# Patient Record
Sex: Male | Born: 1960
Health system: Southern US, Community
[De-identification: ages and names within clinical notes are randomized; demographics above are authoritative.]

## PROBLEM LIST (undated history)

## (undated) DIAGNOSIS — F99 Mental disorder, not otherwise specified: Secondary | ICD-10-CM

## (undated) DIAGNOSIS — I350 Nonrheumatic aortic (valve) stenosis: Secondary | ICD-10-CM

## (undated) DIAGNOSIS — I82409 Acute embolism and thrombosis of unspecified deep veins of unspecified lower extremity: Secondary | ICD-10-CM

## (undated) DIAGNOSIS — I259 Chronic ischemic heart disease, unspecified: Secondary | ICD-10-CM

## (undated) DIAGNOSIS — I2510 Atherosclerotic heart disease of native coronary artery without angina pectoris: Secondary | ICD-10-CM

## (undated) DIAGNOSIS — D6851 Activated protein C resistance: Secondary | ICD-10-CM

## (undated) DIAGNOSIS — J45909 Unspecified asthma, uncomplicated: Secondary | ICD-10-CM

## (undated) DIAGNOSIS — G9050 Complex regional pain syndrome I, unspecified: Secondary | ICD-10-CM

## (undated) DIAGNOSIS — K279 Peptic ulcer, site unspecified, unspecified as acute or chronic, without hemorrhage or perforation: Secondary | ICD-10-CM

## (undated) DIAGNOSIS — I10 Essential (primary) hypertension: Secondary | ICD-10-CM

## (undated) DIAGNOSIS — R945 Abnormal results of liver function studies: Secondary | ICD-10-CM

## (undated) DIAGNOSIS — R7989 Other specified abnormal findings of blood chemistry: Secondary | ICD-10-CM

## (undated) DIAGNOSIS — D649 Anemia, unspecified: Secondary | ICD-10-CM

## (undated) HISTORY — DX: Activated protein C resistance: D68.51

## (undated) HISTORY — DX: Peptic ulcer, site unspecified, unspecified as acute or chronic, without hemorrhage or perforation: K27.9

## (undated) HISTORY — DX: Essential (primary) hypertension: I10

## (undated) HISTORY — DX: Acute embolism and thrombosis of unspecified deep veins of unspecified lower extremity: I82.409

## (undated) HISTORY — PX: CORONARY ARTERY BYPASS GRAFT: SHX141

## (undated) HISTORY — DX: Complex regional pain syndrome I, unspecified: G90.50

## (undated) HISTORY — DX: Atherosclerotic heart disease of native coronary artery without angina pectoris: I25.10

## (undated) HISTORY — DX: Mental disorder, not otherwise specified: F99

## (undated) HISTORY — DX: Chronic ischemic heart disease, unspecified: I25.9

## (undated) HISTORY — DX: Abnormal results of liver function studies: R94.5

## (undated) HISTORY — DX: Unspecified asthma, uncomplicated: J45.909

## (undated) HISTORY — DX: Other specified abnormal findings of blood chemistry: R79.89

## (undated) HISTORY — PX: OTHER SURGICAL HISTORY: SHX169

## (undated) HISTORY — DX: Nonrheumatic aortic (valve) stenosis: I35.0

## (undated) HISTORY — DX: Anemia, unspecified: D64.9

---

## 2004-10-28 DIAGNOSIS — I82409 Acute embolism and thrombosis of unspecified deep veins of unspecified lower extremity: Secondary | ICD-10-CM

## 2004-10-28 HISTORY — DX: Acute embolism and thrombosis of unspecified deep veins of unspecified lower extremity: I82.409

## 2006-10-28 HISTORY — PX: GALLBLADDER SURGERY: SHX652

## 2011-07-22 DIAGNOSIS — Z86711 Personal history of pulmonary embolism: Secondary | ICD-10-CM | POA: Insufficient documentation

## 2011-10-17 DIAGNOSIS — D61818 Other pancytopenia: Secondary | ICD-10-CM | POA: Insufficient documentation

## 2011-10-25 DIAGNOSIS — D500 Iron deficiency anemia secondary to blood loss (chronic): Secondary | ICD-10-CM | POA: Insufficient documentation

## 2011-12-26 DIAGNOSIS — E610 Copper deficiency: Secondary | ICD-10-CM

## 2012-05-06 DIAGNOSIS — Z9889 Other specified postprocedural states: Secondary | ICD-10-CM | POA: Insufficient documentation

## 2012-07-15 HISTORY — PX: AORTIC VALVE REPLACEMENT: SHX41

## 2012-07-21 DIAGNOSIS — Z7901 Long term (current) use of anticoagulants: Secondary | ICD-10-CM | POA: Insufficient documentation

## 2012-07-23 DIAGNOSIS — Z951 Presence of aortocoronary bypass graft: Secondary | ICD-10-CM | POA: Insufficient documentation

## 2012-08-17 ENCOUNTER — Encounter: Payer: Self-pay | Admitting: Cardiology

## 2012-08-17 ENCOUNTER — Ambulatory Visit (INDEPENDENT_AMBULATORY_CARE_PROVIDER_SITE_OTHER): Payer: Self-pay | Admitting: Cardiology

## 2012-08-17 VITALS — BP 110/62 | HR 64 | Ht 68.5 in | Wt 167.0 lb

## 2012-08-17 DIAGNOSIS — I4891 Unspecified atrial fibrillation: Secondary | ICD-10-CM

## 2012-08-17 DIAGNOSIS — Z951 Presence of aortocoronary bypass graft: Secondary | ICD-10-CM

## 2012-08-17 DIAGNOSIS — Z954 Presence of other heart-valve replacement: Secondary | ICD-10-CM

## 2012-08-17 DIAGNOSIS — I483 Typical atrial flutter: Secondary | ICD-10-CM | POA: Insufficient documentation

## 2012-08-17 DIAGNOSIS — I25810 Atherosclerosis of coronary artery bypass graft(s) without angina pectoris: Secondary | ICD-10-CM

## 2012-08-17 DIAGNOSIS — D682 Hereditary deficiency of other clotting factors: Secondary | ICD-10-CM | POA: Insufficient documentation

## 2012-08-17 DIAGNOSIS — Z952 Presence of prosthetic heart valve: Secondary | ICD-10-CM | POA: Insufficient documentation

## 2012-08-17 DIAGNOSIS — D6859 Other primary thrombophilia: Secondary | ICD-10-CM | POA: Insufficient documentation

## 2012-08-17 DIAGNOSIS — I359 Nonrheumatic aortic valve disorder, unspecified: Secondary | ICD-10-CM

## 2012-08-17 NOTE — Patient Instructions (Addendum)
Reduce ASA to 81 mg daily  Stop amiodarone.  We will schedule you for an echocardiogram  You need routine SBE prophylaxis/ antibiotics for dental work  We will refer to Cardiac Rehab.

## 2012-08-17 NOTE — Progress Notes (Signed)
Frederick Payne Date of Birth: December 23, 1960 Medical Record #[id number]  History of Present Illness:  Frederick Payne is seen at the request of Dr. Gracy Bruins in [place] for evaluation of aortic valve disease. Frederick Payne is a pleasant 51 year old white male who has a complex medical history. He has a known history of aortic valve disease dating back at least 15 years. This past year he began experiencing symptoms of increasing dyspnea, fatigue, and then developed syncope. He was found to have severe aortic stenosis and aortic insufficiency. He was ultimately evaluated at [facility] in [facility] and underwent aortic valve replacement with a #27 mm St. Jude valve and single coronary bypass surgery. He also had some debridement of his mitral annulus which was calcified. Following surgery he had some postoperative atrial fibrillation. This resolved with amiodarone therapy. He had a single LIMA graft to the LAD for a 70% stenosis. He was started on Coumadin. He is still being bridged with Lovenox. His Coumadin is being followed by [facility]. Patient reports he is making a good recovery. He still has not regained his physical energy. He denies any significant chest pain, shortness of breath, or palpitations. He has had no recurrent syncopal episodes.  Current Outpatient Prescriptions on File Prior to Visit  Medication Sig Dispense Refill  . amiodarone (PACERONE) 200 MG tablet Take 200 mg by mouth daily.      [name] aspirin 325 MG tablet Take 325 mg by mouth daily.      . Copper Gluconate (COPPER CAPS) 2 MG CAPS Take by mouth every evening.      . docusate sodium (COLACE) 100 MG capsule Take 100 mg by mouth 2 (two) times daily.      [name] enoxaparin (LOVENOX) 80 MG/0.8ML injection Inject into the skin.      . ferrous sulfate 325 (65 FE) MG tablet Take 325 mg by mouth daily.      [name] gabapentin (NEURONTIN) 600 MG tablet Take 600 mg by mouth 4 (four) times daily.      [name]  L-Methylfolate-Algae (DEPLIN 15 PO) Take by mouth 2 (two) times daily.      [name] levalbuterol (XOPENEX HFA) 45 MCG/ACT inhaler Inhale 1 puff into the lungs every 4 (four) hours as needed.      . metoprolol succinate (TOPROL-XL) 50 MG 24 hr tablet Take 50 mg by mouth daily. 1.5 tablet once a day in the evening      . morphine (KADIAN) 60 MG 24 hr capsule Take 60 mg by mouth as directed. 1 capsule by mouth three times a day      . Multiple Vitamins-Minerals (ONE-A-DAY MENS 50+ ADVANTAGE PO) Take by mouth daily.      . ondansetron (ZOFRAN) 4 MG tablet Take 4 mg by mouth daily.      [name] oxyCODONE (ROXICODONE) 15 MG immediate release tablet Take 15 mg by mouth as directed.      . Polyethylene Glycol 3350 POWD by Does not apply route daily.      . S-Adenosylmethionine (SAM-E) 400 MG TABS Take by mouth every morning.      . Vilazodone HCl (VIIBRYD) 40 MG TABS Take by mouth daily.      [name] warfarin (COUMADIN) 5 MG tablet Take 5 mg by mouth daily.      . LamoTRIgine 50 MG TB24 Take 50 mg by mouth every evening.        Allergies  Allergen Reactions  . Contrast Media (Iodinated Diagnostic Agents)   .  Pectin   . Shellfish Allergy   . Sulfa Antibiotics   . Tylenol (Acetaminophen)     Past Medical History  Diagnosis Date  . Hypertension   . Psychiatric disorder     Underlying  . Deep vein thrombosis 2006    After a broken ankle--recently diagnosed with reflex sympathetic dystrophy of the left lower extremity  . Aortic stenosis     Had surgery performed  . Ischemic heart disease   . CAD (coronary artery disease)   . Factor V Leiden   . Chronic anemia   . Reflex sympathetic dystrophy   . PUD (peptic ulcer disease)     with bleeding    Past Surgical History  Procedure Date  . Gallbladder surgery 2008  . Aortic valve replacement 07/15/12    27 mm St Jude  . Coronary artery bypass graft     x1  . Gastrectomy and vagotomy   . Greenfield filter     History  Smoking status  . Former Smoker    Smokeless tobacco  . Not on file  Comment: quit 15 + yrs ago    History  Alcohol Use No    Family History  Problem Relation Age of Onset  . Stroke    . Heart attack    . Hypertension    . Factor V Leiden deficiency      family history     Review of Systems: The review of systems is positive for history of extensive left lower extremity DVT in the past. He reports a factor V Leiden deficiency. He had refused Coumadin therapy in the past. He has a Greenfield filter in place. With his DVT he developed a reflex sympathetic dystrophy and has had significant pain in his left lower extremity. He is followed in a pain clinic. He also has a history of significant peptic ulcer disease with prior gastrointestinal bleeding. He's had previous gastrectomy. He has chronic anemia that has required transfusion and iron repletion in the past. He is followed by hematologist. He has been diagnosed with both a Copper and vitamin B deficiency. He apparently has some history of psychiatric illness. He is disabled from his chronic pain. All other systems were reviewed and are negative.  Physical Exam: BP 110/62  Pulse 64  Ht 5' 8.5" (1.74 m)  Wt 75.751 kg (167 lb)  BMI 25.02 kg/m2 He is a pleasant, thin white male in no acute distress. Skin is warm and dry. HEENT exam is unremarkable. Pupils are equal round and reactive to light accommodation. Extraocular movements are full. Sclera are clear. Oropharynx is clear. Dentition is in good repair. Neck is supple without JVD, adenopathy, thyromegaly, or bruits. Carotid upstrokes are good. Lungs are clear. Cardiac exam reveals a regular rate and rhythm without gallop or murmur. He has a good mechanical aortic valve click. Sternal incision is healing well and sternum is stable. Abdomen is soft and nontender with old gastrectomy scar. There are no masses or bruits. Femoral and pedal pulses are 2+ and symmetric. He has no significant edema. He does have significant  discomfort to palpation of his left lower extremity. He is alert and oriented x3. Cranial nerves II through XII are intact. Mood is appropriate. LABORATORY DATA: ECG today demonstrates sinus bradycardia with rate 56 beats per minute. He has a first degree AV block. There are T wave changes consistent with inferior and anterior lateral ischemia.  Assessment / Plan: 1. Aortic valve disease with severe aortic stenosis and insufficiency.  Now status post aortic valve replacement with #27 mm St. Jude mechanical valve. Postoperative recovery is good. He will need to stay on lifelong Coumadin. He will need routine SBE  prophylaxis. We will obtain an echocardiogram as a new baseline. We will reduce his aspirin to 81 mg daily.  2. Coronary disease status post single vessel LIMA graft to the LAD. I recommended formal cardiac rehabilitation.  3. History of DVT with factor V Leiden deficiency.  4. Reflex sympathetic dystrophy left lower extremity.  5. Chronic anemia.  6. History of peptic ulcer disease status post gastrectomy.  7. Postoperative atrial fibrillation-resolved. We will stop amiodarone at this point and observe.  8. History of syncope. I suspect this was related to his significant aortic valve disease. I recommended that he has no further syncope he may resume driving in 2 months.

## 2012-08-25 ENCOUNTER — Ambulatory Visit (HOSPITAL_COMMUNITY): Payer: Self-pay | Attending: Cardiology | Admitting: Radiology

## 2012-08-25 DIAGNOSIS — I379 Nonrheumatic pulmonary valve disorder, unspecified: Secondary | ICD-10-CM | POA: Insufficient documentation

## 2012-08-25 DIAGNOSIS — I4891 Unspecified atrial fibrillation: Secondary | ICD-10-CM | POA: Insufficient documentation

## 2012-08-25 DIAGNOSIS — I369 Nonrheumatic tricuspid valve disorder, unspecified: Secondary | ICD-10-CM | POA: Insufficient documentation

## 2012-08-25 DIAGNOSIS — I2510 Atherosclerotic heart disease of native coronary artery without angina pectoris: Secondary | ICD-10-CM | POA: Insufficient documentation

## 2012-08-25 DIAGNOSIS — I10 Essential (primary) hypertension: Secondary | ICD-10-CM | POA: Insufficient documentation

## 2012-08-25 DIAGNOSIS — I359 Nonrheumatic aortic valve disorder, unspecified: Secondary | ICD-10-CM

## 2012-08-25 DIAGNOSIS — I25810 Atherosclerosis of coronary artery bypass graft(s) without angina pectoris: Secondary | ICD-10-CM

## 2012-08-25 NOTE — Progress Notes (Signed)
Echocardiogram performed.  

## 2012-08-26 NOTE — Addendum Note (Signed)
Addended by: Reine Just on: 08/26/2012 04:29 PM   Modules accepted: Orders

## 2012-08-27 ENCOUNTER — Encounter: Payer: Self-pay | Admitting: Cardiology

## 2012-08-31 ENCOUNTER — Telehealth: Payer: Self-pay | Admitting: Cardiology

## 2012-08-31 NOTE — Telephone Encounter (Signed)
Patient would like to know when he can travel out of the country. Pt had open heart surgery on september 17 th 2013. Pt is planning to travel on January 2014.

## 2012-08-31 NOTE — Telephone Encounter (Signed)
New problem:  When can he travel out of country?

## 2012-08-31 NOTE — Telephone Encounter (Signed)
He should be fine to travel out of the country in Jan.  Peter [place] MD, [facility]

## 2012-09-01 NOTE — Telephone Encounter (Signed)
Patient called was told okay with Dr.Jordan to travel out of country in 1/14.

## 2012-09-09 DIAGNOSIS — Z7901 Long term (current) use of anticoagulants: Secondary | ICD-10-CM

## 2012-09-09 DIAGNOSIS — Z5181 Encounter for therapeutic drug level monitoring: Secondary | ICD-10-CM | POA: Insufficient documentation

## 2012-09-15 ENCOUNTER — Telehealth: Payer: Self-pay | Admitting: Cardiology

## 2012-09-15 NOTE — Telephone Encounter (Signed)
Pt had valve replaced and has some questions

## 2012-09-15 NOTE — Telephone Encounter (Signed)
Spoke to patient he stated he is having trouble sleeping at night.States he is not use to his new valve, makes too much noise.Advised to call PCP for a sleeping medication.

## 2012-09-16 ENCOUNTER — Telehealth: Payer: Self-pay | Admitting: Cardiology

## 2012-09-16 ENCOUNTER — Ambulatory Visit: Payer: Self-pay | Admitting: Cardiology

## 2012-09-16 NOTE — Telephone Encounter (Signed)
Pt having a dull pain in his chest since yesterday and he is not sure if it is from his cough or what

## 2012-09-16 NOTE — Telephone Encounter (Signed)
See other phone note from Deliah Goody RN

## 2012-09-16 NOTE — Telephone Encounter (Signed)
Spoke with pt, he has been congested for several weeks. He has had a cough for 3-4 weeks, productive of milk colored to green- brown sputum. He has a constant dull pain in his chest since yesterday that is severe when he coughs. He is coughing over the phone and is wheezing. Pt instructed to call PCP to be seen for antibiotics. Pt agreed with this plan.

## 2012-10-06 ENCOUNTER — Other Ambulatory Visit: Payer: Self-pay | Admitting: *Deleted

## 2012-10-06 MED ORDER — METOPROLOL SUCCINATE ER 50 MG PO TB24: 50.0000 mg | ORAL_TABLET | Freq: Every day | ORAL | Status: DC

## 2012-11-18 ENCOUNTER — Ambulatory Visit (INDEPENDENT_AMBULATORY_CARE_PROVIDER_SITE_OTHER): Payer: Self-pay | Admitting: Cardiology

## 2012-11-18 ENCOUNTER — Encounter: Payer: Self-pay | Admitting: Cardiology

## 2012-11-18 VITALS — BP 122/78 | HR 74 | Ht 68.5 in | Wt 156.0 lb

## 2012-11-18 DIAGNOSIS — Z951 Presence of aortocoronary bypass graft: Secondary | ICD-10-CM

## 2012-11-18 DIAGNOSIS — Z952 Presence of prosthetic heart valve: Secondary | ICD-10-CM

## 2012-11-18 DIAGNOSIS — I25810 Atherosclerosis of coronary artery bypass graft(s) without angina pectoris: Secondary | ICD-10-CM

## 2012-11-18 DIAGNOSIS — Z954 Presence of other heart-valve replacement: Secondary | ICD-10-CM

## 2012-11-18 DIAGNOSIS — I4891 Unspecified atrial fibrillation: Secondary | ICD-10-CM

## 2012-11-18 NOTE — Progress Notes (Signed)
Micheal Cifelli Date of Birth: [birth year] Medical Record #[id number]  History of Present Illness:  Mr. Rubiano is seen for follow up of AV disease and CAD. He is s/p aortic valve replacement with a #27 mm St. Jude valve and single coronary bypass surgery. He also had some debridement of his mitral annulus which was calcified. Following surgery he had some postoperative atrial fibrillation. This resolved with amiodarone therapy. He had a single LIMA graft to the LAD for a 70% stenosis. He has made an excellent recovery. Amiodarone was stopped on his last visit. He reports his coumadin has been therapeutic. Feels HR is elevated in am. He does have chronic PVCs and is on metoprolol.  Current Outpatient Prescriptions on File Prior to Visit  Medication Sig Dispense Refill  . Copper Gluconate (COPPER CAPS) 2 MG CAPS Take by mouth every evening.      . ferrous sulfate 325 (65 FE) MG tablet Take 325 mg by mouth daily.      . fish oil-omega-3 fatty acids 1000 MG capsule Take 1 g by mouth daily.      [name] gabapentin (NEURONTIN) 600 MG tablet Take 600 mg by mouth 4 (four) times daily.      [name] L-Methylfolate-Algae (DEPLIN 15 PO) Take by mouth 2 (two) times daily.      [name] levalbuterol (XOPENEX HFA) 45 MCG/ACT inhaler Inhale 1 puff into the lungs every 4 (four) hours as needed.      . metoprolol succinate (TOPROL-XL) 50 MG 24 hr tablet Take 1 tablet (50 mg total) by mouth daily. 1.5 tablet once a day in the evening  45 tablet  6  . morphine (KADIAN) 60 MG 24 hr capsule Take 60 mg by mouth as directed. 1 capsule by mouth three times a day      . Multiple Vitamins-Minerals (ONE-A-DAY MENS 50+ ADVANTAGE PO) Take by mouth daily.      . ondansetron (ZOFRAN) 4 MG tablet Take 4 mg by mouth daily.      [name] oxyCODONE (ROXICODONE) 15 MG immediate release tablet Take 15 mg by mouth as directed.      [name] POLICOSANOL PO Take 20 mg by mouth daily.      . Polyethylene Glycol 3350 POWD by Does not apply route daily.      .  S-Adenosylmethionine (SAM-E) 400 MG TABS Take by mouth every morning.      . Vilazodone HCl (VIIBRYD) 40 MG TABS Take by mouth daily.      [name] warfarin (COUMADIN) 5 MG tablet Take 5 mg by mouth daily.        Allergies  Allergen Reactions  . Contrast Media (Iodinated Diagnostic Agents)   . Pectin   . Shellfish Allergy   . Sulfa Antibiotics   . Tylenol (Acetaminophen)     Past Medical History  Diagnosis Date  . Hypertension   . Psychiatric disorder     Underlying  . Deep vein thrombosis 2006    After a broken ankle--recently diagnosed with reflex sympathetic dystrophy of the left lower extremity  . Aortic stenosis     Had surgery performed  . Ischemic heart disease   . CAD (coronary artery disease)   . Factor V Leiden   . Chronic anemia   . Reflex sympathetic dystrophy   . PUD (peptic ulcer disease)     with bleeding    Past Surgical History  Procedure Date  . Gallbladder surgery 2008  . Aortic valve replacement 07/15/12    27  mm St Jude  . Coronary artery bypass graft     x1  . Gastrectomy and vagotomy   . Greenfield filter     History  Smoking status  . Former Smoker  Smokeless tobacco  . Not on file    Comment: quit 15 + yrs ago    History  Alcohol Use No    Family History  Problem Relation Age of Onset  . Stroke    . Heart attack    . Hypertension    . Factor V Leiden deficiency      family history     Review of Systems: The review of systems is positive for history of extensive left lower extremity DVT in the past. He reports a factor V Leiden deficiency. He complains of insomnia due to night terrors. 2 months ago he stopped his morphine which he had taken for RSD. He still takes oxycontin twice a day. All other systems were reviewed and are negative.  Physical Exam: BP 122/78  Pulse 74  Ht 5' 8.5" (1.74 m)  Wt 156 lb (70.761 kg)  BMI 23.37 kg/m2 He is a pleasant, thin white male in no acute distress. Skin is warm and dry. HEENT exam is  unremarkable. Pupils are equal round and reactive to light accommodation. Extraocular movements are full. Sclera are clear. Oropharynx is clear. Dentition is in good repair. Neck is supple without JVD, adenopathy, thyromegaly, or bruits. Carotid upstrokes are good. Lungs are clear. Cardiac exam reveals a regular rate and rhythm without gallop or murmur. He has a good mechanical aortic valve click. Sternal incision is healing well and sternum is stable. Abdomen is soft and nontender with old gastrectomy scar. There are no masses or bruits. Femoral and pedal pulses are 2+ and symmetric. He has no significant edema. He does have significant discomfort to palpation of his left lower extremity. He is alert and oriented x3. Cranial nerves II through XII are intact. Mood is appropriate. LABORATORY DATA: Transthoracic Echocardiography  Patient: Equan, Eisenzimmer MR #: [id number] Study Date: 08/25/2012 Gender: M Age: 52 Height: 172.7cm Weight: 75.8kg BSA: 1.10m^2 Pt. Status: Room:  ATTENDING [place], Mark Hassey ORDERING [place], Yer Olivencia REFERRING [place], Roquel Burgin SONOGRAPHER Luvenia Redden, RDCS PERFORMING [facility], [facility] cc: DR. Gracy Bruins in [place]  ------------------------------------------------------------ LV EF: 55% - 60%  ------------------------------------------------------------ Indications: 424.1 Aortic valve disorders.  ------------------------------------------------------------ History: PMH: Acquired from the patient and from the patient's chart. Atrial fibrillation. Coronary artery disease. Risk factors: Hypertension.  ------------------------------------------------------------ Study Conclusions  - Left ventricle: The cavity size was normal. Wall thickness was normal. Systolic function was normal. The estimated ejection fraction was in the range of 55% to 60%. Wall motion was normal; there were no regional wall motion abnormalities. Features are consistent with a  pseudonormal left ventricular filling pattern, with concomitant abnormal relaxation and increased filling pressure (grade 2 diastolic dysfunction). - Aortic valve: A mechanical prosthesis was present. - Left atrium: The atrium was mildly dilated.  ------------------------------------------------------------ Labs, prior tests, procedures, and surgery: Coronary artery bypass grafting. Aortic valve replacement with a . Jude Medical mechanical valve.  Transthoracic echocardiography. M-mode, complete 2D, spectral Doppler, and color Doppler. Height: Height: 172.7cm. Height: 68in. Weight: Weight: 75.8kg. Weight: 166.7lb. Body mass index: BMI: 25.4kg/m^2. Body surface area: BSA: 1.38m^2. Blood pressure: 110/62. Patient status: Outpatient. Location: [facility]  ------------------------------------------------------------  ------------------------------------------------------------ Left ventricle: The cavity size was normal. Wall thickness was normal. Systolic function was normal. The estimated ejection fraction was in the range of 55%  to 60%. Wall motion was normal; there were no regional wall motion abnormalities. Features are consistent with a pseudonormal left ventricular filling pattern, with concomitant abnormal relaxation and increased filling pressure (grade 2 diastolic dysfunction).  ------------------------------------------------------------ Aortic valve: A mechanical prosthesis was present. Doppler: Transvalvular velocity was within the normal range. There was no stenosis. No regurgitation. VTI ratio of LVOT to aortic valve: 0.63. Peak velocity ratio of LVOT to aortic valve: 0.54. Mean gradient: 5mm Hg (S). Peak gradient: 11mm Hg (S).  ------------------------------------------------------------ Aorta: Aortic root: The aortic root was mildly dilated.  ------------------------------------------------------------ Mitral valve: Mildly thickened leaflets .  Mobility was not restricted. Doppler: Transvalvular velocity was within the normal range. There was no evidence for stenosis. No regurgitation. Peak gradient: 2mm Hg (D).  ------------------------------------------------------------ Left atrium: The atrium was mildly dilated.  ------------------------------------------------------------ Right ventricle: The cavity size was normal. Systolic function was normal.  ------------------------------------------------------------ Pulmonic valve: Doppler: Transvalvular velocity was within the normal range. There was no evidence for stenosis. Trivial regurgitation.  ------------------------------------------------------------ Tricuspid valve: Structurally normal valve. Doppler: Transvalvular velocity was within the normal range. Mild regurgitation.  ------------------------------------------------------------ Pulmonary artery: Systolic pressure was within the normal range.  ------------------------------------------------------------ Right atrium: The atrium was normal in size.  ------------------------------------------------------------ Pericardium: There was no pericardial effusion.  ------------------------------------------------------------ Systemic veins: Inferior vena cava: The vessel was normal in size.  ------------------------------------------------------------  2D measurements Normal Doppler Normal Left ventricle measurements LVID ED, 53.3 mm 43-52 Main pulmonary chord, artery PLAX Pressure, S 12 mm =30 LVID ES, 31.8 mm 23-38 Hg chord, Left ventricle PLAX Ea, lat 11.4 cm/ ------- FS, 40 % >29 ann, tiss s chord, DP PLAX E/Ea, lat 6.71 ------- LVPW, ED 10.6 mm ------ ann, tiss IVS/LVPW 0.68 <1.3 DP ratio, ED Ea, med 5.37 cm/ ------- Ventricular septum ann, tiss s IVS, ED 7.25 mm ------ DP Aorta E/Ea, med 14.25 ------- Root 40 mm ------ ann, tiss diam, ED DP Left atrium LVOT AP dim 42.54 mm ------ Peak vel, S  89.5 cm/ ------- AP dim 2.25 cm/m^2 <2.2 s index VTI, S 17.3 cm ------- Aortic valve Peak vel, S 165 cm/ ------- s Mean vel, S 92.4 cm/ ------- s VTI, S 27.4 cm ------- Mean 5 mm ------- gradient, S Hg Peak 11 mm ------- gradient, S Hg VTI ratio 0.63 ------- LVOT/AV Peak vel 0.54 ------- ratio, LVOT/AV Mitral valve Peak E vel 76.5 cm/ ------- s Peak A vel 67.1 cm/ ------- s Deceleratio 243 ms 150-230 n time Peak 2 mm ------- gradient, D Hg Peak E/A 1.1 ------- ratio Tricuspid valve Regurg peak 132 cm/ ------- vel s Peak RV-RA 7 mm ------- gradient, S Hg Systemic veins Estimated 5 mm ------- CVP Hg Right ventricle Pressure, S 12 mm <30 Hg Sa vel, lat 10.7 cm/ ------- ann, tiss s DP  ------------------------------------------------------------ Prepared and Electronically Authenticated by  Olga Millers 2013-10-29T14:[id number]   Assessment / Plan: 1. Aortic valve disease with severe aortic stenosis and insufficiency. Now status post aortic valve replacement with #27 mm St. Jude mechanical valve. Patient has recovered completely.He will need to stay on lifelong Coumadin. He will need routine SBE  prophylaxis. I will follow up in 6 months.  2. Coronary disease status post single vessel LIMA graft to the LAD.   3. History of DVT with factor V Leiden deficiency.  4. Reflex sympathetic dystrophy left lower extremity.  5. Chronic anemia.  6. History of peptic ulcer disease status post gastrectomy.  7. Postoperative atrial fibrillation-resolved. No recurrence

## 2012-11-18 NOTE — Patient Instructions (Signed)
Continue your current therapy  I will see you again in 6 months.   

## 2012-11-19 ENCOUNTER — Ambulatory Visit: Payer: Self-pay | Admitting: Cardiology

## 2013-01-20 DIAGNOSIS — Z86718 Personal history of other venous thrombosis and embolism: Secondary | ICD-10-CM | POA: Insufficient documentation

## 2013-02-25 DIAGNOSIS — I87009 Postthrombotic syndrome without complications of unspecified extremity: Secondary | ICD-10-CM | POA: Insufficient documentation

## 2013-03-03 DIAGNOSIS — F329 Major depressive disorder, single episode, unspecified: Secondary | ICD-10-CM

## 2013-03-03 DIAGNOSIS — F325 Major depressive disorder, single episode, in full remission: Secondary | ICD-10-CM | POA: Insufficient documentation

## 2013-03-03 DIAGNOSIS — F4310 Post-traumatic stress disorder, unspecified: Secondary | ICD-10-CM | POA: Insufficient documentation

## 2013-04-06 ENCOUNTER — Telehealth: Payer: Self-pay | Admitting: Cardiology

## 2013-04-06 MED ORDER — AMOXICILLIN 500 MG PO CAPS: ORAL_CAPSULE | ORAL | Status: DC

## 2013-04-06 NOTE — Telephone Encounter (Signed)
Returned call to patient he has a tooth ache and may be having a root canal this afternoon.Amoxicillin 2 grams take 1 hour before dental work sent to pharmacy.

## 2013-04-06 NOTE — Telephone Encounter (Signed)
New Problem:    Patient called in because he had open heart surgery last year, and wanted to know if there was anything he needed to do prior to his emergency dental appointment.  Please call back.

## 2013-04-07 ENCOUNTER — Telehealth: Payer: Self-pay | Admitting: Cardiology

## 2013-04-07 NOTE — Telephone Encounter (Signed)
New problem     Pt has infection in jaw bone and wants to know what dr [place] wants to do

## 2013-04-07 NOTE — Telephone Encounter (Signed)
Returned call to patient he stated he has appointment with a oral surgeon 04/13/13.States dentist started him on PCN VK 500 mg every 6 hours for 12 days.Stated he has noticed his heart pounds at times since he has this infection.Advised to  keep appointment with Dr.Jordan 06/17/13 and call sooner if needed.

## 2013-05-04 DIAGNOSIS — M7711 Lateral epicondylitis, right elbow: Secondary | ICD-10-CM | POA: Insufficient documentation

## 2013-05-04 DIAGNOSIS — M25529 Pain in unspecified elbow: Secondary | ICD-10-CM | POA: Insufficient documentation

## 2013-06-10 ENCOUNTER — Telehealth: Payer: Self-pay | Admitting: Cardiology

## 2013-06-10 NOTE — Telephone Encounter (Signed)
New Problem    Pt states he has a BP of 162/110, pt wonders if this is a problem and if so what should he do.

## 2013-06-10 NOTE — Telephone Encounter (Signed)
Returned call to patient he stated his B/P has been elevated ranging 189/123 to 165/109 pulse 95.Stated he forgot to take metoprolol for 2 days.Stated he was recently in [facility] with a GI bleed, elevated INRs.Spoke to Dr.Jordan he advised to take metoprolol regularly and continue to monitor B/P and call back if continues to be elevated.  Spoke to patient he stated he checked B/P with a different B/P monitor and B/P 150/80.Stated his B/P monitor working incorrectly.Advised to monitor B/P and bring B/P diary to appointment with Dr.Jordan 06/17/13.

## 2013-06-11 ENCOUNTER — Telehealth: Payer: Self-pay | Admitting: Cardiology

## 2013-06-11 NOTE — Telephone Encounter (Signed)
Pt. Was called, left a message to call back.

## 2013-06-11 NOTE — Telephone Encounter (Signed)
New prob  Pt states he is currently at his doctors office and his INR is 7.5 and he is not feeling right. He said he is a little worried and would like to speak with a nurse.

## 2013-06-11 NOTE — Telephone Encounter (Signed)
Spoke with he states his BP has been running high ( no BP reading was given) and his heart is skipping. Pt states has not taking his Metoprolol,  because he is  Nauseas and is not able to hold the medication,and he vomits it. Pt states was in his PCP because he needs a prescription for phenergan, because he was send home from the hospital without this medication. Pt states  "feels like no one care because no one has prescribed Phenergan for my nauseas". Pt did see the PCP a few minutes ago and He prescribed phenergan which helps with the nauseas. Pt was made aware that if he can take the Metoprolol, it will help the BP and the heart rhythm. Pt was advice to take the phenergan now and take the Metoprolol as soon as he can. This medication will help his with BP and his heart rhythm. Pt also states he had his INR check in [facility] practice and the result is 7.5. The office drown blood work to send to [facility] to verify the INR results. Pt appears very nervous because he said he can get blood clots. Pt was assured that he can't get blood clots, because with the INR of 7.5 his blood is thin and the danger is bleeding. Pt was made aware that the [facility] practice should call him regarding the INR results and treat the results accordingly. Pt verbalized understanding.  Pt called back 30 minutes later and said that his BP was 131/84 now.

## 2013-06-15 NOTE — Telephone Encounter (Signed)
Patient called advised to keep appointment with Dr.Jordan 06/17/13.Stated he will keep appointment with Dr.Jordan and he is having a repeat INR done tomorrow 06/16/13 at PCP.

## 2013-06-17 ENCOUNTER — Encounter: Payer: Self-pay | Admitting: Cardiology

## 2013-06-17 ENCOUNTER — Ambulatory Visit (INDEPENDENT_AMBULATORY_CARE_PROVIDER_SITE_OTHER): Payer: Self-pay | Admitting: Cardiology

## 2013-06-17 VITALS — BP 110/58 | HR 67 | Ht 68.5 in | Wt 177.0 lb

## 2013-06-17 DIAGNOSIS — I25810 Atherosclerosis of coronary artery bypass graft(s) without angina pectoris: Secondary | ICD-10-CM

## 2013-06-17 DIAGNOSIS — I4891 Unspecified atrial fibrillation: Secondary | ICD-10-CM

## 2013-06-17 DIAGNOSIS — Z954 Presence of other heart-valve replacement: Secondary | ICD-10-CM

## 2013-06-17 DIAGNOSIS — Z952 Presence of prosthetic heart valve: Secondary | ICD-10-CM

## 2013-06-17 DIAGNOSIS — Z951 Presence of aortocoronary bypass graft: Secondary | ICD-10-CM

## 2013-06-17 MED ORDER — AMOXICILLIN 500 MG PO TABS: ORAL_TABLET | ORAL | Status: DC

## 2013-06-17 NOTE — Patient Instructions (Signed)
Continue your current therapy  I will see you in 6 months.   

## 2013-06-17 NOTE — Progress Notes (Signed)
Frederick Payne Date of Birth: Apr 01, 1961 Medical Record #[id number]  History of Present Illness:  [name] is seen for follow up of AV disease and CAD. He is s/p aortic valve replacement with a #27 mm St. Jude valve and single coronary bypass surgery. He also had some debridement of his mitral annulus which was calcified.  He had a single LIMA graft to the LAD for a 70% stenosis. He is on chronic Coumadin therapy. In July he was admitted to [facility] with an upper GI bleed. INR was greater than 20. He required blood and plasma transfusions. Endoscopy demonstrated a lesion at the anastomosis site of his Billroth II. This was cauterized. He has had no recurrent bleeding since then. He is back on Coumadin. He reports that it has been difficult regulating his INR. This is followed by his primary care. He also sees hematology and does require periodic transfusions and iron infusions for chronic anemia. He has a history of factor V Leiden deficiency.  Current Outpatient Prescriptions on File Prior to Visit  Medication Sig Dispense Refill  . Copper Gluconate (COPPER CAPS) 2 MG CAPS Take by mouth every evening.      . ferrous sulfate 325 (65 FE) MG tablet Take 325 mg by mouth daily.      . fish oil-omega-3 fatty acids 1000 MG capsule Take 1 g by mouth daily.      [name] L-Methylfolate-Algae (DEPLIN 15 PO) Take by mouth 2 (two) times daily.      [name] levalbuterol (XOPENEX HFA) 45 MCG/ACT inhaler Inhale 1 puff into the lungs every 4 (four) hours as needed.      . Multiple Vitamins-Minerals (ONE-A-DAY MENS 50+ ADVANTAGE PO) Take by mouth daily.      [name] oxyCODONE (ROXICODONE) 15 MG immediate release tablet Take 15 mg by mouth as directed.      [name] POLICOSANOL PO Take 20 mg by mouth daily.      . S-Adenosylmethionine (SAM-E) 400 MG TABS Take by mouth every morning.      . Vilazodone HCl (VIIBRYD) 40 MG TABS Take by mouth daily.      [name] warfarin (COUMADIN) 5 MG tablet Take 5 mg by mouth daily.       No current  facility-administered medications on file prior to visit.    Allergies  Allergen Reactions  . Contrast Media [Iodinated Diagnostic Agents]   . Pectin   . Shellfish Allergy   . Sulfa Antibiotics   . Tylenol [Acetaminophen]     Past Medical History  Diagnosis Date  . Hypertension   . Psychiatric disorder     Underlying  . Deep vein thrombosis 2006    After a broken ankle--recently diagnosed with reflex sympathetic dystrophy of the left lower extremity  . Aortic stenosis     Had surgery performed  . Ischemic heart disease   . CAD (coronary artery disease)   . Factor V Leiden   . Chronic anemia   . Reflex sympathetic dystrophy   . PUD (peptic ulcer disease)     with bleeding    Past Surgical History  Procedure Laterality Date  . Gallbladder surgery  2008  . Aortic valve replacement  07/15/12    27 mm St Jude  . Coronary artery bypass graft      x1  . Gastrectomy and vagotomy    . [place] filter      History  Smoking status  . Former Smoker  Smokeless tobacco  . Not on file  Comment: quit 15 + yrs ago    History  Alcohol Use No    Family History  Problem Relation Age of Onset  . Stroke    . Heart attack    . Hypertension    . Factor V Leiden deficiency      family history     Review of Systems: As noted in history of present illness. All other systems were reviewed and are negative.  Physical Exam: BP 110/58  Pulse 67  Ht 5' 8.5" (1.74 m)  Wt 177 lb (80.287 kg)  BMI 26.52 kg/m2  SpO2 97% He is a pleasant, thin white male in no acute distress. Skin is warm and dry. HEENT exam is unremarkable. Pupils are equal round and reactive to light accommodation. Extraocular movements are full. Sclera are clear. Oropharynx is clear. Dentition is in good repair. Neck is supple without JVD, adenopathy, thyromegaly, or bruits. Carotid upstrokes are good. Lungs are clear. Cardiac exam reveals a regular rate and rhythm without gallop or murmur. He has a good  mechanical aortic valve click.  Abdomen is soft and nontender with old gastrectomy scar. There are no masses or bruits. Femoral and pedal pulses are 2+ and symmetric. He has no significant edema. He does have significant discomfort to palpation of his left lower extremity. He is alert and oriented x3. Cranial nerves II through XII are intact. Mood is appropriate. LABORATORY DATA:  Assessment / Plan: 1. Aortic valve disease with severe aortic stenosis and insufficiency. Now status post aortic valve replacement with #27 mm St. Jude mechanical valve. Valve is functioning well. He will need to stay on lifelong Coumadin. He will need routine SBE  prophylaxis. I will follow up in 6 months.  2. Coronary disease status post single vessel LIMA graft to the LAD.   3. History of DVT with factor V Leiden deficiency.  4. Reflex sympathetic dystrophy left lower extremity.  5. Chronic anemia.  6. History of peptic ulcer disease status post gastrectomy. Recent GI bleed related to markedly elevated INR.  7. Postoperative atrial fibrillation-resolved. No recurrence

## 2013-08-18 ENCOUNTER — Telehealth: Payer: Self-pay | Admitting: Internal Medicine

## 2013-08-18 DIAGNOSIS — F1121 Opioid dependence, in remission: Secondary | ICD-10-CM | POA: Insufficient documentation

## 2013-08-18 DIAGNOSIS — T45511A Poisoning by anticoagulants, accidental (unintentional), initial encounter: Secondary | ICD-10-CM | POA: Insufficient documentation

## 2013-08-18 MED ORDER — AMOXICILLIN 500 MG PO TABS: ORAL_TABLET | ORAL | Status: DC

## 2013-08-18 NOTE — Telephone Encounter (Signed)
Open in error

## 2013-08-18 NOTE — Telephone Encounter (Signed)
Returned call to patient,amoxicillin 2 GM  One hour before dental procedure,rx faxed to pharmacy Patient advised to call PCP regarding shingles vaccine.

## 2013-08-18 NOTE — Telephone Encounter (Signed)
New Patient   Patient needs antibiotic for a upcoming dental appointment. He also has questions about shingles, his dentist has had singles and he's been exposed. Please call patient

## 2013-09-03 DIAGNOSIS — I82409 Acute embolism and thrombosis of unspecified deep veins of unspecified lower extremity: Secondary | ICD-10-CM | POA: Insufficient documentation

## 2013-09-10 ENCOUNTER — Ambulatory Visit: Payer: Self-pay | Admitting: Cardiology

## 2013-09-10 ENCOUNTER — Telehealth: Payer: Self-pay | Admitting: Cardiology

## 2013-09-10 MED ORDER — METOPROLOL SUCCINATE ER 100 MG PO TB24: 100.0000 mg | ORAL_TABLET | Freq: Every day | ORAL | Status: DC

## 2013-09-10 NOTE — Addendum Note (Signed)
Addended by: Meda Klinefelter D on: 09/10/2013 06:27 PM   Modules accepted: Orders, Medications

## 2013-09-10 NOTE — Telephone Encounter (Signed)
Returned call to patient he stated he missed his appointment this afternoon because he got stuck in traffic.Stated he was told next available appointment in January and would like to be seen sooner.Stated he is having frequent palpitations and wanted to know if he can increase Toprol.Stated he is currently taking Toprol XL 75 mg daily.Message sent to Dr.Jordan for advice.

## 2013-09-10 NOTE — Telephone Encounter (Signed)
New Problem:  Pt states he would like to be worked in before Dr. Elvis Coil next appt in Jan. Pt states he has been having multiple episodes of PVC's and he has been taking toprolol 75 mg.. Please advise pt

## 2013-09-10 NOTE — Telephone Encounter (Signed)
Spoke to patient Dr.Jordan advised increase metoprolol to 100 mg daily.

## 2013-09-10 NOTE — Telephone Encounter (Signed)
He can increase metoprolol to 100 mg daily.  Ransom Nickson [place] MD, [facility]

## 2013-09-14 NOTE — Telephone Encounter (Signed)
Patient called no answer.Left message on personal voice mail call me back to schedule appointment with Dr.Jordan.

## 2013-09-20 NOTE — Telephone Encounter (Signed)
Patient called appointment scheduled with Dr.Jordan 11/24/13 at 3:00 pm.Advised to call sooner if needed.

## 2013-10-20 DIAGNOSIS — F5104 Psychophysiologic insomnia: Secondary | ICD-10-CM | POA: Insufficient documentation

## 2013-11-19 ENCOUNTER — Telehealth: Payer: Self-pay

## 2013-11-19 MED ORDER — METOPROLOL SUCCINATE ER 100 MG PO TB24: ORAL_TABLET | ORAL | Status: DC

## 2013-11-19 NOTE — Telephone Encounter (Signed)
Received Prior Authorization Form from [facility] for metoprolol 100 mg.[employer] called at # [phone], no PA needed advised to use brand name Toprol XL 100 mg.Prescription sent to pharmacy.

## 2013-11-24 ENCOUNTER — Ambulatory Visit: Payer: Self-pay | Admitting: Cardiology

## 2013-11-25 ENCOUNTER — Encounter: Payer: Self-pay | Admitting: Cardiology

## 2014-01-28 DIAGNOSIS — G8929 Other chronic pain: Secondary | ICD-10-CM

## 2014-02-08 ENCOUNTER — Ambulatory Visit (INDEPENDENT_AMBULATORY_CARE_PROVIDER_SITE_OTHER): Payer: Medicaid Other | Admitting: Family Medicine

## 2014-02-08 ENCOUNTER — Encounter: Payer: Self-pay | Admitting: Family Medicine

## 2014-02-08 ENCOUNTER — Telehealth: Payer: Self-pay | Admitting: Family Medicine

## 2014-02-08 ENCOUNTER — Other Ambulatory Visit: Payer: Self-pay

## 2014-02-08 VITALS — BP 120/90 | HR 66 | Ht 68.0 in | Wt 181.0 lb

## 2014-02-08 DIAGNOSIS — Z952 Presence of prosthetic heart valve: Secondary | ICD-10-CM

## 2014-02-08 DIAGNOSIS — J45909 Unspecified asthma, uncomplicated: Secondary | ICD-10-CM

## 2014-02-08 DIAGNOSIS — Z86711 Personal history of pulmonary embolism: Secondary | ICD-10-CM

## 2014-02-08 DIAGNOSIS — D649 Anemia, unspecified: Secondary | ICD-10-CM

## 2014-02-08 DIAGNOSIS — J309 Allergic rhinitis, unspecified: Secondary | ICD-10-CM

## 2014-02-08 DIAGNOSIS — G90522 Complex regional pain syndrome I of left lower limb: Secondary | ICD-10-CM | POA: Insufficient documentation

## 2014-02-08 DIAGNOSIS — Z951 Presence of aortocoronary bypass graft: Secondary | ICD-10-CM

## 2014-02-08 DIAGNOSIS — G90529 Complex regional pain syndrome I of unspecified lower limb: Secondary | ICD-10-CM

## 2014-02-08 DIAGNOSIS — I25810 Atherosclerosis of coronary artery bypass graft(s) without angina pectoris: Secondary | ICD-10-CM

## 2014-02-08 DIAGNOSIS — Z954 Presence of other heart-valve replacement: Secondary | ICD-10-CM

## 2014-02-08 DIAGNOSIS — Z7901 Long term (current) use of anticoagulants: Secondary | ICD-10-CM

## 2014-02-08 DIAGNOSIS — D6851 Activated protein C resistance: Secondary | ICD-10-CM

## 2014-02-08 DIAGNOSIS — D6859 Other primary thrombophilia: Secondary | ICD-10-CM

## 2014-02-08 LAB — PROTIME-INR
INR: 1.75 — ABNORMAL HIGH (ref <1.50)
Prothrombin Time: 19.8 seconds — ABNORMAL HIGH (ref 11.6–15.2)

## 2014-02-08 NOTE — Progress Notes (Signed)
Subjective:    Patient ID: Frederick Payne, male    DOB: 07-Jan-1961, 53 y.o.   MRN: [id number]  HPI He is here for a get acquainted visit. He has a very long and complicated history. He does have underlying coronary artery disease with previous surgery. He has had aortic valve replacement and apparently mitral valve repair. Set bypass surgery. He has a history of DVT/PE and apparently has a filter in his IVC. He has a history of factor V Leiden. He presently is on Coumadin and states that this has been very difficult to control. He has an underlying history of allergic rhinitis and is using Allegra. He occasionally has asthma and uses Xopenex. He is on multiple medications listed in the chart. He last saw his cardiologist in August. He is followed for anemia and occasionally gets IV iron. This is to a hematologist in [place]. His react surgery was performed near [place]. Presently his Coumadin dosing is 10 mg 5 times a week and 12 mg. He usually takes 12 mg on Thursday and Saturday. He would like to switch to my care and also get hematology work done here. He presently is taking Neurontin 3 pills of unknown strength at night as well as tramadol for his RSD pain which is on the left. He would like to switch to a pain clinic here and work on more definitive care for his RSD pain rather than strictly pain medications which she apparently has weaned himself off of.. He has not had his PT/INR checked in several months. The machine was ordered for him by his hematologist to check his PT/INR more regularly due to his being very labile.   Review of Systems     Objective:   Physical Exam Alert and in no distress. Cardiac exam does show a loud S2 but regular rhythm. Lungs are clear to auscultation.       Assessment & Plan:  CAD (coronary artery disease) of artery bypass graft  Long term (current) use of anticoagulants - Plan: Protime-INR  S/P AVR  S/P CABG (coronary artery bypass  graft)  Allergic rhinitis  Asthma with allergic rhinitis  Anemia  Factor V Leiden  RSD lower limb  History of pulmonary embolus (PE)  PT/INR is 1.75. I will  the 12 mg dosing on Wednesday and Saturday and recheck his PT/INR on Monday. he will get his medical records from the hematologist and is from the pain clinic. I will get him in with hematology and attempt to get him in at a pain clinic with the idea of working more on his RSD.

## 2014-02-08 NOTE — Patient Instructions (Signed)
As for your medical records from the pain clinic and hematology didn't give me a call and I will set up appointments.

## 2014-02-08 NOTE — Telephone Encounter (Signed)
lm

## 2014-02-14 ENCOUNTER — Other Ambulatory Visit: Payer: Medicaid Other

## 2014-03-31 ENCOUNTER — Ambulatory Visit: Payer: Self-pay | Admitting: Cardiology

## 2014-04-05 ENCOUNTER — Encounter: Payer: Self-pay | Admitting: Cardiology

## 2014-04-21 ENCOUNTER — Telehealth: Payer: Self-pay | Admitting: Family Medicine

## 2014-04-21 NOTE — Telephone Encounter (Signed)
Set this up as a stat with Denice Paradise

## 2014-04-21 NOTE — Telephone Encounter (Signed)
Make sure he is taking up to 8 per day not the recommended 4. Let me know if there are other questions

## 2014-04-21 NOTE — Telephone Encounter (Signed)
Pt states he wants to have INR drawn, he had one drawn on 02/08/14 is it ok for him to have another drawn?

## 2014-04-22 ENCOUNTER — Other Ambulatory Visit: Payer: Self-pay

## 2014-04-22 DIAGNOSIS — Z7901 Long term (current) use of anticoagulants: Secondary | ICD-10-CM

## 2014-04-22 NOTE — Telephone Encounter (Signed)
Lab order put in, left message on pt VM notifiying him

## 2014-06-07 ENCOUNTER — Encounter: Payer: Self-pay | Admitting: Family Medicine

## 2014-06-07 ENCOUNTER — Ambulatory Visit (INDEPENDENT_AMBULATORY_CARE_PROVIDER_SITE_OTHER): Payer: Medicaid Other | Admitting: Family Medicine

## 2014-06-07 VITALS — BP 100/60 | HR 62 | Wt 195.0 lb

## 2014-06-07 DIAGNOSIS — D6851 Activated protein C resistance: Secondary | ICD-10-CM

## 2014-06-07 DIAGNOSIS — Z954 Presence of other heart-valve replacement: Secondary | ICD-10-CM

## 2014-06-07 DIAGNOSIS — S0230XA Fracture of orbital floor, unspecified side, initial encounter for closed fracture: Secondary | ICD-10-CM

## 2014-06-07 DIAGNOSIS — D6859 Other primary thrombophilia: Secondary | ICD-10-CM

## 2014-06-07 DIAGNOSIS — Z952 Presence of prosthetic heart valve: Secondary | ICD-10-CM

## 2014-06-07 MED ORDER — HYDROCODONE-ACETAMINOPHEN 5-325 MG PO TABS: 1.0000 | ORAL_TABLET | Freq: Four times a day (QID) | ORAL | Status: DC | PRN
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
## 2014-06-07 NOTE — Patient Instructions (Signed)
Dr. Clent Jacks. [address]

## 2014-06-14 ENCOUNTER — Ambulatory Visit (INDEPENDENT_AMBULATORY_CARE_PROVIDER_SITE_OTHER): Payer: Medicaid Other | Admitting: Family Medicine

## 2014-06-14 ENCOUNTER — Encounter: Payer: Self-pay | Admitting: Family Medicine

## 2014-06-14 VITALS — BP 114/70

## 2014-06-14 DIAGNOSIS — Z4802 Encounter for removal of sutures: Secondary | ICD-10-CM

## 2014-06-14 NOTE — Patient Instructions (Signed)
Moist heat 20 minutes 3 times per day

## 2014-06-14 NOTE — Progress Notes (Signed)
Subjective:    Patient ID: Frederick Payne, male    DOB: 1961-01-20, 53 y.o.   MRN: [id number]  HPI He is here for followup visit. He was seen today in [place] by ophthalmology. He is to get the CT scan and have it reevaluated for possible surgical repair. He is having much less difficulty with the swelling but has noted ecchymosis down to his anterior chest and on his shoulders especially at left a.c. joint area. He still has sutures placed. He also complains of continued slight bloody drainage in the posterior pharyngeal area.   Review of Systems     Objective:   Physical Exam Exam of the face shows much less swollen but still unable to open his eye. Swelling with ecchymosis is noted in the entire area that goes to his anterior chest and on his left shoulder with some tenderness to palpation over the a.c. joint.       Assessment & Plan:  Visit for suture removal  4 sutures are removed 3 were left in place since they were embedded in a scab. He is to use warm soaks to this area as well as any ecchymotic area 20 minutes 3 times per day to help with the healing process. Return here Thursday for removal of the other sutures.

## 2014-06-17 ENCOUNTER — Ambulatory Visit (INDEPENDENT_AMBULATORY_CARE_PROVIDER_SITE_OTHER): Payer: Medicaid Other | Admitting: Family Medicine

## 2014-06-17 ENCOUNTER — Encounter: Payer: Self-pay | Admitting: Family Medicine

## 2014-06-17 VITALS — BP 100/60 | HR 70

## 2014-06-17 DIAGNOSIS — Z4802 Encounter for removal of sutures: Secondary | ICD-10-CM

## 2014-06-18 NOTE — Progress Notes (Signed)
Subjective:    Patient ID: Frederick Payne, male    DOB: April 03, 1961, 53 y.o.   MRN: [id number]  HPI He is here for suture removal. He still has 3 sutures in the left eyebrow area. On his last visit last him to soak the area to get rid of some dried blood.   Review of Systems     Objective:   Physical Exam Alert and in no distress. The ecchymosis around the eyes and upper chest is slowly diminishing. 3 sutures were removed with no difficulty. He did complain of slight discomfort upon removal. Exam of the showed good it motion with some subconjunctival hemorrhaging noted.       Assessment & Plan:  Visit for suture removal  encouraged him to followup with ophthalmology and work on all his eye as much as he can

## 2014-07-06 ENCOUNTER — Telehealth: Payer: Self-pay | Admitting: Internal Medicine

## 2014-07-06 MED ORDER — TRAMADOL HCL 50 MG PO TABS: 100.0000 mg | ORAL_TABLET | Freq: Three times a day (TID) | ORAL | Status: DC

## 2014-07-06 MED ORDER — GABAPENTIN (ONCE-DAILY) 600 MG PO TABS: 3.0000 | ORAL_TABLET | Freq: Every day | ORAL | Status: DC

## 2014-07-06 NOTE — Telephone Encounter (Signed)
Pt needs a refill on tramadol 50mg - take 2 tablets three times daily, and Gralise ER 600mg - take 3 tablets at bedtime. Send to [facility] in [place]

## 2014-07-06 NOTE — Telephone Encounter (Signed)
Had to leave a message on the answer machine with info for tramadol

## 2014-07-06 NOTE — Telephone Encounter (Signed)
Call in the tramadol

## 2014-07-07 ENCOUNTER — Telehealth: Payer: Self-pay | Admitting: Family Medicine

## 2014-07-07 NOTE — Telephone Encounter (Signed)
lm

## 2014-07-22 ENCOUNTER — Other Ambulatory Visit: Payer: Self-pay | Admitting: Cardiology

## 2014-07-25 DIAGNOSIS — E876 Hypokalemia: Secondary | ICD-10-CM | POA: Insufficient documentation

## 2014-07-25 DIAGNOSIS — R112 Nausea with vomiting, unspecified: Secondary | ICD-10-CM | POA: Insufficient documentation

## 2014-07-25 DIAGNOSIS — R1031 Right lower quadrant pain: Secondary | ICD-10-CM | POA: Insufficient documentation

## 2014-07-25 DIAGNOSIS — E872 Acidosis, unspecified: Secondary | ICD-10-CM | POA: Insufficient documentation

## 2014-07-25 DIAGNOSIS — K852 Alcohol induced acute pancreatitis without necrosis or infection: Secondary | ICD-10-CM | POA: Insufficient documentation

## 2014-07-25 DIAGNOSIS — E871 Hypo-osmolality and hyponatremia: Secondary | ICD-10-CM | POA: Insufficient documentation

## 2014-07-25 DIAGNOSIS — E860 Dehydration: Secondary | ICD-10-CM | POA: Insufficient documentation

## 2014-07-25 DIAGNOSIS — D696 Thrombocytopenia, unspecified: Secondary | ICD-10-CM | POA: Insufficient documentation

## 2014-07-25 DIAGNOSIS — R197 Diarrhea, unspecified: Secondary | ICD-10-CM | POA: Insufficient documentation

## 2014-08-02 ENCOUNTER — Encounter: Payer: Self-pay | Admitting: Family Medicine

## 2014-08-02 ENCOUNTER — Ambulatory Visit (INDEPENDENT_AMBULATORY_CARE_PROVIDER_SITE_OTHER): Payer: Medicaid Other | Admitting: Family Medicine

## 2014-08-02 VITALS — BP 126/70 | HR 78 | Wt 202.0 lb

## 2014-08-02 DIAGNOSIS — K852 Alcohol induced acute pancreatitis without necrosis or infection: Secondary | ICD-10-CM

## 2014-08-02 DIAGNOSIS — R109 Unspecified abdominal pain: Secondary | ICD-10-CM

## 2014-08-02 DIAGNOSIS — F32A Depression, unspecified: Secondary | ICD-10-CM

## 2014-08-02 DIAGNOSIS — F329 Major depressive disorder, single episode, unspecified: Secondary | ICD-10-CM

## 2014-08-02 LAB — COMPREHENSIVE METABOLIC PANEL
ALT: 97 U/L — ABNORMAL HIGH (ref 0–53)
AST: 113 U/L — ABNORMAL HIGH (ref 0–37)
Albumin: 3.4 g/dL — ABNORMAL LOW (ref 3.5–5.2)
Alkaline Phosphatase: 201 U/L — ABNORMAL HIGH (ref 39–117)
BUN: 6 mg/dL (ref 6–23)
CO2: 25 mEq/L (ref 19–32)
Calcium: 8.7 mg/dL (ref 8.4–10.5)
Chloride: 102 mEq/L (ref 96–112)
Creat: 0.76 mg/dL (ref 0.50–1.35)
Glucose, Bld: 108 mg/dL — ABNORMAL HIGH (ref 70–99)
Potassium: 3.6 mEq/L (ref 3.5–5.3)
Sodium: 137 mEq/L (ref 135–145)
Total Bilirubin: 1.4 mg/dL — ABNORMAL HIGH (ref 0.2–1.2)
Total Protein: 7.4 g/dL (ref 6.0–8.3)

## 2014-08-02 LAB — CBC WITH DIFFERENTIAL/PLATELET
Basophils Absolute: 0 10*3/uL (ref 0.0–0.1)
Basophils Relative: 1 % (ref 0–1)
Eosinophils Absolute: 0 10*3/uL (ref 0.0–0.7)
Eosinophils Relative: 1 % (ref 0–5)
HCT: 37.4 % — ABNORMAL LOW (ref 39.0–52.0)
Hemoglobin: 12.6 g/dL — ABNORMAL LOW (ref 13.0–17.0)
Lymphocytes Relative: 18 % (ref 12–46)
Lymphs Abs: 0.7 10*3/uL (ref 0.7–4.0)
MCH: 32.1 pg (ref 26.0–34.0)
MCHC: 33.7 g/dL (ref 30.0–36.0)
MCV: 95.2 fL (ref 78.0–100.0)
Monocytes Absolute: 0.5 10*3/uL (ref 0.1–1.0)
Monocytes Relative: 12 % (ref 3–12)
Neutro Abs: 2.7 10*3/uL (ref 1.7–7.7)
Neutrophils Relative %: 68 % (ref 43–77)
Platelets: 285 10*3/uL (ref 150–400)
RBC: 3.93 MIL/uL — ABNORMAL LOW (ref 4.22–5.81)
RDW: 20 % — ABNORMAL HIGH (ref 11.5–15.5)
WBC: 4 10*3/uL (ref 4.0–10.5)

## 2014-08-02 LAB — AMYLASE: Amylase: 15 U/L (ref 0–105)

## 2014-08-02 LAB — LIPASE: Lipase: 40 U/L (ref 0–75)
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
## 2014-08-03 ENCOUNTER — Other Ambulatory Visit: Payer: Self-pay

## 2014-08-03 MED ORDER — EMETROL 1.87-1.87-21.5 PO SOLN: 10.0000 mL | ORAL | Status: DC | PRN

## 2014-08-05 ENCOUNTER — Other Ambulatory Visit: Payer: Self-pay | Admitting: Family Medicine

## 2014-08-15 ENCOUNTER — Other Ambulatory Visit: Payer: Self-pay

## 2014-08-15 ENCOUNTER — Ambulatory Visit (INDEPENDENT_AMBULATORY_CARE_PROVIDER_SITE_OTHER): Payer: Medicaid Other | Admitting: Family Medicine

## 2014-08-15 DIAGNOSIS — G90522 Complex regional pain syndrome I of left lower limb: Secondary | ICD-10-CM

## 2014-08-15 DIAGNOSIS — J069 Acute upper respiratory infection, unspecified: Secondary | ICD-10-CM

## 2014-08-15 DIAGNOSIS — D6851 Activated protein C resistance: Secondary | ICD-10-CM

## 2014-08-15 DIAGNOSIS — Z86711 Personal history of pulmonary embolism: Secondary | ICD-10-CM

## 2014-08-15 DIAGNOSIS — F1021 Alcohol dependence, in remission: Secondary | ICD-10-CM

## 2014-08-15 DIAGNOSIS — R748 Abnormal levels of other serum enzymes: Secondary | ICD-10-CM

## 2014-08-15 LAB — COMPREHENSIVE METABOLIC PANEL
ALT: 106 U/L — ABNORMAL HIGH (ref 0–53)
AST: 116 U/L — ABNORMAL HIGH (ref 0–37)
Albumin: 4.5 g/dL (ref 3.5–5.2)
Alkaline Phosphatase: 105 U/L (ref 39–117)
BUN: 8 mg/dL (ref 6–23)
CO2: 26 mEq/L (ref 19–32)
Calcium: 10 mg/dL (ref 8.4–10.5)
Chloride: 102 mEq/L (ref 96–112)
Creat: 0.84 mg/dL (ref 0.50–1.35)
Glucose, Bld: 125 mg/dL — ABNORMAL HIGH (ref 70–99)
Potassium: 4.2 mEq/L (ref 3.5–5.3)
Sodium: 138 mEq/L (ref 135–145)
Total Bilirubin: 0.7 mg/dL (ref 0.2–1.2)
Total Protein: 7.7 g/dL (ref 6.0–8.3)

## 2014-08-15 LAB — CBC WITH DIFFERENTIAL/PLATELET
Basophils Absolute: 0 10*3/uL (ref 0.0–0.1)
Basophils Relative: 1 % (ref 0–1)
Eosinophils Absolute: 0.3 10*3/uL (ref 0.0–0.7)
Eosinophils Relative: 6 % — ABNORMAL HIGH (ref 0–5)
HCT: 45 % (ref 39.0–52.0)
Hemoglobin: 15 g/dL (ref 13.0–17.0)
Lymphocytes Relative: 21 % (ref 12–46)
Lymphs Abs: 0.9 10*3/uL (ref 0.7–4.0)
MCH: 31 pg (ref 26.0–34.0)
MCHC: 33.3 g/dL (ref 30.0–36.0)
MCV: 93 fL (ref 78.0–100.0)
Monocytes Absolute: 0.6 10*3/uL (ref 0.1–1.0)
Monocytes Relative: 15 % — ABNORMAL HIGH (ref 3–12)
Neutro Abs: 2.5 10*3/uL (ref 1.7–7.7)
Neutrophils Relative %: 57 % (ref 43–77)
Platelets: 258 10*3/uL (ref 150–400)
RBC: 4.84 MIL/uL (ref 4.22–5.81)
RDW: 17.9 % — ABNORMAL HIGH (ref 11.5–15.5)
WBC: 4.3 10*3/uL (ref 4.0–10.5)

## 2014-08-15 MED ORDER — BENZONATATE 100 MG PO CAPS: 100.0000 mg | ORAL_CAPSULE | Freq: Two times a day (BID) | ORAL | Status: DC | PRN

## 2014-08-15 MED ORDER — POLYSACCHARIDE IRON COMPLEX 150 MG PO CAPS: 150.0000 mg | ORAL_CAPSULE | Freq: Every day | ORAL | Status: DC

## 2014-08-15 MED ORDER — GABAPENTIN 400 MG PO CAPS: 400.0000 mg | ORAL_CAPSULE | Freq: Three times a day (TID) | ORAL | Status: DC

## 2014-08-15 MED ORDER — TRAMADOL HCL 50 MG PO TABS: 100.0000 mg | ORAL_TABLET | Freq: Four times a day (QID) | ORAL | Status: DC | PRN

## 2014-08-15 MED ORDER — FERROUS SULFATE 325 (65 FE) MG PO TABS: 325.0000 mg | ORAL_TABLET | Freq: Every day | ORAL | Status: DC

## 2014-08-15 NOTE — Progress Notes (Signed)
Subjective:    Patient ID: Frederick Payne, male    DOB: 11/10/1960, 53 y.o.   MRN: [id number]  HPI He is here for a recheck. He C&C doing well. He has not had any more alcohol consumption. He has been trying to lose weight to get back to his pre-sickness weight level. He plans to start yoga classes. He still does have difficulty with RSD. He uses gabapentin and tramadol. The gabapentin regimen should be 400 mg 3 times a day however he was not getting that dosing regimen. Also has factor V Leyden and has had difficulty maintaining his hemoglobin level. He was placed on iron supplementation as well as copper supplementation by his hematologist in [place]. Presently he does have URI symptoms with nasal congestion, runny nose, malaise and coughing.   Review of Systems     Objective:   Physical Exam  Alert and in no distress with evidence of nasal congestion      Assessment & Plan:  URI, acute - Plan: benzonatate (TESSALON) 100 MG capsule  RSD lower limb, left - Plan: gabapentin (NEURONTIN) 400 MG capsule, traMADol (ULTRAM) 50 MG tablet  History of pulmonary embolus (PE)  Factor V Leiden - Plan: ferrous sulfate 325 (65 FE) MG tablet, iron polysaccharides (POLY-IRON 150) 150 MG capsule  Alcohol dependence in remission  Abnormal liver enzymes - Plan: CBC with Differential, Comprehensive metabolic panel  I will give him Tessalon Perles and avoid NyQuil since it does have alcohol. We adjust his gabapentin to 400 3 times a day which apparently does control his RSD pain. He will also be given tramadol. He is to return later for a flu shot.

## 2014-08-15 NOTE — Telephone Encounter (Signed)
CALLED IN TRAMADOL

## 2014-08-16 NOTE — Addendum Note (Signed)
Addended by: Denita Lung on: 08/16/2014 10:20 AM   Modules accepted: Orders

## 2014-08-23 ENCOUNTER — Other Ambulatory Visit: Payer: Self-pay | Admitting: Family Medicine

## 2014-08-23 NOTE — Telephone Encounter (Signed)
Is this okay?

## 2014-08-23 NOTE — Telephone Encounter (Signed)
Okay to refill? 

## 2014-08-29 ENCOUNTER — Ambulatory Visit (INDEPENDENT_AMBULATORY_CARE_PROVIDER_SITE_OTHER): Payer: Medicaid Other | Admitting: Family Medicine

## 2014-08-29 ENCOUNTER — Encounter: Payer: Self-pay | Admitting: Family Medicine

## 2014-08-29 VITALS — BP 100/70 | HR 57 | Wt 196.0 lb

## 2014-08-29 DIAGNOSIS — Z23 Encounter for immunization: Secondary | ICD-10-CM

## 2014-08-29 DIAGNOSIS — R195 Other fecal abnormalities: Secondary | ICD-10-CM

## 2014-08-29 DIAGNOSIS — Z7901 Long term (current) use of anticoagulants: Secondary | ICD-10-CM

## 2014-08-29 DIAGNOSIS — Z86711 Personal history of pulmonary embolism: Secondary | ICD-10-CM

## 2014-08-29 DIAGNOSIS — Z952 Presence of prosthetic heart valve: Secondary | ICD-10-CM

## 2014-08-29 DIAGNOSIS — Z954 Presence of other heart-valve replacement: Secondary | ICD-10-CM

## 2014-08-29 DIAGNOSIS — D6851 Activated protein C resistance: Secondary | ICD-10-CM

## 2014-08-29 LAB — CBC WITH DIFFERENTIAL/PLATELET
Basophils Absolute: 0 10*3/uL (ref 0.0–0.1)
Eosinophils Absolute: 0 10*3/uL (ref 0.0–0.7)
HCT: 42.1 % (ref 39.0–52.0)
Hemoglobin: 13.5 g/dL (ref 13.0–17.0)
Lymphocytes Relative: 20 % (ref 12–46)
Lymphs Abs: 1.1 10*3/uL (ref 0.7–4.0)
MCH: 30.5 pg (ref 26.0–34.0)
MCHC: 32.1 g/dL (ref 30.0–36.0)
MCV: 95.2 fL (ref 78.0–100.0)
Monocytes Absolute: 0.4 10*3/uL (ref 0.1–1.0)
Monocytes Relative: 7 % (ref 3–12)
Neutro Abs: 4.1 10*3/uL (ref 1.7–7.7)
Neutrophils Relative %: 73 % (ref 43–77)
Platelets: 181 10*3/uL (ref 150–400)
RBC: 4.42 MIL/uL (ref 4.22–5.81)
RDW: 16.6 % — ABNORMAL HIGH (ref 11.5–15.5)
WBC: 5.6 10*3/uL (ref 4.0–10.5)

## 2014-08-29 LAB — PROTIME-INR
INR: 4.77 — ABNORMAL HIGH (ref <1.50)
Prothrombin Time: 45.1 seconds — ABNORMAL HIGH (ref 11.6–15.2)

## 2014-08-29 NOTE — Progress Notes (Signed)
Subjective:    Patient ID: Frederick Payne, male    DOB: 1961/03/12, 53 y.o.   MRN: [id number]  HPI He is here for a recheck. He has remained alcohol free. He was seen by gastroenterology on October 30 for follow-up and blood work. The blood work was reviewed and did show an improvement in his liver enzymes. PT/INR was also done which was elevated at 5.9. He actually states his eating habits are better now that he has stopped drinking and he is taking a multivitamin with vitamin K every other day. He has noted some black stool but no bleeding from the gums, nose, hematuria or easy bruising. He does have a history of factor V Leiden as well as PE and AVR.he recently did have a flu shot.   Review of Systems     Objective:   Physical Exam Alert and in no distress. Skin shows no ecchymosis or petechiae. Throat is clear. Neck is supple without adenopathy. Rectal exam is guaiac positive. Postural signs negative. PT/INRis 4.7       Assessment & Plan:  Guaiac positive stools - Plan: Protime-INR, CBC with Differential, CANCELED: CBC with Differential, CANCELED: Protime-INR  Need for prophylactic vaccination against Streptococcus pneumoniae (pneumococcus) - Plan: Pneumococcal conjugate vaccine 13-valent  Factor V Leiden - Plan: Protime-INR, CBC with Differential  History of pulmonary embolus (PE)  S/P AVR - Plan: Protime-INR, CBC with Differential, CANCELED: CBC with Differential, CANCELED: Protime-INR  Long-term (current) use of anticoagulants - Plan: Protime-INR, CBC with Differential, CANCELED: CBC with Differential, CANCELED: Protime-INR he is to hold his Coumadin today and restart it the next day. Take a multivitamin every day and continue his present eating regimen. Cautioned him to call me if he has any episode of bleeding . Recheck here in 3 days.

## 2014-09-05 ENCOUNTER — Encounter: Payer: Self-pay | Admitting: Family Medicine

## 2014-09-07 ENCOUNTER — Encounter: Payer: Self-pay | Admitting: *Deleted

## 2014-09-21 NOTE — Telephone Encounter (Signed)
This encounter was created in error - please disregard.

## 2014-11-17 ENCOUNTER — Ambulatory Visit (INDEPENDENT_AMBULATORY_CARE_PROVIDER_SITE_OTHER): Payer: Medicaid Other | Admitting: Family Medicine

## 2014-11-17 ENCOUNTER — Encounter: Payer: Self-pay | Admitting: Family Medicine

## 2014-11-17 VITALS — BP 112/74 | HR 72 | Temp 98.0°F | Ht 68.0 in | Wt 206.0 lb

## 2014-11-17 DIAGNOSIS — R7989 Other specified abnormal findings of blood chemistry: Secondary | ICD-10-CM

## 2014-11-17 DIAGNOSIS — Z7901 Long term (current) use of anticoagulants: Secondary | ICD-10-CM

## 2014-11-17 DIAGNOSIS — D6851 Activated protein C resistance: Secondary | ICD-10-CM

## 2014-11-17 DIAGNOSIS — M7989 Other specified soft tissue disorders: Secondary | ICD-10-CM

## 2014-11-17 DIAGNOSIS — M79605 Pain in left leg: Secondary | ICD-10-CM

## 2014-11-17 DIAGNOSIS — M79602 Pain in left arm: Secondary | ICD-10-CM

## 2014-11-17 DIAGNOSIS — R945 Abnormal results of liver function studies: Secondary | ICD-10-CM

## 2014-11-17 LAB — COMPREHENSIVE METABOLIC PANEL
ALT: 59 U/L — ABNORMAL HIGH (ref 0–53)
AST: 58 U/L — ABNORMAL HIGH (ref 0–37)
Albumin: 4 g/dL (ref 3.5–5.2)
Alkaline Phosphatase: 67 U/L (ref 39–117)
BUN: 7 mg/dL (ref 6–23)
CO2: 21 mEq/L (ref 19–32)
Calcium: 8.8 mg/dL (ref 8.4–10.5)
Chloride: 106 mEq/L (ref 96–112)
Creat: 0.94 mg/dL (ref 0.50–1.35)
Glucose, Bld: 98 mg/dL (ref 70–99)
Potassium: 4 mEq/L (ref 3.5–5.3)
Sodium: 140 mEq/L (ref 135–145)
Total Bilirubin: 0.4 mg/dL (ref 0.2–1.2)
Total Protein: 7.2 g/dL (ref 6.0–8.3)

## 2014-11-17 LAB — D-DIMER, QUANTITATIVE: D-Dimer, Quant: 0.43 ug/mL-FEU (ref 0.00–0.48)

## 2014-11-17 LAB — PROTIME-INR
INR: 1.4 (ref <1.50)
Prothrombin Time: 17.2 seconds — ABNORMAL HIGH (ref 11.6–15.2)

## 2014-11-17 NOTE — Progress Notes (Signed)
Chief Complaint  Patient presents with  . Leg Pain    had blood clot in left leg. Has been without his cane for 6 months now but has had to use for the last two days. Extremely painful from knee down to foot, also slight discoloration. Feel like "it is throbbing and vibrating."    He presents with 2 days of pain in the foot with walking.  Yesterday afternoon he noticed that pain was from the knee down, no longer just in the foot, described as a throbbing pain.  The foot feels like it is buzzing, like being electrocuted, or like something is stabbing through the foot.  Denies injury, trauma, change in activity, change in shoes.  He has some swelling--started in the foot, and spreading into the calf. He has been trying to keep the foot elevated.  He has h/o DVT, Factor V Leiden and RSD of the foot.   RSD pain has been flaring some.  His partner started him on some sort of "[place]" [place]--supposedly containing mainly B vitamins.  Since using this medication (over 6 months), RSD pain got significantly better--got off narcotics, pain meds, and didn't need to use the cane.  Pain was tolerable. Last INR was 4.77 in early November.  He periodically has more swelling and more pain in the right foot/calf.  Often swells and has medial swelling at the ankle when he is on his feet for a couple of hours.  Hematologist at [facility], every 3 months, gets iron transfusion (and blood). Admits to being late in getting PT/INR done--he apparently gets done here, not through hematologist.  Had 1 drink today to deal with the pain.  Otherwise hasn't had a drink in a few months.  PMH, PSH, SH reviewed.  Outpatient Encounter Prescriptions as of 11/17/2014  Medication Sig Note  . ascorbic acid (VITAMIN C) 500 MG tablet Take 500 mg by mouth daily.   [name] aspirin 81 MG tablet Take 81 mg by mouth daily.   [name] CALCIUM PO Take by mouth.   . Copper Gluconate (COPPER CAPS) 2 MG CAPS Take by mouth every evening.   .  ferrous sulfate 325 (65 FE) MG tablet Take 1 tablet (325 mg total) by mouth daily.   . fish oil-omega-3 fatty acids 1000 MG capsule Take 1 g by mouth daily.   . iron polysaccharides (POLY-IRON 150) 150 MG capsule Take 1 capsule (150 mg total) by mouth daily.   . Multiple Vitamins-Minerals (ONE-A-DAY MENS 50+ ADVANTAGE PO) Take by mouth daily.   [name] POLICOSANOL PO Take 20 mg by mouth daily.   . TOPROL XL 100 MG 24 hr tablet TAKE AS DIRECTED   . warfarin (COUMADIN) 5 MG tablet Take 5 mg by mouth daily. Pt takes 10mg  mon,wen,fri,sat,sun he takes 12mg  tues,thurs   . XOPENEX HFA 45 MCG/ACT inhaler INHALE 1-2 PUFFS EVERY FOUR TO SIX HOURS FOR FOR WHEEZING OR SHORTNESS OF BREATH 11/17/2014: Uses prn--needed a dose this morning  . [DISCONTINUED] gabapentin (NEURONTIN) 400 MG capsule Take 1 capsule (400 mg total) by mouth 3 (three) times daily. (Patient not taking: Reported on 11/17/2014)    Allergies  Allergen Reactions  . Benzodiazepines Other (See Comments)    Causes pt to get violent   . Contrast Media [Iodinated Diagnostic Agents]   . Pectin   . Shellfish Allergy   . Sulfa Antibiotics   . Tylenol [Acetaminophen]    ROS:  No fevers, chills, URI symptoms, chest pain, shortness of breath, nausea,  vomiting ,diarrhea.  No bleeding, bruising, rash. No joint pains (just foot as per HPI), urinary symptoms or other concerns.  PHYSICAL EXAM: BP 112/74 mmHg  Pulse 72  Temp(Src) 98 F (36.7 C) (Tympanic)  Ht 5\' 8"  (1.727 m)  Wt 206 lb (93.441 kg)  BMI 31.33 kg/m2 Pleasant, cooperative male in no distress Neck: no lymphadenopathy, thyromegaly or mass Audible mechanical click of heart valve, regular rhythm noted.  Calf circumference on left is 17 inches, 15-3/4 inches on the right.  There is no warmth, erythema.  No pitting edema. No palpable cords. Skin is very dry.  He is exquisitely sensitive to the touch over the top of his left foot and at his ankle.   Pain with Homan is anterior at the ankle,  no pain posteriorly.  ASSESSMENT/PLAN:  Acute leg pain, left - in pt with factor V Leiden and h/o DVT.  - Plan: Protime-INR, D-dimer, quantitative  Leg swelling  Factor V Leiden  Anticoagulation monitoring, INR range 2.5-3.5 - Plan: Protime-INR  Elevated LFTs - Plan: Comprehensive metabolic panel   Leg swelling/asymmetry with acute worsening of left foot pain and swelling.  He is past due in getting INR's checked--takes coumadin for valve replacement, as well as for factor V Leiden, h/o DVT. PT/INR stat D-dimer  He has a [place] Filter, which should help protect against PE if he has a DVT. He declined going to ER (recommended by his hematologist today). May need to go if having increasing pain, pleuritic chest pain, SOB. Keep left leg elevated.

## 2014-11-17 NOTE — Patient Instructions (Signed)
We are ordering stat labs. We will get these this evening and be in touch with what the next step is. Keep the leg elevated

## 2014-11-23 DIAGNOSIS — I82512 Chronic embolism and thrombosis of left femoral vein: Secondary | ICD-10-CM | POA: Diagnosis not present

## 2014-11-23 DIAGNOSIS — I82532 Chronic embolism and thrombosis of left popliteal vein: Secondary | ICD-10-CM | POA: Diagnosis not present

## 2014-11-24 ENCOUNTER — Other Ambulatory Visit: Payer: Self-pay | Admitting: Cardiology

## 2014-11-24 NOTE — Telephone Encounter (Signed)
Rx has been sent to the pharmacy electronically. ° °

## 2014-12-08 ENCOUNTER — Telehealth: Payer: Self-pay | Admitting: Family Medicine

## 2014-12-08 NOTE — Telephone Encounter (Signed)
Ok

## 2014-12-08 NOTE — Telephone Encounter (Signed)
Pt states he is to come in for a PT INR lab only appt. Would like to come in for this on 12/12/14. Is this ok?

## 2014-12-12 ENCOUNTER — Other Ambulatory Visit: Payer: Medicaid Other

## 2014-12-12 DIAGNOSIS — D649 Anemia, unspecified: Secondary | ICD-10-CM

## 2014-12-12 DIAGNOSIS — Z7901 Long term (current) use of anticoagulants: Secondary | ICD-10-CM

## 2014-12-12 LAB — PROTIME-INR
INR: 1.56 — ABNORMAL HIGH (ref <1.50)
Prothrombin Time: 18.8 seconds — ABNORMAL HIGH (ref 11.6–15.2)

## 2014-12-12 NOTE — Telephone Encounter (Signed)
Pt informed

## 2014-12-13 ENCOUNTER — Encounter: Payer: Self-pay | Admitting: Family Medicine

## 2014-12-13 ENCOUNTER — Ambulatory Visit (INDEPENDENT_AMBULATORY_CARE_PROVIDER_SITE_OTHER): Payer: Medicaid Other | Admitting: Family Medicine

## 2014-12-13 VITALS — BP 116/78 | HR 50 | Wt 207.0 lb

## 2014-12-13 DIAGNOSIS — J4530 Mild persistent asthma, uncomplicated: Secondary | ICD-10-CM

## 2014-12-13 DIAGNOSIS — Z952 Presence of prosthetic heart valve: Secondary | ICD-10-CM

## 2014-12-13 DIAGNOSIS — Z86711 Personal history of pulmonary embolism: Secondary | ICD-10-CM

## 2014-12-13 DIAGNOSIS — L989 Disorder of the skin and subcutaneous tissue, unspecified: Secondary | ICD-10-CM

## 2014-12-13 DIAGNOSIS — D6851 Activated protein C resistance: Secondary | ICD-10-CM

## 2014-12-13 DIAGNOSIS — Z954 Presence of other heart-valve replacement: Secondary | ICD-10-CM

## 2014-12-13 LAB — CBC WITH DIFFERENTIAL/PLATELET
Basophils Absolute: 0 10*3/uL (ref 0.0–0.1)
Basophils Relative: 1 % (ref 0–1)
Eosinophils Absolute: 0.1 10*3/uL (ref 0.0–0.7)
Eosinophils Relative: 2 % (ref 0–5)
HCT: 45.9 % (ref 39.0–52.0)
Hemoglobin: 15.4 g/dL (ref 13.0–17.0)
Lymphocytes Relative: 34 % (ref 12–46)
Lymphs Abs: 1.1 10*3/uL (ref 0.7–4.0)
MCH: 30.7 pg (ref 26.0–34.0)
MCHC: 33.6 g/dL (ref 30.0–36.0)
MCV: 91.6 fL (ref 78.0–100.0)
MPV: 10.6 fL (ref 8.6–12.4)
Monocytes Absolute: 0.3 10*3/uL (ref 0.1–1.0)
Monocytes Relative: 9 % (ref 3–12)
Neutro Abs: 1.7 10*3/uL (ref 1.7–7.7)
Neutrophils Relative %: 54 % (ref 43–77)
Platelets: 187 10*3/uL (ref 150–400)
RBC: 5.01 MIL/uL (ref 4.22–5.81)
RDW: 16.8 % — ABNORMAL HIGH (ref 11.5–15.5)
WBC: 3.2 10*3/uL — ABNORMAL LOW (ref 4.0–10.5)

## 2014-12-13 MED ORDER — WARFARIN SODIUM 10 MG PO TABS: 10.0000 mg | ORAL_TABLET | Freq: Every day | ORAL | Status: DC

## 2014-12-13 MED ORDER — MOMETASONE FUROATE 220 MCG/INH IN AEPB: 2.0000 | INHALATION_SPRAY | Freq: Every day | RESPIRATORY_TRACT | Status: DC

## 2014-12-13 MED ORDER — LEVALBUTEROL TARTRATE 45 MCG/ACT IN AERO: INHALATION_SPRAY | RESPIRATORY_TRACT | Status: DC

## 2014-12-13 NOTE — Progress Notes (Signed)
Subjective:    Patient ID: Frederick Payne, male    DOB: 1961-06-12, 54 y.o.   MRN: [id number]  HPI He is here for consult concerning his low PT/INR. Presently he is taking 10 over daily and supplementing this with 2 mg on Tuesdays and Thursdays. Over the last several months he has stopped several of his medications. He also has refrained from all alcohol and is doing quite well with this. He is taking a multivitamin does have vitamin K in it. He is on 2 iron supplements. He also has 2 lesions that he would like me to evaluate. One apparently did have a biopsy done in the past however he is noting some symptoms surrounding this. He was told that it was a benign lesion. He would like a refill on his Xopenex. He states that he is using this regularly on a daily basis. He does have an underlying history of allergies and asthma. Review his record indicates he had atrial fibrillation. He states that he had this transiently in the hospital but no problems since then.  Review of Systems     Objective:   Physical Exam Alert and in no distress. Right forearm does show a slightly raised brownish purple lesion of approximately 1 x 2 cm in size. The left flank does have an oval-shaped dry and slightly reddish appearing lesion. Artifact exam shows a regular rhythm.      Assessment & Plan:  Asthma with allergic rhinitis, mild persistent, uncomplicated - Plan: mometasone (ASMANEX 60 METERED DOSES) 220 MCG/INH inhaler, levalbuterol (XOPENEX HFA) 45 MCG/ACT inhaler  Factor V Leiden - Plan: warfarin (COUMADIN) 10 MG tablet, Protime-INR  History of pulmonary embolus (PE) - Plan: warfarin (COUMADIN) 10 MG tablet, Protime-INR  S/P AVR - Plan: warfarin (COUMADIN) 10 MG tablet, Protime-INR  Skin lesions  I will have him increase his Coumadin to 12 mg daily and recheck this in one week. He is to get the pathology report from the lesion on his right forearm and send me a copy. Recommend returning here for about  see of the left flank lesion although this does appear to possibly be psoriatic in nature. I stressed the need for him to maintain a normal amount of medication, multivitamin, greens in diet. Also encouraged him to continue to remain alcohol free.

## 2014-12-16 ENCOUNTER — Encounter: Payer: Self-pay | Admitting: Family Medicine

## 2014-12-16 ENCOUNTER — Ambulatory Visit (INDEPENDENT_AMBULATORY_CARE_PROVIDER_SITE_OTHER): Payer: Medicare Other | Admitting: Family Medicine

## 2014-12-16 VITALS — BP 122/78 | HR 48 | Wt 201.0 lb

## 2014-12-16 DIAGNOSIS — L989 Disorder of the skin and subcutaneous tissue, unspecified: Secondary | ICD-10-CM

## 2014-12-16 DIAGNOSIS — Z7901 Long term (current) use of anticoagulants: Secondary | ICD-10-CM

## 2014-12-16 MED ORDER — WARFARIN SODIUM 2 MG PO TABS: 2.0000 mg | ORAL_TABLET | Freq: Every day | ORAL | Status: DC

## 2014-12-16 NOTE — Progress Notes (Signed)
Subjective:    Patient ID: Frederick Payne, male    DOB: 05/19/1961, 54 y.o.   MRN: [id number]  HPI He is here for a punch biopsy of the lesion on the left flank. He also needs a refill on his Coumadin 2 mg.   Review of Systems     Objective:   Physical Exam The lesion on the left flank was injected with Xylocaine and epinephrine. A 2 mm punch biopsy was taken. Adequate hemostasis was obtained. Also recent Path report from a previous lesion shows a dermatofibroma.       Assessment & Plan:  Long-term (current) use of anticoagulants  Skin lesion  Coumadin 2 mg was called in.

## 2014-12-19 ENCOUNTER — Other Ambulatory Visit: Payer: Self-pay | Admitting: Family Medicine

## 2014-12-19 ENCOUNTER — Other Ambulatory Visit: Payer: Self-pay

## 2014-12-19 ENCOUNTER — Other Ambulatory Visit: Payer: Medicaid Other

## 2014-12-19 DIAGNOSIS — Z952 Presence of prosthetic heart valve: Secondary | ICD-10-CM

## 2014-12-19 DIAGNOSIS — Z7901 Long term (current) use of anticoagulants: Secondary | ICD-10-CM

## 2014-12-19 DIAGNOSIS — Z86711 Personal history of pulmonary embolism: Secondary | ICD-10-CM

## 2014-12-19 DIAGNOSIS — D6851 Activated protein C resistance: Secondary | ICD-10-CM

## 2014-12-19 LAB — PROTIME-INR
INR: 1.77 — ABNORMAL HIGH (ref <1.50)
Prothrombin Time: 20.8 seconds — ABNORMAL HIGH (ref 11.6–15.2)

## 2014-12-22 ENCOUNTER — Encounter: Payer: Self-pay | Admitting: Family Medicine

## 2014-12-23 ENCOUNTER — Ambulatory Visit: Payer: Medicaid Other

## 2014-12-23 ENCOUNTER — Other Ambulatory Visit: Payer: Self-pay | Admitting: Cardiology

## 2014-12-23 ENCOUNTER — Telehealth: Payer: Self-pay | Admitting: Internal Medicine

## 2014-12-23 ENCOUNTER — Other Ambulatory Visit: Payer: Self-pay

## 2014-12-23 DIAGNOSIS — D6859 Other primary thrombophilia: Secondary | ICD-10-CM | POA: Diagnosis not present

## 2014-12-23 DIAGNOSIS — Z952 Presence of prosthetic heart valve: Secondary | ICD-10-CM

## 2014-12-23 DIAGNOSIS — Z86711 Personal history of pulmonary embolism: Secondary | ICD-10-CM

## 2014-12-23 DIAGNOSIS — D6851 Activated protein C resistance: Secondary | ICD-10-CM | POA: Diagnosis not present

## 2014-12-23 DIAGNOSIS — Z954 Presence of other heart-valve replacement: Secondary | ICD-10-CM | POA: Diagnosis not present

## 2014-12-23 LAB — PROTIME-INR
INR: 4.02 — ABNORMAL HIGH (ref <1.50)
Prothrombin Time: 39.4 seconds — ABNORMAL HIGH (ref 11.6–15.2)

## 2014-12-23 NOTE — Telephone Encounter (Signed)
Stat report on pt with PT- 39.4 and INR-4.02.

## 2014-12-23 NOTE — Telephone Encounter (Signed)
Patient is aware of PT/INR results and Dorothea Ogle PA recommendations on the change of his dose

## 2014-12-23 NOTE — Telephone Encounter (Signed)
He just had an INR done last week and it was under 2.  If I am reading through the notes correctly he was on Coumadin 12 mg daily last week and increased to 14 mg daily.  Is this correct?   If so lets go to 13 mg daily.  This he have the proper tablets to make 13 mg daily such as 10 mg tablets and 3 mg tablets or 1 mg tablets?

## 2014-12-26 ENCOUNTER — Encounter: Payer: Self-pay | Admitting: Cardiology

## 2014-12-26 ENCOUNTER — Other Ambulatory Visit: Payer: Self-pay

## 2014-12-26 ENCOUNTER — Ambulatory Visit (INDEPENDENT_AMBULATORY_CARE_PROVIDER_SITE_OTHER): Payer: Medicare Other | Admitting: Cardiology

## 2014-12-26 VITALS — BP 120/82 | HR 84 | Ht 68.0 in | Wt 206.8 lb

## 2014-12-26 DIAGNOSIS — I259 Chronic ischemic heart disease, unspecified: Secondary | ICD-10-CM | POA: Diagnosis not present

## 2014-12-26 DIAGNOSIS — Z952 Presence of prosthetic heart valve: Secondary | ICD-10-CM

## 2014-12-26 DIAGNOSIS — Z954 Presence of other heart-valve replacement: Secondary | ICD-10-CM

## 2014-12-26 DIAGNOSIS — D509 Iron deficiency anemia, unspecified: Secondary | ICD-10-CM

## 2014-12-26 LAB — BASIC METABOLIC PANEL
BUN: 13 mg/dL (ref 6–23)
CO2: 24 mEq/L (ref 19–32)
Calcium: 10.2 mg/dL (ref 8.4–10.5)
Chloride: 104 mEq/L (ref 96–112)
Creatinine, Ser: 1 mg/dL (ref 0.40–1.50)
GFR: 82.91 mL/min (ref >60.00)
Glucose, Bld: 97 mg/dL (ref 70–99)
Potassium: 4.1 mEq/L (ref 3.5–5.1)
Sodium: 138 mEq/L (ref 135–145)

## 2014-12-26 LAB — CBC WITH DIFFERENTIAL/PLATELET
Basophils Absolute: 0 10*3/uL (ref 0.0–0.1)
Basophils Relative: 0.4 % (ref 0.0–3.0)
Eosinophils Absolute: 0 10*3/uL (ref 0.0–0.7)
Eosinophils Relative: 0.7 % (ref 0.0–5.0)
HCT: 48.8 % (ref 39.0–52.0)
Hemoglobin: 16.4 g/dL (ref 13.0–17.0)
Lymphocytes Relative: 21.6 % (ref 12.0–46.0)
Lymphs Abs: 1.2 10*3/uL (ref 0.7–4.0)
MCHC: 33.7 g/dL (ref 30.0–36.0)
MCV: 88.6 fl (ref 78.0–100.0)
Monocytes Absolute: 0.4 10*3/uL (ref 0.1–1.0)
Monocytes Relative: 6.8 % (ref 3.0–12.0)
Neutro Abs: 4 10*3/uL (ref 1.4–7.7)
Neutrophils Relative %: 70.5 % (ref 43.0–77.0)
Platelets: 193 10*3/uL (ref 150.0–400.0)
RBC: 5.51 Mil/uL (ref 4.22–5.81)
RDW: 16.2 % — ABNORMAL HIGH (ref 11.5–15.5)
WBC: 5.7 10*3/uL (ref 4.0–10.5)

## 2014-12-26 MED ORDER — METOPROLOL SUCCINATE ER 100 MG PO TB24: 100.0000 mg | ORAL_TABLET | Freq: Every day | ORAL | Status: DC

## 2014-12-26 NOTE — Patient Instructions (Signed)
Will obtain labs today and call you with the results (bmet/cbc)  Your physician recommends that you continue on your current medications as directed. Please refer to the Current Medication list given to you today.  Your physician has requested that you have an echocardiogram. Echocardiography is a painless test that uses sound waves to create images of your heart. It provides your doctor with information about the size and shape of your heart and how well your heart's chambers and valves are working. This procedure takes approximately one hour. There are no restrictions for this procedure.  Your physician wants you to follow-up in: 6 month ov  You will receive a reminder letter in the mail two months in advance. If you don't receive a letter, please call our office to schedule the follow-up appointment.

## 2014-12-26 NOTE — Progress Notes (Signed)
Frederick Payne Date of Birth:  05-30-1961 [facility] [address] [place], [place]  [address] [phone]        Fax   [phone]   History of Present Illness: This pleasant 55 year old gentleman is seen by me for the first time today.  He had seen Dr. [place] in 2014.  He is a medical patient of Dr. Susann Givens.  The patient has an interesting cardiac history.  He has a history of Leiden 5 deficiency and has had problems with hypercoagulability in the past.  He is on long-term Coumadin managed by Dr. Susann Givens.  He has had a previous blood clot to his left leg which caused him to have an ischemic neuropathy and he has leg weakness and walks with a limp.  He states that he has had 3 separate episodes of pulmonary emboli.  He has a inferior vena cava filter in place now.  Asian has a history of ischemic heart disease and valvular heart disease.  In July 2014 he underwent aortic valve replacement with a St. Jude mechanical valve.  He also had single-vessel coronary artery bypass graft surgery. Patient has been experiencing some shortness of breath with activity.  He ran out of his Toprol over the weekend and his pulse is rapid today and he also feels nervous and weak. His family history is not known to the patient.  He thinks that his parents are still alive but has not spoken to them in many decades.  Current Outpatient Prescriptions  Medication Sig Dispense Refill  . ascorbic acid (VITAMIN C) 500 MG tablet Take 500 mg by mouth daily.    [name] aspirin 81 MG tablet Take 81 mg by mouth daily.    [name] CALCIUM PO Take 1 tablet by mouth daily.     . Copper Gluconate (COPPER CAPS) 2 MG CAPS Take by mouth every evening.    . ferrous sulfate 325 (65 FE) MG tablet Take 1 tablet (325 mg total) by mouth daily. 90 tablet 3  . fish oil-omega-3 fatty acids 1000 MG capsule Take 1 g by mouth daily.    . iron polysaccharides (POLY-IRON 150) 150 MG capsule Take 1 capsule (150 mg total) by mouth  daily. 90 capsule 3  . levalbuterol (XOPENEX HFA) 45 MCG/ACT inhaler INHALE 1-2 PUFFS EVERY FOUR TO SIX HOURS FOR FOR WHEEZING OR SHORTNESS OF BREATH 15 g 1  . metoprolol succinate (TOPROL-XL) 100 MG 24 hr tablet Take 1 tablet (100 mg total) by mouth at bedtime. Take with or immediately following a meal. 30 tablet 11  . mometasone (ASMANEX 60 METERED DOSES) 220 MCG/INH inhaler Inhale 2 puffs into the lungs daily. 1 Inhaler 12  . Multiple Vitamins-Minerals (ONE-A-DAY MENS 50+ ADVANTAGE PO) Take by mouth daily.    [name] POLICOSANOL PO Take 20 mg by mouth daily.    [name] warfarin (COUMADIN) 10 MG tablet Take 1 tablet (10 mg total) by mouth daily. 30 tablet 5  . warfarin (COUMADIN) 2 MG tablet Take 1 tablet (2 mg total) by mouth daily. 30 tablet 5   No current facility-administered medications for this visit.    Allergies  Allergen Reactions  . Benzodiazepines Other (See Comments)    Causes pt to get violent   . Contrast Media [Iodinated Diagnostic Agents] Hives  . Pectin Anaphylaxis  . Shellfish Allergy Anaphylaxis  . Sulfa Antibiotics Anaphylaxis  . Tylenol [Acetaminophen] Nausea And Vomiting    headache    Patient Active Problem List  Diagnosis Date Noted  . Asthma with allergic rhinitis 02/08/2014  . Allergic rhinitis 02/08/2014  . Anemia 02/08/2014  . Factor V Leiden 02/08/2014  . RSD lower limb 02/08/2014  . History of pulmonary embolus (PE) 02/08/2014  . S/P AVR 08/17/2012  . CAD (coronary artery disease) of artery bypass graft 08/17/2012  . S/P CABG (coronary artery bypass graft) 08/17/2012    History  Smoking status  . Former Smoker  Smokeless tobacco  . Not on file    Comment: quit 15 + yrs ago    History  Alcohol Use No    Family History  Problem Relation Age of Onset  . Stroke Paternal Grandmother     Review of Systems: Constitutional: no fever chills diaphoresis or fatigue or change in weight.  Head and neck: no hearing loss, no epistaxis, no photophobia or  visual disturbance. Respiratory: No cough, shortness of breath or wheezing. Cardiovascular: No chest pain peripheral edema, palpitations. Gastrointestinal: No abdominal distention, no abdominal pain, no change in bowel habits hematochezia or melena. Genitourinary: No dysuria, no frequency, no urgency, no nocturia. Musculoskeletal:No arthralgias, no back pain, no gait disturbance or myalgias. Neurological: No dizziness, no headaches, no numbness, no seizures, no syncope, no weakness, no tremors. Hematologic: No lymphadenopathy, no easy bruising. Psychiatric: No confusion, no hallucinations, no sleep disturbance.   Wt Readings from Last 3 Encounters:  12/26/14 206 lb 12.8 oz (93.804 kg)  12/16/14 201 lb (91.173 kg)  12/13/14 207 lb (93.895 kg)    Physical Exam: Filed Vitals:   12/26/14 1146  BP: 120/82  Pulse: 84  The patient appears to be in no distress.  Head and neck exam reveals that the pupils are equal and reactive.  The extraocular movements are full.  There is no scleral icterus.  Mouth and pharynx are benign.  No lymphadenopathy.  No carotid bruits.  The jugular venous pressure is normal.  Thyroid is not enlarged or tender.  Chest is clear to percussion and auscultation.  No rales or rhonchi.  Expansion of the chest is symmetrical.  Heart reveals a sharp aortic closure sound.  There is no aortic insufficiency.  There is no gallop rub or click.  The abdomen is soft and nontender.  Bowel sounds are normoactive.  There is no hepatosplenomegaly or mass.  There are no abdominal bruits.  Extremities reveal no phlebitis or edema.  Pedal pulses are good.  There is no cyanosis or clubbing.  Neurologic exam is normal strength and no lateralizing weakness.  No sensory deficits.  Integument reveals no rash EKG today shows normal sinus rhythm.  Since 08/17/12, heart rate is faster and the previous inferolateral T-wave changes have resolved  Assessment / Plan: 1. Aortic valve disease  with severe aortic stenosis and insufficiency. Now status post aortic valve replacement with #27 mm St. Jude mechanical valve. Valve is functioning well. He will need to stay on lifelong Coumadin. He will need routine SBE prophylaxis.   2. Coronary disease status post single vessel LIMA graft to the LAD.   3. History of DVT with factor V Leiden deficiency.  4. Reflex sympathetic dystrophy left lower extremity.  5. Chronic anemia.  He is followed by hematology at [facility] and requires periodic blood transfusions.  6. History of peptic ulcer disease status post gastrectomy.   7. Postoperative atrial fibrillation-resolved. No recurrence An: We will check a CBC and a basal metabolic panel today.  We will refill his Toprol XL 100 mg tablets which she takes once  a day.  We will update his two-dimensional echocardiogram.  We will plan to see him back in 6 months for office visit and EKG

## 2014-12-27 NOTE — Progress Notes (Signed)
Quick Note:  Please report to patient. The recent labs are stable. Continue same medication and careful diet. No anemia. Hemoglobin is 16.4 ______

## 2015-01-02 ENCOUNTER — Other Ambulatory Visit: Payer: Medicaid Other

## 2015-01-02 ENCOUNTER — Telehealth: Payer: Self-pay | Admitting: Family Medicine

## 2015-01-02 DIAGNOSIS — D6851 Activated protein C resistance: Secondary | ICD-10-CM

## 2015-01-02 DIAGNOSIS — Z86711 Personal history of pulmonary embolism: Secondary | ICD-10-CM | POA: Diagnosis not present

## 2015-01-02 DIAGNOSIS — Z954 Presence of other heart-valve replacement: Secondary | ICD-10-CM | POA: Diagnosis not present

## 2015-01-02 DIAGNOSIS — D6859 Other primary thrombophilia: Secondary | ICD-10-CM | POA: Diagnosis not present

## 2015-01-02 DIAGNOSIS — Z952 Presence of prosthetic heart valve: Secondary | ICD-10-CM

## 2015-01-02 LAB — PROTIME-INR
INR: 1.96 — ABNORMAL HIGH (ref <1.50)
Prothrombin Time: 22.5 seconds — ABNORMAL HIGH (ref 11.6–15.2)

## 2015-01-02 NOTE — Progress Notes (Signed)
Subjective:    Patient ID: Frederick Payne, male    DOB: 11/05/1960, 54 y.o.   MRN: [id number]  HPI    Review of Systems     Objective:   Physical Exam        Assessment & Plan:  His PT/INR is now subtherapeutic. He states that if anything he has actually cut back on his greens. I will have him increase again to 14 mg and check later this week. He is also to set up an appointment to evaluate a skin lesion that is painful but previous biopsy was negative and did show a dermatofibroma

## 2015-01-02 NOTE — Telephone Encounter (Signed)
Pt wants to know what he is supposed to do now after Dr Redmond School has reviewed the biopsy and previous records from his dermatologist. Does pt need an appt with a dermatologist or Dr Redmond School?

## 2015-01-03 ENCOUNTER — Ambulatory Visit (HOSPITAL_COMMUNITY): Payer: Medicare Other | Attending: Cardiology | Admitting: Radiology

## 2015-01-03 DIAGNOSIS — Z952 Presence of prosthetic heart valve: Secondary | ICD-10-CM

## 2015-01-03 DIAGNOSIS — Z954 Presence of other heart-valve replacement: Secondary | ICD-10-CM | POA: Diagnosis not present

## 2015-01-03 NOTE — Progress Notes (Signed)
Echocardiogram performed.  

## 2015-01-09 ENCOUNTER — Telehealth: Payer: Self-pay | Admitting: Family Medicine

## 2015-01-09 NOTE — Telephone Encounter (Signed)
Pt states he has tried albuterol in the past with out success. Frederick Payne HFA approved til 01/04/16 P.A. # [id number], called pharmacy and went thru ins.  Called pt & informed.

## 2015-01-13 ENCOUNTER — Telehealth: Payer: Self-pay | Admitting: Family Medicine

## 2015-01-13 NOTE — Telephone Encounter (Signed)
Pt coming in for STD testing and PT INR on Monday

## 2015-01-13 NOTE — Telephone Encounter (Signed)
Brion Aliment with the [facility] Department called and stated that this pt had been exposed to syphilis. When she called to inform pt he stated that he got weekly blood test and if he had an issue it would of showed up. Chart was reviewed and the only testing he has regular is PT INR. She faxed over a notice of this and I am sending back for you to review.

## 2015-01-14 NOTE — Telephone Encounter (Signed)
Do an RPR on his last blood draw if we can or with his next blood draw.

## 2015-01-16 ENCOUNTER — Ambulatory Visit (INDEPENDENT_AMBULATORY_CARE_PROVIDER_SITE_OTHER): Payer: Medicare Other | Admitting: Family Medicine

## 2015-01-16 DIAGNOSIS — L989 Disorder of the skin and subcutaneous tissue, unspecified: Secondary | ICD-10-CM | POA: Diagnosis not present

## 2015-01-16 DIAGNOSIS — I259 Chronic ischemic heart disease, unspecified: Secondary | ICD-10-CM

## 2015-01-16 DIAGNOSIS — Z7901 Long term (current) use of anticoagulants: Secondary | ICD-10-CM

## 2015-01-16 DIAGNOSIS — Z209 Contact with and (suspected) exposure to unspecified communicable disease: Secondary | ICD-10-CM

## 2015-01-16 LAB — PROTIME-INR
INR: 1.96 — ABNORMAL HIGH (ref <1.50)
Prothrombin Time: 22.5 seconds — ABNORMAL HIGH (ref 11.6–15.2)

## 2015-01-16 NOTE — Progress Notes (Signed)
Patient has appointment April 8th at 9 am with Lennie Odor patient is aware

## 2015-01-16 NOTE — Addendum Note (Signed)
Addended by: Denita Lung on: 01/16/2015 03:08 PM   Modules accepted: Orders

## 2015-01-16 NOTE — Progress Notes (Signed)
Subjective:    Patient ID: Frederick Payne, male    DOB: April 26, 1961, 54 y.o.   MRN: [id number]  HPI He is here for follow-up on his Coumadin. Also he has a painful right arm lesion. Previous evidence indicates this is benign but he says it has changed colors in the last 2 years and is now slightly painful. He also received a call from the health department concerning possible exposure to syphilis. He states that he has not had any sexual contact with anyone other than his partner in approximately 6 months. His partner has been checked twice during that timeframe with negative RPR. He does not complain of any STD related symptoms.   Review of Systems     Objective:   Physical Exam Alert and in no distress. A 1 and has centimeter raised dark lesion is noted on the right forearm       Assessment & Plan:  Skin lesion - Plan: Ambulatory referral to Dermatology  Anticoagulation monitoring, INR range 2.5-3.5 - Plan: Protime-INR, CANCELED: Protime-INR  Contact with or exposure to communicable disease - Plan: HIV antibody

## 2015-01-17 LAB — HIV ANTIBODY (ROUTINE TESTING W REFLEX): HIV 1&2 Ab, 4th Generation: NONREACTIVE

## 2015-01-17 LAB — RPR

## 2015-01-23 ENCOUNTER — Ambulatory Visit: Payer: Self-pay

## 2015-01-30 ENCOUNTER — Ambulatory Visit
Admission: RE | Admit: 2015-01-30 | Discharge: 2015-01-30 | Disposition: A | Discharge status: Home / Self Care | Payer: Medicare Other | Source: Ambulatory Visit | Attending: Medical | Admitting: Medical

## 2015-01-30 ENCOUNTER — Ambulatory Visit (INDEPENDENT_AMBULATORY_CARE_PROVIDER_SITE_OTHER): Payer: Medicare Other | Admitting: Medical

## 2015-01-30 ENCOUNTER — Encounter: Payer: Self-pay | Admitting: Medical

## 2015-01-30 VITALS — BP 110/70 | HR 70 | Temp 98.2°F | Resp 15 | Wt 208.0 lb

## 2015-01-30 DIAGNOSIS — R0602 Shortness of breath: Secondary | ICD-10-CM | POA: Diagnosis not present

## 2015-01-30 DIAGNOSIS — I259 Chronic ischemic heart disease, unspecified: Secondary | ICD-10-CM | POA: Diagnosis not present

## 2015-01-30 DIAGNOSIS — R0789 Other chest pain: Secondary | ICD-10-CM | POA: Diagnosis not present

## 2015-01-30 DIAGNOSIS — J0100 Acute maxillary sinusitis, unspecified: Secondary | ICD-10-CM

## 2015-01-30 DIAGNOSIS — R059 Cough, unspecified: Secondary | ICD-10-CM

## 2015-01-30 DIAGNOSIS — J4541 Moderate persistent asthma with (acute) exacerbation: Secondary | ICD-10-CM

## 2015-01-30 DIAGNOSIS — R05 Cough: Secondary | ICD-10-CM

## 2015-01-30 MED ORDER — BUDESONIDE-FORMOTEROL FUMARATE 160-4.5 MCG/ACT IN AERO: 2.0000 | INHALATION_SPRAY | Freq: Two times a day (BID) | RESPIRATORY_TRACT | Status: DC

## 2015-01-30 MED ORDER — AMOXICILLIN-POT CLAVULANATE 875-125 MG PO TABS: 1.0000 | ORAL_TABLET | Freq: Two times a day (BID) | ORAL | Status: DC

## 2015-01-30 MED ORDER — OXYCODONE HCL 5 MG PO TABS: 5.0000 mg | ORAL_TABLET | Freq: Four times a day (QID) | ORAL | Status: DC | PRN

## 2015-01-30 NOTE — Progress Notes (Signed)
Subjective:  Frederick Payne is a 54 y.o. male who presents for possible bronchitis.  Symptoms include about a week history of cough, congestion, shortness of breath and wheezing, sinus pressure like a hot knife sticking in his face, feels like something in his lungs, coughing so much it feels like he has done a prolonged ab work out and has quite significant chest wall pain from the cough.  Not sleeping much since the last several days due to all the coughing. Takes Asmanex 2 puffs daily in the morning, using Xopenex about every 2 hours, does have a history of asthma. He is on Coumadin for factor V Leiden and artificial heart valve.  Denies palpitations, edema, hemoptysis.  + sick contacts.  No other aggravating or relieving factors.  No other c/o.  The following portions of the patient's history were reviewed and updated as appropriate: allergies, current medications, past family history, past medical history, past social history, past surgical history and problem list.  ROS as in subjective  Past Medical History  Diagnosis Date  . Hypertension   . Psychiatric disorder     Underlying  . Deep vein thrombosis 2006    After a broken ankle--recently diagnosed with reflex sympathetic dystrophy of the left lower extremity  . Aortic stenosis     Had surgery performed  . Ischemic heart disease   . CAD (coronary artery disease)   . Factor V Leiden   . Chronic anemia   . Reflex sympathetic dystrophy   . PUD (peptic ulcer disease)     with bleeding  . Elevated LFTs     h/o excessive alcohol     Objective: BP 110/70 mmHg  Pulse 70  Temp(Src) 98.2 F (36.8 C) (Oral)  Resp 15  Wt 208 lb (94.348 kg)  SpO2 97%  General appearance: Alert, WD/WN, no distress, ill appearing                             Skin: warm, no rash, no diaphoresis                           Head: no sinus tenderness                            Eyes: conjunctiva normal, corneas clear, PERRLA                            Ears:  pearly TMs, external ear canals normal                          Nose: septum midline, turbinates swollen, with erythema and clear discharge             Mouth/throat: MMM, tongue normal, mild pharyngeal erythema                           Neck: supple, no adenopathy, no thyromegaly, nontender                          Heart: RRR, normal S1, S2, no murmurs                         Lungs: +bronchial breath sounds, +scattered rhonchi, no  wheezes, no rales                Extremities: no edema, nontender     Assessment: Encounter Diagnoses  Name Primary?  . Cough Yes  . Chest wall pain   . Acute maxillary sinusitis, recurrence not specified   . Asthma with acute exacerbation, moderate persistent       Plan:  Discussed his symptoms which suggest sinusitis, asthma exacerbation, chest wall and abdomen pain due to coughing so much.  Oxycodone for cough suppression since allergy to tylenol.   Sample of Symbicort given to begin instead of Asmanex for the time being, continue Xopenex when necessary, rest, good hydration. Go for chest x-ray, and we will call results. Of note he is on Coumadin.  Advise if much worse in the next few days particularly if can breathing go to the emergency department

## 2015-02-03 DIAGNOSIS — L570 Actinic keratosis: Secondary | ICD-10-CM | POA: Diagnosis not present

## 2015-02-03 DIAGNOSIS — D2361 Other benign neoplasm of skin of right upper limb, including shoulder: Secondary | ICD-10-CM | POA: Diagnosis not present

## 2015-02-03 DIAGNOSIS — D485 Neoplasm of uncertain behavior of skin: Secondary | ICD-10-CM | POA: Diagnosis not present

## 2015-02-07 ENCOUNTER — Encounter: Payer: Self-pay | Admitting: Family Medicine

## 2015-02-07 ENCOUNTER — Ambulatory Visit (INDEPENDENT_AMBULATORY_CARE_PROVIDER_SITE_OTHER): Payer: Medicare Other | Admitting: Family Medicine

## 2015-02-07 VITALS — BP 110/70 | HR 88 | Temp 98.2°F | Wt 206.0 lb

## 2015-02-07 DIAGNOSIS — M79605 Pain in left leg: Secondary | ICD-10-CM

## 2015-02-07 DIAGNOSIS — R059 Cough, unspecified: Secondary | ICD-10-CM

## 2015-02-07 DIAGNOSIS — R05 Cough: Secondary | ICD-10-CM

## 2015-02-07 DIAGNOSIS — Z7901 Long term (current) use of anticoagulants: Secondary | ICD-10-CM

## 2015-02-07 DIAGNOSIS — I259 Chronic ischemic heart disease, unspecified: Secondary | ICD-10-CM | POA: Diagnosis not present

## 2015-02-07 DIAGNOSIS — J4541 Moderate persistent asthma with (acute) exacerbation: Secondary | ICD-10-CM

## 2015-02-07 LAB — PROTIME-INR
INR: 1.99 — ABNORMAL HIGH (ref <1.50)
Prothrombin Time: 22.8 seconds — ABNORMAL HIGH (ref 11.6–15.2)

## 2015-02-07 MED ORDER — LEVOFLOXACIN 500 MG PO TABS: 500.0000 mg | ORAL_TABLET | Freq: Every day | ORAL | Status: DC

## 2015-02-07 NOTE — Progress Notes (Signed)
Patient was given two breathing treatments with Albuterol Sulfate Inhalation Solution 0.083% 2.5/3 ml NDC [id number] Lot: [id number] Exp: 12/16

## 2015-02-07 NOTE — Progress Notes (Signed)
Subjective:    Patient ID: Frederick Payne, male    DOB: 07-08-1961, 54 y.o.   MRN: [id number]  HPI He is here for multiple issues. He hasn't really is on Symbicort as well as Augmentin and states that he is no better. He is coughing constantly. He has not used his rescue inhaler very often stating he only uses it when he can't breathe. He also complains of left leg pain is concerned about possible DVT/blood clot. He continues on 15 mg of Coumadin and states his eating habits have remained constant.   Review of Systems     Objective:   Physical Exam Alert and in no distress. Tympanic membranes and canals are normal. Pharyngeal area is normal. Neck is supple without adenopathy or thyromegaly. Cardiac exam shows a regular sinus rhythm without murmurs or gallops. Lungs Show scattered wheezing that did improve after 2 courses of albuterol nebulizer. PT/INR is essentially unchanged. Left lower extremity does show distal tenderness but no swelling. Negative Homans sign. Pulses and good capillary refill. Previous x-ray was negative. His pulse ox did drop down to a low of 92 for short period time.      Assessment & Plan:  Anticoagulation monitoring, INR range 2.5-3.5 - Plan: Protime-INR  Cough  Asthma with acute exacerbation, moderate persistent - Plan: PR ALBUTEROL IPRATROP NON-COMP, PR ALBUTEROL IPRATROP NON-COMP, PR INHAL RX, AIRWAY OBST/DX SPUTUM INDUCT, levofloxacin (LEVAQUIN) 500 MG tablet, PR ALBUTEROL IPRATROP NON-COMP, PR ALBUTEROL IPRATROP NON-COMP, PR INHAL RX, AIRWAY OBST/DX SPUTUM INDUCT  Asthmatic bronchitis with exacerbation, moderate persistent  Left leg pain I'll have him increase his Coumadin to 17 mg and recheck this beginning of the week. Also discussed proper use of the inhaler and albuterol. Started him to not wait as long to treat his walking. I will switch to a different antibiotic.

## 2015-02-07 NOTE — Patient Instructions (Signed)
Use your inhaler more often for the coughing. When you finish the antibiotic if you are not totally back to normal call me.

## 2015-02-10 ENCOUNTER — Encounter: Payer: Self-pay | Admitting: Family Medicine

## 2015-03-02 DIAGNOSIS — C44519 Basal cell carcinoma of skin of other part of trunk: Secondary | ICD-10-CM | POA: Diagnosis not present

## 2015-03-02 DIAGNOSIS — L570 Actinic keratosis: Secondary | ICD-10-CM | POA: Diagnosis not present

## 2015-03-20 DIAGNOSIS — I82409 Acute embolism and thrombosis of unspecified deep veins of unspecified lower extremity: Secondary | ICD-10-CM | POA: Diagnosis not present

## 2015-03-20 DIAGNOSIS — Z952 Presence of prosthetic heart valve: Secondary | ICD-10-CM | POA: Diagnosis not present

## 2015-03-20 DIAGNOSIS — Z87891 Personal history of nicotine dependence: Secondary | ICD-10-CM | POA: Diagnosis not present

## 2015-03-20 DIAGNOSIS — E610 Copper deficiency: Secondary | ICD-10-CM | POA: Diagnosis not present

## 2015-03-20 DIAGNOSIS — D509 Iron deficiency anemia, unspecified: Secondary | ICD-10-CM | POA: Diagnosis not present

## 2015-03-20 DIAGNOSIS — Z885 Allergy status to narcotic agent status: Secondary | ICD-10-CM | POA: Diagnosis not present

## 2015-03-20 DIAGNOSIS — Z886 Allergy status to analgesic agent status: Secondary | ICD-10-CM | POA: Diagnosis not present

## 2015-03-20 DIAGNOSIS — Z7901 Long term (current) use of anticoagulants: Secondary | ICD-10-CM | POA: Diagnosis not present

## 2015-03-20 DIAGNOSIS — D61818 Other pancytopenia: Secondary | ICD-10-CM | POA: Diagnosis not present

## 2015-03-20 DIAGNOSIS — Z882 Allergy status to sulfonamides status: Secondary | ICD-10-CM | POA: Diagnosis not present

## 2015-03-21 ENCOUNTER — Encounter: Payer: Self-pay | Admitting: Medical

## 2015-03-21 ENCOUNTER — Ambulatory Visit (INDEPENDENT_AMBULATORY_CARE_PROVIDER_SITE_OTHER): Payer: Medicare Other | Admitting: Medical

## 2015-03-21 VITALS — BP 120/80 | HR 58 | Temp 97.5°F | Resp 15 | Wt 209.0 lb

## 2015-03-21 DIAGNOSIS — I259 Chronic ischemic heart disease, unspecified: Secondary | ICD-10-CM

## 2015-03-21 DIAGNOSIS — J309 Allergic rhinitis, unspecified: Secondary | ICD-10-CM

## 2015-03-21 DIAGNOSIS — Z7901 Long term (current) use of anticoagulants: Secondary | ICD-10-CM | POA: Diagnosis not present

## 2015-03-21 DIAGNOSIS — R431 Parosmia: Secondary | ICD-10-CM

## 2015-03-21 DIAGNOSIS — R439 Unspecified disturbances of smell and taste: Secondary | ICD-10-CM

## 2015-03-21 DIAGNOSIS — J321 Chronic frontal sinusitis: Secondary | ICD-10-CM | POA: Diagnosis not present

## 2015-03-21 DIAGNOSIS — R432 Parageusia: Secondary | ICD-10-CM | POA: Diagnosis not present

## 2015-03-21 DIAGNOSIS — R0981 Nasal congestion: Secondary | ICD-10-CM

## 2015-03-21 MED ORDER — BECLOMETHASONE DIPROPIONATE 80 MCG/ACT NA AERS: 1.0000 | INHALATION_SPRAY | Freq: Two times a day (BID) | NASAL | Status: DC

## 2015-03-21 NOTE — Progress Notes (Signed)
Subjective:  Frederick Payne is a 54 y.o. male who presents for ongoing sinus pressure, cough, 3 wk hx/o unable to smell or taste anything, sinuses are burning, lots of head congestion, sore throat some, ears popping.  Has been here twice in recent month or 2 for same.  Despite 2 rounds of antibiotics, not resolving.     Also was seen by hematology yesterday in [place], INR was 5, was advised to hold coumadin x 3 days and recheck INR here.  He has been on 16mg  Coumadin daily the last month or so, and was on 12mg  daily prior to this.   Been up and down with INR control..  Goal is suppose to be 2.5-3.5.   The following portions of the patient's history were reviewed and updated as appropriate: allergies, current medications, past family history, past medical history, past social history, past surgical history and problem list.  ROS as in subjective  Past Medical History  Diagnosis Date  . Hypertension   . Psychiatric disorder     Underlying  . Deep vein thrombosis 2006    After a broken ankle--recently diagnosed with reflex sympathetic dystrophy of the left lower extremity  . Aortic stenosis     Had surgery performed  . Ischemic heart disease   . CAD (coronary artery disease)   . Factor V Leiden   . Chronic anemia   . Reflex sympathetic dystrophy   . PUD (peptic ulcer disease)     with bleeding  . Elevated LFTs     h/o excessive alcohol     Objective: BP 120/80 mmHg  Pulse 58  Temp(Src) 97.5 F (36.4 C) (Oral)  Resp 15  Wt 209 lb (94.802 kg)  General appearance: Alert, WD/WN, no distress, ill appearing                             Skin: warm, no rash, no diaphoresis                           Head: + sinus tenderness                            Eyes: conjunctiva normal, corneas clear, PERRLA                            Ears: pearly TMs, external ear canals normal                          Nose: septum midline, turbinates swollen, with erythema and mucoid discharge  Mouth/throat: MMM, tongue normal, mild pharyngeal erythema                           Neck: supple, no adenopathy, no thyromegaly, nontender                          Heart: RRR, normal S1, S2, no murmurs                         Lungs: clear, no rhonchi, no wheezes, no rales                Extremities: no edema, nontender     Assessment: Encounter Diagnoses  Name Primary?  . Chronic frontal sinusitis Yes  . Head congestion   . Allergic rhinitis, unspecified allergic rhinitis type   . Long term current use of anticoagulant therapy   . Smell disturbance   . Disordered taste       Plan:  Sinus CT ordered.  Discussed his symptoms which suggest chronic sinusitis.  Discussed neti pot, nasal sprays, supportive measures.  F/u pending CT sinus.    Long term anticoagulation.   C/t to hold coumadin the next 2 days and return for PT/INR in 2 days.   Return sooner prn.

## 2015-03-22 ENCOUNTER — Ambulatory Visit
Admission: RE | Admit: 2015-03-22 | Discharge: 2015-03-22 | Disposition: A | Discharge status: Home / Self Care | Payer: Medicare Other | Source: Ambulatory Visit | Attending: Medical | Admitting: Medical

## 2015-03-22 ENCOUNTER — Encounter: Payer: Self-pay | Admitting: Medical

## 2015-03-22 ENCOUNTER — Other Ambulatory Visit: Payer: Self-pay | Admitting: Medical

## 2015-03-22 DIAGNOSIS — Z7901 Long term (current) use of anticoagulants: Secondary | ICD-10-CM

## 2015-03-22 DIAGNOSIS — R0981 Nasal congestion: Secondary | ICD-10-CM

## 2015-03-22 DIAGNOSIS — J309 Allergic rhinitis, unspecified: Secondary | ICD-10-CM

## 2015-03-22 DIAGNOSIS — J329 Chronic sinusitis, unspecified: Secondary | ICD-10-CM | POA: Diagnosis not present

## 2015-03-22 DIAGNOSIS — J321 Chronic frontal sinusitis: Secondary | ICD-10-CM

## 2015-03-22 DIAGNOSIS — R439 Unspecified disturbances of smell and taste: Secondary | ICD-10-CM

## 2015-03-22 MED ORDER — PREDNISONE 10 MG (21) PO TBPK: 10.0000 mg | ORAL_TABLET | Freq: Every day | ORAL | Status: DC

## 2015-03-22 MED ORDER — CEFUROXIME AXETIL 500 MG PO TABS: 500.0000 mg | ORAL_TABLET | Freq: Two times a day (BID) | ORAL | Status: DC

## 2015-03-23 ENCOUNTER — Telehealth: Payer: Self-pay | Admitting: Medical

## 2015-03-23 ENCOUNTER — Other Ambulatory Visit: Payer: Medicare Other

## 2015-03-23 DIAGNOSIS — Z7901 Long term (current) use of anticoagulants: Secondary | ICD-10-CM

## 2015-03-23 LAB — PROTIME-INR
INR: 1.98 — ABNORMAL HIGH (ref <1.50)
Prothrombin Time: 22.5 seconds — ABNORMAL HIGH (ref 11.6–15.2)

## 2015-03-23 NOTE — Progress Notes (Unsigned)
Pt just started taking a prednisone in the last 2 days. Pt has not changed any new food diet or anything. Pt has not been on his coumadin in the last 3 days due to his pt/inr being 5.9, his hematologist told him this. Pt will start back on his coumadin tonight.  He is taking 16mg  daily. 1 tablet that is 10mg  and 3 tablets that is 2mg  each. He is taking his coumadin at night.

## 2015-03-23 NOTE — Telephone Encounter (Signed)
Patient aware of all.

## 2015-03-23 NOTE — Telephone Encounter (Signed)
INR down under 2 now.  Restart coumadin at 12mg  daily, use one 10mg  tablet + one 2 mg tablet.  Recheck INR next week, mid to late in week.   Discussed this with Dr. Redmond School and his pharmacist.

## 2015-04-03 ENCOUNTER — Ambulatory Visit (INDEPENDENT_AMBULATORY_CARE_PROVIDER_SITE_OTHER): Payer: Medicare Other | Admitting: Family Medicine

## 2015-04-03 ENCOUNTER — Encounter: Payer: Self-pay | Admitting: Family Medicine

## 2015-04-03 VITALS — BP 120/76 | HR 60 | Wt 206.0 lb

## 2015-04-03 DIAGNOSIS — H538 Other visual disturbances: Secondary | ICD-10-CM

## 2015-04-03 DIAGNOSIS — Z7901 Long term (current) use of anticoagulants: Secondary | ICD-10-CM

## 2015-04-03 DIAGNOSIS — I259 Chronic ischemic heart disease, unspecified: Secondary | ICD-10-CM | POA: Diagnosis not present

## 2015-04-03 LAB — PROTIME-INR
INR: 1.35 (ref <1.50)
Prothrombin Time: 16.7 seconds — ABNORMAL HIGH (ref 11.6–15.2)

## 2015-04-03 NOTE — Progress Notes (Signed)
Subjective:    Patient ID: Frederick Payne, male    DOB: December 02, 1960, 54 y.o.   MRN: [id number]  HPI He is here for evaluation of a two-week history of blurred vision only on the right. He states that the entire right eye is blurred. He's had no headache, fever, chills. He did not describe a curtainlike effect. Presently he is on Coumadin at apparently 16 mg per day although he is not positive. He states that he relies on his partner to lay his medications out. He states that his diet has been stable. He has been on prednisone for treatment of a sinus infection. He had a PT/INR that was elevated, he held his medication for several days and follow-up INR was subtherapeutic. He does not complain of any excessive bleeding.He remains alcohol free.   Review of Systems     Objective:   Physical Exam Alert and in no distress. No obvious ecchymosis is noted. EOMI. Conjunctiva normal. Fundus appears normal.       Assessment & Plan:  Long term current use of anticoagulant therapy - Plan: Protime-INR  Blurred vision, right eye Stressed the fact that he needs to be in control of his Coumadin dosing as well as his diet. He will be referred to ophthalmology. Scuffs the fact that if he has unilateral visual changes, he should always call me quickly.

## 2015-04-04 DIAGNOSIS — H2513 Age-related nuclear cataract, bilateral: Secondary | ICD-10-CM | POA: Diagnosis not present

## 2015-04-04 DIAGNOSIS — H16223 Keratoconjunctivitis sicca, not specified as Sjogren's, bilateral: Secondary | ICD-10-CM | POA: Diagnosis not present

## 2015-04-04 DIAGNOSIS — H04123 Dry eye syndrome of bilateral lacrimal glands: Secondary | ICD-10-CM | POA: Diagnosis not present

## 2015-04-05 ENCOUNTER — Telehealth: Payer: Self-pay | Admitting: Family Medicine

## 2015-04-05 NOTE — Telephone Encounter (Signed)
Recv'd letter from [facility] that Xopenex requiring P.A. For next fill.  Called pt and he no longer has Medicaid, he has only Medicare. Called pharmacy for ID# [id number] Did P.A. & states not required, authorization already exists.  [place] t#  [phone]

## 2015-04-05 NOTE — Telephone Encounter (Signed)
[employer] states need formulary exception,  This was completed over the phone t# [phone] & will receive a determination via fax within 78 hours

## 2015-04-07 ENCOUNTER — Ambulatory Visit: Payer: Self-pay

## 2015-04-07 NOTE — Telephone Encounter (Signed)
QNASL denied, pt needs trial of Flunisolide and Ipratropium.  Do you want to switch?

## 2015-04-07 NOTE — Telephone Encounter (Signed)
Approved til 10/28/15, pt informed.

## 2015-04-10 ENCOUNTER — Ambulatory Visit: Payer: Medicare Other

## 2015-04-10 NOTE — Telephone Encounter (Signed)
Insurance denied Qnasal.  Verify if he has used Flunisolide or Ipratropium nasal before . If not, we'll have to use one of these as alternative for nasal congestion/allergies.

## 2015-04-11 ENCOUNTER — Other Ambulatory Visit: Payer: Self-pay | Admitting: Medical

## 2015-04-11 MED ORDER — FLUNISOLIDE 25 MCG/ACT (0.025%) NA SOLN: 2.0000 | Freq: Two times a day (BID) | NASAL | Status: DC

## 2015-04-11 NOTE — Telephone Encounter (Signed)
Patient states that he has never used either one of those 2 nasal sprays before.  I went over the message and he is okay with try either one.

## 2015-04-13 DIAGNOSIS — L814 Other melanin hyperpigmentation: Secondary | ICD-10-CM | POA: Diagnosis not present

## 2015-04-13 DIAGNOSIS — B081 Molluscum contagiosum: Secondary | ICD-10-CM | POA: Diagnosis not present

## 2015-04-13 DIAGNOSIS — Z85828 Personal history of other malignant neoplasm of skin: Secondary | ICD-10-CM | POA: Diagnosis not present

## 2015-04-13 DIAGNOSIS — L570 Actinic keratosis: Secondary | ICD-10-CM | POA: Diagnosis not present

## 2015-04-13 DIAGNOSIS — Z08 Encounter for follow-up examination after completed treatment for malignant neoplasm: Secondary | ICD-10-CM | POA: Diagnosis not present

## 2015-04-14 ENCOUNTER — Other Ambulatory Visit: Payer: Self-pay | Admitting: Family Medicine

## 2015-06-07 ENCOUNTER — Other Ambulatory Visit: Payer: Self-pay | Admitting: Family Medicine

## 2015-06-22 ENCOUNTER — Other Ambulatory Visit: Payer: Self-pay | Admitting: Family Medicine

## 2015-06-29 ENCOUNTER — Other Ambulatory Visit: Payer: Self-pay | Admitting: Family Medicine

## 2015-06-29 ENCOUNTER — Other Ambulatory Visit: Payer: Self-pay | Admitting: Medical

## 2015-06-29 NOTE — Telephone Encounter (Signed)
IS THIS OKAY 

## 2015-06-29 NOTE — Telephone Encounter (Signed)
It looks like he hasn't had blood work since June. Have him come in to get stat PT/INR and find out his dosing regimen

## 2015-06-30 NOTE — Telephone Encounter (Signed)
Spoke with patient, not out of Coumadin yet, will make appt for INR check before refilling medication,

## 2015-07-04 ENCOUNTER — Telehealth: Payer: Self-pay

## 2015-07-04 ENCOUNTER — Ambulatory Visit: Payer: Medicare Other | Admitting: Family Medicine

## 2015-07-04 DIAGNOSIS — Z7901 Long term (current) use of anticoagulants: Secondary | ICD-10-CM | POA: Diagnosis not present

## 2015-07-04 LAB — PROTIME-INR
INR: 5.69 (ref <1.50)
Prothrombin Time: 52.7 seconds — ABNORMAL HIGH (ref 11.6–15.2)

## 2015-07-04 NOTE — Progress Notes (Signed)
Subjective:    Patient ID: Frederick Payne, male    DOB: 03-Jun-1961, 54 y.o.   MRN: [id number]  HPI    Review of Systems     Objective:   Physical Exam        Assessment & Plan:  He admits to not being compliant with taking his Coumadin. Presently he is on 12 mg and has had some epistaxis. He was informed to hold his Coumadin for 2 days and then drop back to 10 mg. He is to recheck here on Monday.

## 2015-07-04 NOTE — Telephone Encounter (Signed)
Pt informed and verbalized understanding

## 2015-07-04 NOTE — Telephone Encounter (Signed)
Refill request for Warfarin 2mg  #30

## 2015-07-04 NOTE — Telephone Encounter (Signed)
I cut him back to 10 mg so at this point he should not need to 2 mg dosing

## 2015-07-10 ENCOUNTER — Telehealth: Payer: Self-pay

## 2015-07-10 ENCOUNTER — Ambulatory Visit (INDEPENDENT_AMBULATORY_CARE_PROVIDER_SITE_OTHER): Payer: Medicare Other | Admitting: Family Medicine

## 2015-07-10 DIAGNOSIS — Z7901 Long term (current) use of anticoagulants: Secondary | ICD-10-CM

## 2015-07-10 LAB — PROTIME-INR
INR: 3.18 — ABNORMAL HIGH (ref <1.50)
Prothrombin Time: 33.3 seconds — ABNORMAL HIGH (ref 11.6–15.2)

## 2015-07-10 NOTE — Telephone Encounter (Signed)
PT INFORMED OF PT/INR STAY ON 10 MG PER DR.LALONDE RECHECK IN TWO WEEKS PT VERBALIZED UNDERSTANDING

## 2015-07-10 NOTE — Patient Instructions (Addendum)
Continue 10 mg dosing and recheck in 2 weeks

## 2015-07-13 ENCOUNTER — Other Ambulatory Visit: Payer: Self-pay | Admitting: Medical

## 2015-07-13 ENCOUNTER — Telehealth: Payer: Self-pay | Admitting: Family Medicine

## 2015-07-13 NOTE — Telephone Encounter (Signed)
Pt notified. Pt was given a sample to pick up as insurance will not fill this at pharmacy

## 2015-07-13 NOTE — Telephone Encounter (Signed)
Pt called and states you had told him to increase and  take asmanex 2 puffs 2 times a day and the pharmacy told him 2 puff one time a day, please clarify  instruction and and we will call patient and pharmacy,

## 2015-07-13 NOTE — Telephone Encounter (Signed)
Ok to RF? 

## 2015-07-13 NOTE — Telephone Encounter (Signed)
The dosing should be 2 puffs daily. He can take them at one time or 1 puff twice a day

## 2015-07-24 ENCOUNTER — Other Ambulatory Visit: Payer: Self-pay | Admitting: Family Medicine

## 2015-07-24 NOTE — Telephone Encounter (Signed)
IS THIS OKAY 

## 2015-07-25 ENCOUNTER — Ambulatory Visit (INDEPENDENT_AMBULATORY_CARE_PROVIDER_SITE_OTHER): Payer: Medicare Other | Admitting: Family Medicine

## 2015-07-25 DIAGNOSIS — Z7901 Long term (current) use of anticoagulants: Secondary | ICD-10-CM

## 2015-07-25 LAB — PROTIME-INR
INR: 3.79 — ABNORMAL HIGH (ref <1.50)
Prothrombin Time: 38.9 seconds — ABNORMAL HIGH (ref 11.6–15.2)

## 2015-07-25 NOTE — Progress Notes (Signed)
Subjective:    Patient ID: Frederick Payne, male    DOB: 10/01/1961, 54 y.o.   MRN: [id number]  HPI    Review of Systems     Objective:   Physical Exam        Assessment & Plan:  PT?INR is 3.79. I will instruct the patient to eat slightly more greens and recheck in one month.

## 2015-08-20 ENCOUNTER — Other Ambulatory Visit: Payer: Self-pay | Admitting: Family Medicine

## 2015-08-21 ENCOUNTER — Other Ambulatory Visit: Payer: Self-pay | Admitting: Family Medicine

## 2015-08-21 NOTE — Telephone Encounter (Signed)
Check with him on his correct dosing and if this prescription is appropriate, go ahead and renew it

## 2015-08-21 NOTE — Telephone Encounter (Signed)
Is this okay to refill? 

## 2015-08-29 DIAGNOSIS — D6832 Hemorrhagic disorder due to extrinsic circulating anticoagulants: Secondary | ICD-10-CM | POA: Insufficient documentation

## 2015-08-29 DIAGNOSIS — T45511A Poisoning by anticoagulants, accidental (unintentional), initial encounter: Secondary | ICD-10-CM | POA: Diagnosis not present

## 2015-08-29 DIAGNOSIS — S199XXA Unspecified injury of neck, initial encounter: Secondary | ICD-10-CM | POA: Diagnosis not present

## 2015-08-29 DIAGNOSIS — E876 Hypokalemia: Secondary | ICD-10-CM | POA: Diagnosis not present

## 2015-08-29 DIAGNOSIS — S0990XA Unspecified injury of head, initial encounter: Secondary | ICD-10-CM | POA: Diagnosis not present

## 2015-08-29 DIAGNOSIS — T45515A Adverse effect of anticoagulants, initial encounter: Secondary | ICD-10-CM

## 2015-08-29 DIAGNOSIS — E041 Nontoxic single thyroid nodule: Secondary | ICD-10-CM | POA: Insufficient documentation

## 2015-08-29 DIAGNOSIS — D689 Coagulation defect, unspecified: Secondary | ICD-10-CM | POA: Diagnosis not present

## 2015-08-29 DIAGNOSIS — I2510 Atherosclerotic heart disease of native coronary artery without angina pectoris: Secondary | ICD-10-CM | POA: Insufficient documentation

## 2015-08-30 DIAGNOSIS — D689 Coagulation defect, unspecified: Secondary | ICD-10-CM | POA: Diagnosis not present

## 2015-08-30 DIAGNOSIS — S0990XA Unspecified injury of head, initial encounter: Secondary | ICD-10-CM | POA: Diagnosis not present

## 2015-08-30 DIAGNOSIS — T45511A Poisoning by anticoagulants, accidental (unintentional), initial encounter: Secondary | ICD-10-CM | POA: Diagnosis not present

## 2015-08-31 DIAGNOSIS — S0990XA Unspecified injury of head, initial encounter: Secondary | ICD-10-CM | POA: Diagnosis not present

## 2015-08-31 DIAGNOSIS — T45511A Poisoning by anticoagulants, accidental (unintentional), initial encounter: Secondary | ICD-10-CM | POA: Diagnosis not present

## 2015-08-31 DIAGNOSIS — D689 Coagulation defect, unspecified: Secondary | ICD-10-CM | POA: Diagnosis not present

## 2015-09-03 ENCOUNTER — Other Ambulatory Visit: Payer: Self-pay | Admitting: Family Medicine

## 2015-09-04 ENCOUNTER — Ambulatory Visit (INDEPENDENT_AMBULATORY_CARE_PROVIDER_SITE_OTHER): Payer: Medicare Other | Admitting: Family Medicine

## 2015-09-04 DIAGNOSIS — S0181XA Laceration without foreign body of other part of head, initial encounter: Secondary | ICD-10-CM

## 2015-09-04 DIAGNOSIS — I259 Chronic ischemic heart disease, unspecified: Secondary | ICD-10-CM | POA: Diagnosis not present
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
## 2015-09-04 NOTE — Telephone Encounter (Signed)
Is this okay?

## 2015-09-05 ENCOUNTER — Other Ambulatory Visit: Payer: Medicare Other

## 2015-09-05 ENCOUNTER — Other Ambulatory Visit: Payer: Self-pay

## 2015-09-05 DIAGNOSIS — Z7901 Long term (current) use of anticoagulants: Secondary | ICD-10-CM | POA: Diagnosis not present

## 2015-09-05 LAB — PROTIME-INR
INR: 1.63 — ABNORMAL HIGH (ref <1.50)
Prothrombin Time: 19.6 seconds — ABNORMAL HIGH (ref 11.6–15.2)

## 2015-09-05 MED ORDER — WARFARIN SODIUM 2 MG PO TABS: 2.0000 mg | ORAL_TABLET | Freq: Every day | ORAL | Status: DC

## 2015-09-12 ENCOUNTER — Other Ambulatory Visit: Payer: Self-pay

## 2015-09-19 ENCOUNTER — Ambulatory Visit (INDEPENDENT_AMBULATORY_CARE_PROVIDER_SITE_OTHER): Payer: Medicare Other | Admitting: Family Medicine

## 2015-09-19 ENCOUNTER — Encounter: Payer: Self-pay | Admitting: Family Medicine

## 2015-09-19 VITALS — BP 100/70 | HR 88 | Temp 98.2°F

## 2015-09-19 DIAGNOSIS — I259 Chronic ischemic heart disease, unspecified: Secondary | ICD-10-CM

## 2015-09-19 DIAGNOSIS — F1010 Alcohol abuse, uncomplicated: Secondary | ICD-10-CM | POA: Diagnosis not present

## 2015-09-19 DIAGNOSIS — R002 Palpitations: Secondary | ICD-10-CM

## 2015-09-19 DIAGNOSIS — J0110 Acute frontal sinusitis, unspecified: Secondary | ICD-10-CM

## 2015-09-19 DIAGNOSIS — Z7901 Long term (current) use of anticoagulants: Secondary | ICD-10-CM | POA: Diagnosis not present

## 2015-09-19 DIAGNOSIS — F1011 Alcohol abuse, in remission: Secondary | ICD-10-CM | POA: Insufficient documentation

## 2015-09-19 MED ORDER — AZITHROMYCIN 500 MG PO TABS: 500.0000 mg | ORAL_TABLET | Freq: Every day | ORAL | Status: DC

## 2015-09-19 NOTE — Progress Notes (Signed)
Subjective:    Patient ID: Frederick Payne, male    DOB: [birth year], 54 y.o.   MRN: [id number]  HPI He complains of a one-week history starting with malaise followed by a scratchy throat, sneezing, nasal congestion, palpitations, blood in the postnasal drainage with sinus pain. No upper tooth discomfort. He did on a couple of occasions have alcohol to help with the pain. He continues on Coumadin.   Review of Systems     Objective:   Physical Exam Alert and in no distress. Tympanic membranes and canals are normal. Pharyngeal area is normal. Neck is supple without adenopathy or thyromegaly. Cardiac exam shows a regular sinus rhythm without murmurs or gallops, without S2 heard. Lungs are clear to auscultation. Nasal mucosa is red. He is very tender over frontal and maxillary sinuses.        Assessment & Plan:  Acute frontal sinusitis, recurrence not specified - Plan: azithromycin (ZITHROMAX) 500 MG tablet  Long-term (current) use of anticoagulants  History of alcohol abuse  Palpitations  I again cautioned him to avoid all alcohol. I will cut back on his Coumadin while he is on the azithromycin. He is to call me after week and let me know how he is doing. I reassured him that at this point I found no cause of concern for the palpitations.

## 2015-09-19 NOTE — Patient Instructions (Signed)
Cut back on the Coumadin to 10 mg while you're on this azithromycin. If after a week you're not back to normal get it refilled. If you run into bleeding problems call me

## 2015-09-20 ENCOUNTER — Other Ambulatory Visit: Payer: Self-pay | Admitting: Family Medicine

## 2015-09-22 ENCOUNTER — Other Ambulatory Visit: Payer: Self-pay | Admitting: Family Medicine

## 2015-09-25 NOTE — Telephone Encounter (Signed)
Is this okay?

## 2015-10-05 DIAGNOSIS — R531 Weakness: Secondary | ICD-10-CM | POA: Diagnosis not present

## 2015-10-05 DIAGNOSIS — R4182 Altered mental status, unspecified: Secondary | ICD-10-CM | POA: Diagnosis not present

## 2015-10-13 ENCOUNTER — Other Ambulatory Visit: Payer: Self-pay | Admitting: Family Medicine

## 2015-10-17 ENCOUNTER — Ambulatory Visit: Payer: Medicare Other | Admitting: Family Medicine

## 2015-10-17 ENCOUNTER — Other Ambulatory Visit: Payer: Self-pay | Admitting: Family Medicine

## 2015-10-17 DIAGNOSIS — Z7901 Long term (current) use of anticoagulants: Secondary | ICD-10-CM

## 2015-10-17 LAB — PROTIME-INR
INR: 1.72 — ABNORMAL HIGH (ref <1.50)
Prothrombin Time: 20.6 seconds — ABNORMAL HIGH (ref 11.6–15.2)

## 2015-10-17 NOTE — Progress Notes (Signed)
His PT/INR is  Subtherapeutic.Presently he is on 10 mg. I called him and told him to increase to12 mg and recheck in one week

## 2015-10-25 ENCOUNTER — Other Ambulatory Visit: Payer: Medicare Other

## 2015-10-25 DIAGNOSIS — Z7901 Long term (current) use of anticoagulants: Secondary | ICD-10-CM

## 2015-10-25 LAB — PROTIME-INR
INR: 2.86 — ABNORMAL HIGH (ref <1.50)
Prothrombin Time: 30.7 seconds — ABNORMAL HIGH (ref 11.6–15.2)

## 2015-11-01 DIAGNOSIS — H16223 Keratoconjunctivitis sicca, not specified as Sjogren's, bilateral: Secondary | ICD-10-CM | POA: Diagnosis not present

## 2015-11-01 DIAGNOSIS — H04123 Dry eye syndrome of bilateral lacrimal glands: Secondary | ICD-10-CM | POA: Diagnosis not present

## 2015-11-01 DIAGNOSIS — H2513 Age-related nuclear cataract, bilateral: Secondary | ICD-10-CM | POA: Diagnosis not present

## 2015-11-02 ENCOUNTER — Encounter: Payer: Self-pay | Admitting: Physician Assistant

## 2015-11-02 ENCOUNTER — Ambulatory Visit (INDEPENDENT_AMBULATORY_CARE_PROVIDER_SITE_OTHER): Payer: Medicare Other | Admitting: Physician Assistant

## 2015-11-02 VITALS — BP 144/85 | HR 50 | Ht 68.5 in | Wt 209.1 lb

## 2015-11-02 DIAGNOSIS — I493 Ventricular premature depolarization: Secondary | ICD-10-CM | POA: Diagnosis not present

## 2015-11-02 DIAGNOSIS — R55 Syncope and collapse: Secondary | ICD-10-CM | POA: Diagnosis not present

## 2015-11-02 DIAGNOSIS — Z952 Presence of prosthetic heart valve: Secondary | ICD-10-CM

## 2015-11-02 DIAGNOSIS — Z954 Presence of other heart-valve replacement: Secondary | ICD-10-CM | POA: Diagnosis not present

## 2015-11-02 DIAGNOSIS — I2510 Atherosclerotic heart disease of native coronary artery without angina pectoris: Secondary | ICD-10-CM

## 2015-11-02 MED ORDER — METOPROLOL SUCCINATE ER 50 MG PO TB24: 50.0000 mg | ORAL_TABLET | Freq: Every day | ORAL | Status: DC

## 2015-11-02 NOTE — Progress Notes (Signed)
Cardiology Office Note   Date:  11/02/2015   ID:  Frederick Payne, DOB October 29, 1960, MRN [id number]  PCP:  Carollee Herter, MD  Cardiologist:  Dr. [place]  Abdulrahim Siddiqi, PA-C   Chief Complaint  Patient presents with  . Advice Only    patient complains of frequent PVC's for 4 weeks    History of Present Illness: Frederick Payne is a 55 y.o. male with a history of Factor V Leiden deficiency on coumadin, DVT/PE, IVC in place, AVR w/ St Jude mech valve, HTN, RSD, anemia, CABG w/ LIMA-LAD, PUD w/ gastrectomy  Frederick Payne presents for evaluation after having syncope and palpitations that have been diagnosed as PVCs in the past.  Frederick Payne has had 2 episodes of syncope in the last year. The last one was in October and he was in a hospital in [facility] for 4 days. He had a superficial head injury from falling onto a tile floor. The bleeding was extremely difficult to control because his INR was 11 on admission. His partner has also taken his blood pressure during one of these events and the systolic was 84. A heart rate is not available.  He has regular episodes of orthostatic dizziness but has only passed out twice.  He has PVCs on a regular basis. There are frequent. It sometimes feel like his heart is dropping. Sometimes they cause pain. He does not get any other cardiac symptoms. He has no new dyspnea on exertion, no lower extremity edema and no other chest pain.   Past Medical History  Diagnosis Date  . Hypertension   . Psychiatric disorder     Underlying  . Deep vein thrombosis (HCC) 2006    After a broken ankle--recently diagnosed with reflex sympathetic dystrophy of the left lower extremity  . Aortic stenosis     Had surgery performed  . Ischemic heart disease   . CAD (coronary artery disease)   . Factor V Leiden (HCC)   . Chronic anemia   . Reflex sympathetic dystrophy   . PUD (peptic ulcer disease)     with bleeding  . Elevated LFTs     h/o  excessive alcohol    Past Surgical History  Procedure Laterality Date  . Gallbladder surgery  2008  . Aortic valve replacement  07/15/12    27 mm St Jude  . Coronary artery bypass graft      x1  . Gastrectomy and vagotomy    . [place]field filter      Current Outpatient Prescriptions  Medication Sig Dispense Refill  . ascorbic acid (VITAMIN C) 500 MG tablet Take 500 mg by mouth daily.    [name] aspirin 81 MG tablet Take 81 mg by mouth daily.    [name] CALCIUM PO Take 1 tablet by mouth daily.     . Copper Gluconate (COPPER CAPS) 2 MG CAPS Take by mouth every evening.    . ferrous sulfate 325 (65 FE) MG tablet TAKE 1 TABLET BY MOUTH EVERY DAY 90 tablet 0  . fish oil-omega-3 fatty acids 1000 MG capsule Take 1 g by mouth daily.    . flunisolide (NASALIDE) 25 MCG/ACT (0.025%) SOLN Place 2 sprays into the nose 2 (two) times daily. 1 Bottle 1  . metoprolol succinate (TOPROL-XL) 100 MG 24 hr tablet Take 1 tablet (100 mg total) by mouth at bedtime. Take with or immediately following a meal. 30 tablet 11  . mometasone (ASMANEX 60 METERED DOSES) 220 MCG/INH inhaler Inhale 2  puffs into the lungs daily. 1 Inhaler 12  . Multiple Vitamins-Minerals (ONE-A-DAY MENS 50+ ADVANTAGE PO) Take by mouth daily.    [name] POLICOSANOL PO Take 20 mg by mouth daily.    [name] POLY-IRON 150 150 MG capsule TAKE 1 CAPSULE BY MOUTH EVERY DAY 90 capsule 0  . warfarin (COUMADIN) 10 MG tablet TAKE 1 TABLET BY MOUTH EVERY DAY 30 tablet 0  . warfarin (COUMADIN) 10 MG tablet TAKE 1 TABLET BY MOUTH EVERY DAY 30 tablet 0  . XOPENEX HFA 45 MCG/ACT inhaler INHALE 1-2 PUFFS INTO THE LUNGS EVERY 4-6 HOURS FOR WHEEZING OR SHORTNESS OF BREATH 15 g 0   No current facility-administered medications for this visit.    Allergies:   Benzodiazepines; Contrast media; Pectin; Shellfish allergy; Sulfa antibiotics; and Tylenol    Social History:  The patient  reports that he has quit smoking. He does not have any smokeless tobacco history on file. He reports  that he does not drink alcohol or use illicit drugs.   Family History:  The patient's family history includes Stroke in his paternal grandmother.    ROS:  Please see the history of present illness. All other systems are reviewed and negative.    PHYSICAL EXAM: VS:  BP 144/85 mmHg  Pulse 50  Ht 5' 8.5" (1.74 m)  Wt 209 lb 1.6 oz (94.847 kg)  BMI 31.33 kg/m2 , BMI Body mass index is 31.33 kg/(m^2). GEN: Well nourished, well developed, male in no acute distress HEENT: normal for age  Neck: no JVD, no carotid bruit, no masses Cardiac: RRR;faint  murmur, no rubs, or gallops Respiratory:  clear to auscultation bilaterally, normal work of breathing GI: soft, nontender, nondistended, + BS MS: no deformity or atrophy; no edema; distal pulses are 2+ in all 4 extremities  Skin: warm and dry, no rash; head went from November has healed well.  Neuro:  Strength and sensation are intact Psych: euthymic mood, full affect   EKG:  EKG is ordered today. The ekg ordered today demonstrates sinus bradycardia, heart rate 50, no acute ischemic changes, borderline LVH   Echo: 01/03/2015 - Left ventricle: The cavity size was normal. Systolic function was normal. The estimated ejection fraction was in the range of 60% to 65%. Wall motion was normal; there were no regional wall motion abnormalities. Features are consistent with a pseudonormal left ventricular filling pattern, with concomitant abnormal relaxation and increased filling pressure (grade 2 diastolic dysfunction). There was no evidence of elevated ventricular filling pressure by Doppler parameters. - Aortic valve: A mechanical valve sits well in the aortic position. There is no central aortic regurgitation and only minimal paravalvular leak. Transaortic gradients are normal for this type of prosthesis. - Aortic root: The aortic root was normal in size. - Mitral valve: There was no significant regurgitation. Valve  area by continuity equation (using LVOT flow): 2.89 cm^2. - Left atrium: The atrium was mildly dilated. - Right ventricle: Systolic function was normal. - Tricuspid valve: There was trivial regurgitation. - Pulmonic valve: There was trivial regurgitation. - Pulmonary arteries: Systolic pressure was within the normal range. - Inferior vena cava: The vessel was normal in size. - Pericardium, extracardiac: There was no pericardial effusion.  Recent Labs: 11/17/2014: ALT 59* 12/26/2014: BUN 13; Creatinine, Ser 1.00; Hemoglobin 16.4; Platelets 193.0; Potassium 4.1; Sodium 138    Lipid Panel No results found for: CHOL, TRIG, HDL, CHOLHDL, VLDL, LDLCALC, LDLDIRECT   Wt Readings from Last 3 Encounters:  11/02/15 209 lb 1.6  oz (94.847 kg)  04/03/15 206 lb (93.441 kg)  03/21/15 209 lb (94.802 kg)     Other studies Reviewed: Additional studies/ records that were reviewed today include: Office notes and other records available.  ASSESSMENT AND PLAN:  1.  Syncope: He has had some documented hypotension and today has documented bradycardia. He also has orthostatic dizziness. He states he drinks plenty of water and does not feel that hydration is an issue. We will decrease his metoprolol to 50 mg daily. We will get an event monitor looking for arrhythmia or additional bradycardia.   2. PVCs: The frequency of PVCs may increase with decreasing his beta blocker, they may also be escaped beats and may actually decrease with allowing his baseline heart rate to be higher. The event monitor will determine this.  3. AVR and CAD: He is not having any true ischemic symptoms. We will check an echocardiogram to make sure his valve is still functioning normally and his EF is preserved.  4. Factor V Leiden deficiency, chronic anticoagulation: His Coumadin is followed by his primary care physician.   Current medicines are reviewed at length with the patient today.  The patient does not have concerns  regarding medicines.  The following changes have been made:  Decrease metoprolol   Labs/ tests ordered today include:   Orders Placed This Encounter  Procedures  . Cardiac event monitor  . EKG 12-Lead  . ECHOCARDIOGRAM COMPLETE     Disposition:   FU with Dr. [place] or myself after the event monitor  Signed, Leanna Battles  11/02/2015 12:51 PM    [facility] [address] [place], [place], [place]  [address] Phone: [phone]; Fax: [phone]

## 2015-11-02 NOTE — Patient Instructions (Signed)
Your physician has recommended that you wear an event monitor. Event monitors are medical devices that record the heart's electrical activity. Doctors most often [place] these monitors to diagnose arrhythmias. Arrhythmias are problems with the speed or rhythm of the heartbeat. The monitor is a small, portable device. You can wear one while you do your normal daily activities. This is usually used to diagnose what is causing palpitations/syncope (passing out).  Your physician has requested that you have an echocardiogram. Echocardiography is a painless test that uses sound waves to create images of your heart. It provides your doctor with information about the size and shape of your heart and how well your heart's chambers and valves are working. This procedure takes approximately one hour. There are no restrictions for this procedure.   Your physician has recommended you make the following change in your medication: THE METOPROLOL SUCC HAS BEEN DECREASED TO 50 MG DAILY.  Your physician recommends that you schedule a follow-up appointment in: 1 month with Dr Martin[place] or Rosaria Ferries.

## 2015-11-03 DIAGNOSIS — R002 Palpitations: Secondary | ICD-10-CM | POA: Diagnosis not present

## 2015-11-06 ENCOUNTER — Telehealth: Payer: Self-pay

## 2015-11-06 NOTE — Telephone Encounter (Signed)
Do you want him to make an appt for Thursday or Monday since you're off Friday?

## 2015-11-06 NOTE — Telephone Encounter (Signed)
Pt i scheduled for thursday

## 2015-11-06 NOTE — Telephone Encounter (Signed)
Pt informed. He also checked it again today and it was 3.6. He has made an appt for Friday.

## 2015-11-06 NOTE — Telephone Encounter (Signed)
Pt called to let you know that his INR was checked yesterday and it was 5.6. He did not take his Warfarin last night. He wants to know if there is anything you want him to do?

## 2015-11-06 NOTE — Telephone Encounter (Signed)
LM to CB

## 2015-11-06 NOTE — Telephone Encounter (Signed)
Tell him to hold it again tonight and then start back. Make sure that he is eating his normal amounts of greens and let's check it Friday

## 2015-11-06 NOTE — Telephone Encounter (Signed)
Thursday

## 2015-11-09 ENCOUNTER — Other Ambulatory Visit: Payer: Medicare Other

## 2015-11-09 DIAGNOSIS — Z7901 Long term (current) use of anticoagulants: Secondary | ICD-10-CM

## 2015-11-09 LAB — PROTIME-INR
INR: 2.38 — ABNORMAL HIGH (ref <1.50)
Prothrombin Time: 26.3 seconds — ABNORMAL HIGH (ref 11.6–15.2)

## 2015-11-10 ENCOUNTER — Telehealth: Payer: Self-pay | Admitting: Cardiology

## 2015-11-10 ENCOUNTER — Encounter: Payer: Medicare Other | Admitting: Physician Assistant

## 2015-11-13 ENCOUNTER — Other Ambulatory Visit: Payer: Self-pay

## 2015-11-13 ENCOUNTER — Ambulatory Visit (HOSPITAL_COMMUNITY): Payer: Medicare Other | Attending: Cardiovascular Disease

## 2015-11-13 DIAGNOSIS — I493 Ventricular premature depolarization: Secondary | ICD-10-CM | POA: Insufficient documentation

## 2015-11-13 DIAGNOSIS — I351 Nonrheumatic aortic (valve) insufficiency: Secondary | ICD-10-CM | POA: Diagnosis not present

## 2015-11-13 DIAGNOSIS — I10 Essential (primary) hypertension: Secondary | ICD-10-CM

## 2015-11-13 DIAGNOSIS — Z954 Presence of other heart-valve replacement: Secondary | ICD-10-CM | POA: Diagnosis not present

## 2015-11-13 DIAGNOSIS — Z951 Presence of aortocoronary bypass graft: Secondary | ICD-10-CM | POA: Diagnosis not present

## 2015-11-14 MED ORDER — METOPROLOL SUCCINATE ER 25 MG PO TB24: 25.0000 mg | ORAL_TABLET | Freq: Every day | ORAL | Status: DC

## 2015-11-14 NOTE — Telephone Encounter (Signed)
Received a call from patient 11/10/15.He stated he woke up at 3:30 am dizzy.B/P 100/68.Stated he was wearing event monitor.[employer] faxed monitor strip from 3:30 am.Rhonda Barrett PA reviewed.Revealed sinus rhythm rate 66.Suanne Marker advised to decrease Metoprolol to 25 mg daily.Advised to continue to monitor B/P.Keep appointment with Rosaria Ferries PA 12/15/15 at 9:30 am.Advised bring B/P diary to visit,call sooner if needed.

## 2015-11-21 ENCOUNTER — Other Ambulatory Visit: Payer: Self-pay | Admitting: Family Medicine

## 2015-11-21 NOTE — Telephone Encounter (Signed)
Is this okay to refill? 

## 2015-12-11 ENCOUNTER — Other Ambulatory Visit: Payer: Medicare Other

## 2015-12-15 ENCOUNTER — Encounter: Payer: Medicare Other | Admitting: Physician Assistant

## 2015-12-15 ENCOUNTER — Other Ambulatory Visit: Payer: Self-pay | Admitting: *Deleted

## 2015-12-15 NOTE — Progress Notes (Signed)
Cardiology Office Note   Date:  12/15/2015   ID:  Frederick Payne, DOB 15-Jun-1961, MRN [id number]  PCP:  Carollee Herter, MD  Cardiologist:  Dr [place]  Barrett, Rhonda, PA-C   No chief complaint on file.   History of Present Illness: Frederick Payne is a 55 y.o. male with a history of Factor V Leiden deficiency on coumadin, DVT/PE, IVC in place, AVR w/ St Jude mech valve, HTN, RSD, anemia, CABG w/ LIMA-LAD, PUD w/ gastrectomy.  01/05 office visit for syncope, palpitations. Event monitor ordered.  Frederick Payne presents for followup   Past Medical History  Diagnosis Date  . Hypertension   . Psychiatric disorder     Underlying  . Deep vein thrombosis (HCC) 2006    After a broken ankle--recently diagnosed with reflex sympathetic dystrophy of the left lower extremity  . Aortic stenosis     Had surgery performed  . Ischemic heart disease   . CAD (coronary artery disease)   . Factor V Leiden (HCC)   . Chronic anemia   . Reflex sympathetic dystrophy   . PUD (peptic ulcer disease)     with bleeding  . Elevated LFTs     h/o excessive alcohol    Past Surgical History  Procedure Laterality Date  . Gallbladder surgery  2008  . Aortic valve replacement  07/15/12    27 mm St Jude  . Coronary artery bypass graft      x1  . Gastrectomy and vagotomy    . Greenfield filter      Current Outpatient Prescriptions  Medication Sig Dispense Refill  . ascorbic acid (VITAMIN C) 500 MG tablet Take 500 mg by mouth daily.    [name] aspirin 81 MG tablet Take 81 mg by mouth daily.    [name] CALCIUM PO Take 1 tablet by mouth daily.     . Copper Gluconate (COPPER CAPS) 2 MG CAPS Take by mouth every evening.    . ferrous sulfate 325 (65 FE) MG tablet TAKE 1 TABLET BY MOUTH EVERY DAY 90 tablet 0  . fish oil-omega-3 fatty acids 1000 MG capsule Take 1 g by mouth daily.    . flunisolide (NASALIDE) 25 MCG/ACT (0.025%) SOLN Place 2 sprays into the nose 2 (two) times daily. 1 Bottle 1  . metoprolol  succinate (TOPROL XL) 25 MG 24 hr tablet Take 1 tablet (25 mg total) by mouth daily. 30 tablet 6  . mometasone (ASMANEX 60 METERED DOSES) 220 MCG/INH inhaler Inhale 2 puffs into the lungs daily. 1 Inhaler 12  . Multiple Vitamins-Minerals (ONE-A-DAY MENS 50+ ADVANTAGE PO) Take by mouth daily.    [name] POLICOSANOL PO Take 20 mg by mouth daily.    [name] POLY-IRON 150 150 MG capsule TAKE 1 CAPSULE BY MOUTH EVERY DAY 90 capsule 0  . warfarin (COUMADIN) 10 MG tablet TAKE 1 TABLET BY MOUTH EVERY DAY 30 tablet 0  . warfarin (COUMADIN) 10 MG tablet TAKE 1 TABLET BY MOUTH EVERY DAY 30 tablet 0  . XOPENEX HFA 45 MCG/ACT inhaler INHALE 1-2 PUFFS INTO THE LUNGS EVERY 4-6 HOURS FOR WHEEZING OR SHORTNESS OF BREATH 15 g 0   No current facility-administered medications for this visit.    Allergies:   Benzodiazepines; Contrast media; Pectin; Shellfish allergy; Sulfa antibiotics; and Tylenol    Social History:  The patient  reports that he has quit smoking. He does not have any smokeless tobacco history on file. He reports that he does not drink alcohol or use  illicit drugs.   Family History:  The patient's family history includes Stroke in his paternal grandmother.    ROS:  Please see the history of present illness. All other systems are reviewed and negative.    PHYSICAL EXAM: VS:  There were no vitals taken for this visit. , BMI There is no weight on file to calculate BMI. GEN: Well nourished, well developed, male in no acute distress HEENT: normal for age  Neck: no JVD, no carotid bruit, no masses Cardiac: RRR; no murmur, no rubs, or gallops Respiratory:  clear to auscultation bilaterally, normal work of breathing GI: soft, nontender, nondistended, + BS MS: no deformity or atrophy; no edema; distal pulses are 2+ in all 4 extremities  Skin: warm and dry, no rash Neuro:  Strength and sensation are intact Psych: euthymic mood, full affect   EKG:  EKG  ordered today. The ekg ordered today demonstrates     Recent Labs: 12/26/2014: BUN 13; Creatinine, Ser 1.00; Hemoglobin 16.4; Platelets 193.0; Potassium 4.1; Sodium 138    Lipid Panel No results found for: CHOL, TRIG, HDL, CHOLHDL, VLDL, LDLCALC, LDLDIRECT   Wt Readings from Last 3 Encounters:  11/02/15 209 lb 1.6 oz (94.847 kg)  04/03/15 206 lb (93.441 kg)  03/21/15 209 lb (94.802 kg)     Other studies Reviewed: Additional studies/ records that were reviewed today include: .  ASSESSMENT AND PLAN:  1.     Current medicines are reviewed at length with the patient today.  The patient  concerns regarding medicines.  The following changes have been made:    Labs/ tests ordered today include:  No orders of the defined types were placed in this encounter.     Disposition:   FU with   Tawny Asal  12/15/2015 10:22 AM    [facility] [address] [place], [place], [place]  [address] Phone: [phone]; Fax: [phone]     This encounter was created in error - please disregard.

## 2015-12-20 ENCOUNTER — Encounter: Payer: Self-pay | Admitting: *Deleted

## 2015-12-21 ENCOUNTER — Other Ambulatory Visit: Payer: Self-pay | Admitting: Family Medicine

## 2015-12-26 ENCOUNTER — Other Ambulatory Visit: Payer: Self-pay | Admitting: Family Medicine

## 2015-12-27 ENCOUNTER — Encounter: Payer: Self-pay | Admitting: Medical

## 2015-12-27 ENCOUNTER — Ambulatory Visit (INDEPENDENT_AMBULATORY_CARE_PROVIDER_SITE_OTHER): Payer: Medicare Other | Admitting: Medical

## 2015-12-27 VITALS — BP 120/80 | HR 66 | Wt 207.0 lb

## 2015-12-27 DIAGNOSIS — L03032 Cellulitis of left toe: Secondary | ICD-10-CM | POA: Diagnosis not present

## 2015-12-27 DIAGNOSIS — Z7901 Long term (current) use of anticoagulants: Secondary | ICD-10-CM

## 2015-12-27 DIAGNOSIS — Z418 Encounter for other procedures for purposes other than remedying health state: Secondary | ICD-10-CM

## 2015-12-27 DIAGNOSIS — Z299 Encounter for prophylactic measures, unspecified: Secondary | ICD-10-CM

## 2015-12-27 DIAGNOSIS — I2510 Atherosclerotic heart disease of native coronary artery without angina pectoris: Secondary | ICD-10-CM

## 2015-12-27 MED ORDER — AMOXICILLIN 500 MG PO TABS: ORAL_TABLET | ORAL | Status: DC

## 2015-12-27 NOTE — Patient Instructions (Signed)
Paronychia °Paronychia is an infection of the skin that surrounds a nail. It usually affects the skin around a fingernail, but it may also occur near a toenail. It often causes pain and swelling around the nail. This condition may come on suddenly or develop over a longer period. In some cases, a collection of pus (abscess) can form near or under the nail. Usually, paronychia is not serious and it clears up with treatment. °CAUSES °This condition may be caused by bacteria or fungi. It is commonly caused by either Streptococcus or Staphylococcus bacteria. The bacteria or fungi often cause the infection by getting into the affected area through an opening in the skin, such as a cut or a hangnail. °RISK FACTORS °This condition is more likely to develop in: °· People who get their hands wet often, such as those who work as dishwashers, bartenders, or nurses. °· People who bite their fingernails or suck their thumbs. °· People who trim their nails too short. °· People who have hangnails or injured fingertips. °· People who get manicures. °· People who have diabetes. °SYMPTOMS °Symptoms of this condition include: °· Redness and swelling of the skin near the nail. °· Tenderness around the nail when you touch the area. °· Pus-filled bumps under the cuticle. The cuticle is the skin at the base or sides of the nail. °· Fluid or pus under the nail. °· Throbbing pain in the area. °DIAGNOSIS °This condition is usually diagnosed with a physical exam. In some cases, a sample of pus may be taken from an abscess to be tested in a lab. This can help to determine what type of bacteria or fungi is causing the condition. °TREATMENT °Treatment for this condition depends on the cause and severity of the condition. If the condition is mild, it may clear up on its own in a few days. Your health care provider may recommend soaking the affected area in warm water a few times a day. When treatment is needed, the options may  include: °· Antibiotic medicine, if the condition is caused by a bacterial infection. °· Antifungal medicine, if the condition is caused by a fungal infection. °· Incision and drainage, if an abscess is present. In this procedure, the health care provider will cut open the abscess so the pus can drain out. °HOME CARE INSTRUCTIONS °· Soak the affected area in warm water if directed to do so by your health care provider. You may be told to do this for 20 minutes, 2-3 times a day. Keep the area dry in between soakings. °· Take medicines only as directed by your health care provider. °· If you were prescribed an antibiotic medicine, finish all of it even if you start to feel better. °· Keep the affected area clean. °· Do not try to drain a fluid-filled bump yourself. °· If you will be washing dishes or performing other tasks that require your hands to get wet, wear rubber gloves. You should also wear gloves if your hands might come in contact with irritating substances, such as cleaners or chemicals. °· Follow your health care provider's instructions about: °¨ Wound care. °¨ Bandage (dressing) changes and removal. °SEEK MEDICAL CARE IF: °· Your symptoms get worse or do not improve with treatment. °· You have a fever or chills. °· You have redness spreading from the affected area. °· You have continued or increased fluid, blood, or pus coming from the affected area. °· Your finger or knuckle becomes swollen or is difficult to move. °  °  This information is not intended to replace advice given to you by your health care provider. Make sure you discuss any questions you have with your health care provider. °  °Document Released: 04/09/2001 Document Revised: 02/28/2015 Document Reviewed: 09/21/2014 °Elsevier Interactive Patient Education ©2016 [employer] ° °

## 2015-12-27 NOTE — Progress Notes (Signed)
Subjective: Chief Complaint  Patient presents with  . left bug toe pain    dog tail hit it and that was in august. said it leaks clear and white fluid when wears shoes. he has a lot of problems with his left leg already. very painful   Here for left toe pain and swelling.   Back in August his friend's pit bull they call iron tail hit his left great toe.   He reports have immediate pain and white bubble that formed under the toe.  Since then has continue to have a bubble under the toe, but in the last week having pain, swelling, redness and some pus drainage of left great toe.    No fever.  The left leg is the leg he has chronic pain in .  No other aggravating or relieving factors. No other complaint.  Past Medical History  Diagnosis Date  . Hypertension   . Psychiatric disorder     Underlying  . Deep vein thrombosis ([place]) 2006    After a broken ankle--recently diagnosed with reflex sympathetic dystrophy of the left lower extremity  . Aortic stenosis     Had surgery performed  . Ischemic heart disease   . CAD (coronary artery disease)   . Factor V Leiden ([place])   . Chronic anemia   . Reflex sympathetic dystrophy   . PUD (peptic ulcer disease)     with bleeding  . Elevated LFTs     h/o excessive alcohol   ROS as in subjective  Objective: BP 120/80 mmHg  Pulse 66  Wt 207 lb (93.895 kg)  Gen: wd, wn, nad Left great toe with adjacent toe soft tissue with fluctuance whitish round coloration of lateral nail, erythema under the nail, slight drainage, and tender to touch at distal lateral nail.  Rest of toe nontender without erythema or warmth   Assessment: Encounter Diagnoses  Name Primary?  . Paronychia of toe of left foot Yes  . Long term current use of anticoagulant therapy   . Need for prophylactic measure     Plan: discussed findings. No ingrowing nail, but paronychia.   Begin amoxicillin, hot soapy foot soaks, can use the bacitracin he has at home BID.   If not improving  or worse in the next 48 hours, will need to stop warfarin temporarily for 1-2 days and come in for I&D /paronychia.    Of note, he normally uses prophylactic antibiotics for procedures given hx/o valve replacement.  He is also on coumadin.

## 2016-01-06 ENCOUNTER — Other Ambulatory Visit: Payer: Self-pay | Admitting: Family Medicine

## 2016-01-13 ENCOUNTER — Other Ambulatory Visit: Payer: Self-pay | Admitting: Family Medicine

## 2016-01-15 NOTE — Telephone Encounter (Signed)
Is this ok to refill?  

## 2016-01-19 ENCOUNTER — Ambulatory Visit (INDEPENDENT_AMBULATORY_CARE_PROVIDER_SITE_OTHER): Payer: Medicare Other | Admitting: Physician Assistant

## 2016-01-19 ENCOUNTER — Encounter: Payer: Self-pay | Admitting: Physician Assistant

## 2016-01-19 VITALS — BP 110/82 | HR 103 | Ht 68.5 in | Wt 213.8 lb

## 2016-01-19 DIAGNOSIS — R55 Syncope and collapse: Secondary | ICD-10-CM | POA: Diagnosis not present

## 2016-01-19 DIAGNOSIS — I493 Ventricular premature depolarization: Secondary | ICD-10-CM

## 2016-01-19 DIAGNOSIS — I2510 Atherosclerotic heart disease of native coronary artery without angina pectoris: Secondary | ICD-10-CM

## 2016-01-19 NOTE — Patient Instructions (Signed)
Your physician recommends that you schedule a follow-up appointment in: 3 months with dr Martinique.  LIMIT ALCOHOL CONSUMPTION TO 1 OUNCE DAILY.

## 2016-01-19 NOTE — Progress Notes (Signed)
Cardiology Office Note   Date:  01/19/2016   ID:  Frederick Payne, DOB [birth year], MRN [id number]  PCP:  Carollee Herter, MD  Cardiologist:  Dr [place]  Barrett, Rhonda, PA-C   No chief complaint on file.   History of Present Illness: Frederick Payne is a 55 y.o. male with a history of Factor V Leiden deficiency on coumadin, DVT/PE, IVC in place, AVR w/ St Jude mech valve, HTN, RSD, anemia, CABG w/ LIMA-LAD, PUD w/ gastrectomy.  Seen in office for frequent PVCs, hx syncope. Event monitor placed, BB added. Echo OK, results below.  Frederick Payne presents for followup of his event monitor and echo.   He has been doing well from a cardiac standpoint. He has not been having chest pain or palpitations. He has not had any more presyncope or syncope. He feels better on the lower dose beta blocker. He is tachycardic and when asked about this, he admitted that he has been drinking too much because he is depressed. He is not drinking a great deal of caffeine. He does not drink energy drinks or coffee, but drinks tea all day long on a regular basis.  He feels that the only reason he drinks more than he should his depression. The depression has never been successfully treated as he has had bad reactions to medications and counselors that did not suit him.   Past Medical History  Diagnosis Date  . Hypertension   . Psychiatric disorder     Underlying  . Deep vein thrombosis (HCC) 2006    After a broken ankle--recently diagnosed with reflex sympathetic dystrophy of the left lower extremity  . Aortic stenosis     Had surgery performed  . Ischemic heart disease   . CAD (coronary artery disease)   . Factor V Leiden (HCC)   . Chronic anemia   . Reflex sympathetic dystrophy   . PUD (peptic ulcer disease)     with bleeding  . Elevated LFTs     h/o excessive alcohol    Past Surgical History  Procedure Laterality Date  . Gallbladder surgery  2008  . Aortic valve replacement  07/15/12    27 mm St Jude  . Coronary artery bypass graft      x1  . Gastrectomy and vagotomy    . Greenfield filter      Current Outpatient Prescriptions  Medication Sig Dispense Refill  . amoxicillin (AMOXIL) 500 MG tablet 2 tablets po BID x 10 days 40 tablet 0  . ascorbic acid (VITAMIN C) 500 MG tablet Take 500 mg by mouth daily.    . ASMANEX 60 METERED DOSES 220 MCG/INH inhaler INHALE 2 PUFFS INTO THE LUNGS DAILY 1 Inhaler 1  . aspirin 81 MG tablet Take 81 mg by mouth daily.    [name] CALCIUM PO Take 1 tablet by mouth daily.     . Copper Gluconate (COPPER CAPS) 2 MG CAPS Take by mouth every evening.    . ferrous sulfate 325 (65 FE) MG tablet TAKE 1 TABLET BY MOUTH EVERY DAY 90 tablet 0  . fish oil-omega-3 fatty acids 1000 MG capsule Take 1 g by mouth daily.    . flunisolide (NASALIDE) 25 MCG/ACT (0.025%) SOLN Place 2 sprays into the nose 2 (two) times daily. 1 Bottle 1  . metoprolol succinate (TOPROL-XL) 50 MG 24 hr tablet Take 50 mg by mouth daily. Take with or immediately following a meal.    . Multiple Vitamins-Minerals (ONE-A-DAY MENS 50+ ADVANTAGE PO)  Take by mouth daily.    [name] POLICOSANOL PO Take 20 mg by mouth daily.    [name] POLY-IRON 150 150 MG capsule TAKE 1 CAPSULE BY MOUTH EVERY DAY 90 capsule 3  . warfarin (COUMADIN) 10 MG tablet TAKE 1 TABLET BY MOUTH EVERY DAY 90 tablet 0  . warfarin (COUMADIN) 2 MG tablet TAKE 1 TABLET BY MOUTH DAILY 30 tablet 2  . XOPENEX HFA 45 MCG/ACT inhaler INHALE 1-2 PUFFS INTO THE LUNGS EVERY 4-6 HOURS FOR WHEEZING OR SHORTNESS OF BREATH 15 g 0   No current facility-administered medications for this visit.    Allergies:   Benzodiazepines; Contrast media; Iodine; Morphine and related; Other; Pectin; Shellfish allergy; Sulfa antibiotics; Tylenol; Codeine; Diazepam; Hydrocodone-acetaminophen; and Lactase    Social History:  The patient  reports that he has quit smoking. He has never used smokeless tobacco. He reports that he does not drink alcohol or use illicit  drugs.   Family History:  The patient's family history includes Healthy in his father; Stroke in his paternal grandmother.    ROS:  Please see the history of present illness. All other systems are reviewed and negative.    PHYSICAL EXAM: VS:  BP 110/82 mmHg  Pulse 103  Ht 5' 8.5" (1.74 m)  Wt 213 lb 12.8 oz (96.979 kg)  BMI 32.03 kg/m2 , BMI Body mass index is 32.03 kg/(m^2). GEN: Well nourished, well developed, male in no acute distress HEENT: normal for age  Neck: no JVD, no carotid bruit, no masses Cardiac: RRR; 2/6 murmur, no rubs, or gallops Respiratory:  clear to auscultation bilaterally, normal work of breathing GI: soft, nontender, nondistended, + BS MS: no deformity or atrophy; no edema; distal pulses are 2+ in all 4 extremities  Skin: warm and dry, no rash Neuro:  Strength and sensation are intact Psych: euthymic mood, full affect   EKG:  EKG is not ordered today.  ECHO: 11/03/2015 - Left ventricle: The cavity size was normal. Wall thickness was  normal. Systolic function was normal. The estimated ejection  fraction was in the range of 55% to 60%. - Aortic valve: Normal appearing mechanical AVR with stable trivial  peri valvular regurgitation. Valve area (VTI): 1.74 cm^2. Valve  area (Vmax): 1.8 cm^2. Valve area (Vmean): 1.71 cm^2. - Left atrium: The atrium was mildly dilated. - Atrial septum: No defect or patent foramen ovale was identified.   Recent Labs: No results found for requested labs within last 365 days.    Lipid Panel No results found for: CHOL, TRIG, HDL, CHOLHDL, VLDL, LDLCALC, LDLDIRECT   Wt Readings from Last 3 Encounters:  01/19/16 213 lb 12.8 oz (96.979 kg)  12/27/15 207 lb (93.895 kg)  11/02/15 209 lb 1.6 oz (94.847 kg)     Other studies Reviewed: Additional studies/ records that were reviewed today include: Office notes and testing.  ASSESSMENT AND PLAN:  1.  PVCs, history of syncope: The echocardiogram and the monitor have  shown no reason for the syncope. He was advised that the amount of alcohol he is drinking desperate distress on his body. He is encouraged to limit alcohol. He is to continue the beta blocker at decreased dose. Heart muscle function is normal and echo wall motion abnormalities, no further testing is indicated.  2. Depression: He has had side effects with medications for the depression and an adverse reaction to benzodiazepines. He is encouraged to contact his primary care physician to see if any help is available.   Current medicines are  reviewed at length with the patient today.  The patient does not have concerns regarding medicines.  The following changes have been made:  no change  Labs/ tests ordered today include:  No orders of the defined types were placed in this encounter.     Disposition:   FU with Dr. [place]  Signed, Leanna Battles  01/19/2016 10:49 AM    [facility] Phone: [phone]; Fax: [phone]  This note was written with the assistance of speech recognition software. Please excuse any transcriptional errors.

## 2016-01-23 DIAGNOSIS — R040 Epistaxis: Secondary | ICD-10-CM | POA: Diagnosis not present

## 2016-01-23 DIAGNOSIS — I517 Cardiomegaly: Secondary | ICD-10-CM | POA: Diagnosis not present

## 2016-01-23 DIAGNOSIS — K709 Alcoholic liver disease, unspecified: Secondary | ICD-10-CM | POA: Insufficient documentation

## 2016-01-23 DIAGNOSIS — Z952 Presence of prosthetic heart valve: Secondary | ICD-10-CM | POA: Diagnosis not present

## 2016-01-23 DIAGNOSIS — I519 Heart disease, unspecified: Secondary | ICD-10-CM | POA: Diagnosis not present

## 2016-01-23 DIAGNOSIS — F329 Major depressive disorder, single episode, unspecified: Secondary | ICD-10-CM | POA: Diagnosis not present

## 2016-01-23 DIAGNOSIS — I77810 Thoracic aortic ectasia: Secondary | ICD-10-CM | POA: Diagnosis not present

## 2016-01-23 DIAGNOSIS — D6832 Hemorrhagic disorder due to extrinsic circulating anticoagulants: Secondary | ICD-10-CM | POA: Diagnosis not present

## 2016-01-24 DIAGNOSIS — K709 Alcoholic liver disease, unspecified: Secondary | ICD-10-CM | POA: Diagnosis not present

## 2016-01-24 DIAGNOSIS — D689 Coagulation defect, unspecified: Secondary | ICD-10-CM | POA: Diagnosis not present

## 2016-01-24 DIAGNOSIS — R040 Epistaxis: Secondary | ICD-10-CM | POA: Diagnosis not present

## 2016-01-24 DIAGNOSIS — D682 Hereditary deficiency of other clotting factors: Secondary | ICD-10-CM | POA: Diagnosis not present

## 2016-01-24 DIAGNOSIS — T45511A Poisoning by anticoagulants, accidental (unintentional), initial encounter: Secondary | ICD-10-CM | POA: Diagnosis not present

## 2016-01-25 ENCOUNTER — Telehealth: Payer: Self-pay | Admitting: Cardiology

## 2016-01-25 DIAGNOSIS — E876 Hypokalemia: Secondary | ICD-10-CM | POA: Diagnosis not present

## 2016-01-25 DIAGNOSIS — I471 Supraventricular tachycardia: Secondary | ICD-10-CM | POA: Diagnosis not present

## 2016-01-25 DIAGNOSIS — D682 Hereditary deficiency of other clotting factors: Secondary | ICD-10-CM | POA: Diagnosis not present

## 2016-01-25 DIAGNOSIS — T45511A Poisoning by anticoagulants, accidental (unintentional), initial encounter: Secondary | ICD-10-CM | POA: Diagnosis not present

## 2016-01-25 DIAGNOSIS — K709 Alcoholic liver disease, unspecified: Secondary | ICD-10-CM | POA: Diagnosis not present

## 2016-01-25 DIAGNOSIS — D689 Coagulation defect, unspecified: Secondary | ICD-10-CM | POA: Diagnosis not present

## 2016-01-25 NOTE — Telephone Encounter (Signed)
Pt called in wanting to inform Dr. Martinique that he was admitted to [facility] for an MI and he is about to be transported to [facility] shortly .

## 2016-01-25 NOTE — Telephone Encounter (Signed)
Returned call to patient no answer.Left message on personal voice mail sorry to hear you are in hospital.Hope you are better soon.I will make Dr.Jordan aware.

## 2016-01-26 DIAGNOSIS — I2510 Atherosclerotic heart disease of native coronary artery without angina pectoris: Secondary | ICD-10-CM | POA: Diagnosis not present

## 2016-01-26 DIAGNOSIS — Z951 Presence of aortocoronary bypass graft: Secondary | ICD-10-CM | POA: Diagnosis not present

## 2016-01-26 DIAGNOSIS — E876 Hypokalemia: Secondary | ICD-10-CM | POA: Diagnosis not present

## 2016-01-29 ENCOUNTER — Telehealth: Payer: Self-pay | Admitting: Family Medicine

## 2016-01-29 NOTE — Telephone Encounter (Signed)
Pt called to schedule a hospital follow up. Pt made an appt for tomorrow. Pt states that the hospital added Lovenox to his medications. He is taking 93 twice a day. Pt also states that he has concerns over his Warfarin dose. He also states that while he was hospitalized he had to have a plasma transfusion. Pt states that he has access to his medical info thru the pt portal and will bring make sure he has labs done [facility]. Pt was informed to bring all medications with him.

## 2016-01-30 ENCOUNTER — Ambulatory Visit (INDEPENDENT_AMBULATORY_CARE_PROVIDER_SITE_OTHER): Payer: Medicare Other | Admitting: Family Medicine

## 2016-01-30 ENCOUNTER — Encounter: Payer: Self-pay | Admitting: Family Medicine

## 2016-01-30 VITALS — BP 118/68 | HR 64 | Wt 213.4 lb

## 2016-01-30 DIAGNOSIS — I2510 Atherosclerotic heart disease of native coronary artery without angina pectoris: Secondary | ICD-10-CM

## 2016-01-30 DIAGNOSIS — F32A Depression, unspecified: Secondary | ICD-10-CM

## 2016-01-30 DIAGNOSIS — Z7901 Long term (current) use of anticoagulants: Secondary | ICD-10-CM

## 2016-01-30 DIAGNOSIS — G90522 Complex regional pain syndrome I of left lower limb: Secondary | ICD-10-CM | POA: Diagnosis not present

## 2016-01-30 DIAGNOSIS — Z954 Presence of other heart-valve replacement: Secondary | ICD-10-CM | POA: Diagnosis not present

## 2016-01-30 DIAGNOSIS — D6851 Activated protein C resistance: Secondary | ICD-10-CM

## 2016-01-30 DIAGNOSIS — G894 Chronic pain syndrome: Secondary | ICD-10-CM

## 2016-01-30 DIAGNOSIS — Z952 Presence of prosthetic heart valve: Secondary | ICD-10-CM

## 2016-01-30 DIAGNOSIS — D6859 Other primary thrombophilia: Secondary | ICD-10-CM | POA: Diagnosis not present

## 2016-01-30 DIAGNOSIS — F1011 Alcohol abuse, in remission: Secondary | ICD-10-CM

## 2016-01-30 DIAGNOSIS — F1010 Alcohol abuse, uncomplicated: Secondary | ICD-10-CM

## 2016-01-30 DIAGNOSIS — F329 Major depressive disorder, single episode, unspecified: Secondary | ICD-10-CM | POA: Diagnosis not present

## 2016-01-30 LAB — PROTIME-INR
INR: 1.15 (ref <1.50)
Prothrombin Time: 15 seconds (ref 11.6–15.2)

## 2016-01-30 NOTE — Progress Notes (Signed)
Subjective:    Patient ID: Frederick Payne, male    DOB: 11/01/1960, 55 y.o.   MRN: [id number]  HPI He is here for consult concerning recent hospitalization and treatment for alcohol abuse, onset of atrial fibrillation. He started drinking and apparently stopped taking his Coumadin however he did check his PT/INR at home from someone who also has a machine and found it to be quite elevated. He then went to the emergency room. He was started on Lovenox as well as placed back on Coumadin at 15 mg. He has a history of factor V Leiden, DVT/PE. The DVT then caused difficulty with RSD and he now has chronic pain from this. He has been seen in pain clinic and is not interested in pain medication. He would like to be considered for a nerve stimulator. Presently he is having no difficulty with excessive bleeding, chest pain, leg pain. He does have a long history of difficulty with low self-esteem and periodic episodes of depression causing him to start drinking and stop his medication regimen. He is not interested in antidepressant medications.  Review of Systems     Objective:   Physical Exam Alert and in no distress. Appropriately dressed with appropriate affect.The hospital record including discharge summary was reviewed.       Assessment & Plan:  History of alcohol abuse  Long term current use of anticoagulant therapy - Plan: Protime-INR, Ambulatory referral to Hematology  Factor V Leiden (HCC) - Plan: Protime-INR, Ambulatory referral to Hematology  Chronic pain syndrome  S/P AVR  RSD lower limb, left I again discussed the need for him to stop alcohol. He is also to eat regular doses of greens as well as stay on a good multivitamin. Explained that we can adjust his Coumadin levels based on his eating habits.He will be referred to pain clinic for possible nerve stimulator. I'll also refer him to hematology to discuss his factor V Leiden and the fact that he would also like to be able to check  his PT/INR at home. He will also be referred to [facility] for counseling concerning his underlying depression and very low self-esteem. His most recent PT/INR is not therapeutic. He will continue on his present dosing of 15 mg daily and I will recheck this on Friday and possibly stop the Lovenox at that time.

## 2016-02-01 ENCOUNTER — Encounter: Payer: Self-pay | Admitting: Physician Assistant

## 2016-02-01 ENCOUNTER — Telehealth: Payer: Self-pay | Admitting: Family Medicine

## 2016-02-01 ENCOUNTER — Ambulatory Visit (INDEPENDENT_AMBULATORY_CARE_PROVIDER_SITE_OTHER): Payer: Medicare Other | Admitting: Physician Assistant

## 2016-02-01 VITALS — BP 100/80 | HR 60 | Ht 68.5 in | Wt 210.0 lb

## 2016-02-01 DIAGNOSIS — I259 Chronic ischemic heart disease, unspecified: Secondary | ICD-10-CM

## 2016-02-01 DIAGNOSIS — I2510 Atherosclerotic heart disease of native coronary artery without angina pectoris: Secondary | ICD-10-CM | POA: Diagnosis not present

## 2016-02-01 DIAGNOSIS — I4892 Unspecified atrial flutter: Secondary | ICD-10-CM | POA: Diagnosis not present

## 2016-02-01 DIAGNOSIS — I493 Ventricular premature depolarization: Secondary | ICD-10-CM

## 2016-02-01 DIAGNOSIS — G894 Chronic pain syndrome: Secondary | ICD-10-CM

## 2016-02-01 NOTE — Patient Instructions (Addendum)
Medication Instructions:  Your physician recommends that you continue on your current medications as directed. Please refer to the Current Medication list given to you today.   Labwork: None ordered  Testing/Procedures: None ordered  Follow-Up: Your physician recommends that you schedule a follow-up appointment WITH DR. Martinique 1ST AVAILABLE   Any Other Special Instructions Will Be Listed Below (If Applicable).    If you need a refill on your cardiac medications before your next appointment, please call your pharmacy.

## 2016-02-01 NOTE — Progress Notes (Signed)
Cardiology Office Note   Date:  02/01/2016   ID:  Frederick Payne, DOB 08-05-1961, MRN [id number]  PCP:  Frederick Herter, MD  Cardiologist:  Dr [place]  Payne, Rhonda, PA-C   Chief Complaint  Patient presents with  . Follow-up    Having shortness of breath, cardioverte in hospital due to tachycardia, rapid atrial flutter    History of Present Illness: Frederick Payne is a 55 y.o. male with a history of Factor V Leiden deficiency on coumadin, DVT/PE, IVC in place, AVR w/ St Jude mech valve, HTN, RSD, anemia, CABG w/ LIMA-LAD, PUD w/ gastrectomy.  Frederick Payne presents for Follow-up after his hospitalization at [facility].  He went to the hospital and [facility] because of a nosebleed. Of note, he had recently been binge drinking. His INR was greater than 10. His INR was reversed, but he developed atrial flutter with ST elevation and chest discomfort. Adenosine slowed the flutter long enough to see the flutter waves clearly, but he did not convert. Because of the chest pain and ST elevation, he had a synchronized cardioversion with one shock at 120 J.  Because of the chest pain and abnormal ECG, he was transferred to for side catheterization. The cardiac catheterization showed no critical disease and he was discharged home. He is on Lovenox to transition back to Coumadin.  He notes that since that trip to the hospital, he has greatly decreased his alcohol consumption. He understands that that level of alcohol is harmful to him.  This is the first episode of atrial flutter he's had it 3 years. He gets occasional skipped beats, but nothing sustained like the atrial flutter. He has no history of presyncope or syncope.  Since he was cardioverted, he has had no further episodes of chest pain. Because of the arrhythmia, he was told to follow-up with cardiology for consideration of additional treatments.  Since the hospitalization, he feels a little tired and sometimes feels like he has  trouble getting a deep breath.   Past Medical History  Diagnosis Date  . Hypertension   . Psychiatric disorder     Underlying  . Deep vein thrombosis (HCC) 2006    After a broken ankle--recently diagnosed with reflex sympathetic dystrophy of the left lower extremity  . Aortic stenosis     Had surgery performed  . Ischemic heart disease   . CAD (coronary artery disease)   . Factor V Leiden (HCC)   . Chronic anemia   . Reflex sympathetic dystrophy   . PUD (peptic ulcer disease)     with bleeding  . Elevated LFTs     h/o excessive alcohol    Past Surgical History  Procedure Laterality Date  . Gallbladder surgery  2008  . Aortic valve replacement  07/15/12    27 mm St Jude  . Coronary artery bypass graft      x1  . Gastrectomy and vagotomy    . Greenfield filter      Current Outpatient Prescriptions  Medication Sig Dispense Refill  . ascorbic acid (VITAMIN C) 500 MG tablet Take 500 mg by mouth daily.    . ASMANEX 60 METERED DOSES 220 MCG/INH inhaler INHALE 2 PUFFS INTO THE LUNGS DAILY 1 Inhaler 1  . aspirin 81 MG tablet Take 81 mg by mouth daily.    [name] CALCIUM PO Take 1 tablet by mouth daily.     . Copper Gluconate (COPPER CAPS) 2 MG CAPS Take by mouth every evening.    [name]  enoxaparin (LOVENOX) 100 MG/ML injection Inject 100 mg into the skin.    . ferrous sulfate 325 (65 FE) MG tablet TAKE 1 TABLET BY MOUTH EVERY DAY 90 tablet 0  . fish oil-omega-3 fatty acids 1000 MG capsule Take 1 g by mouth daily.    [name] Lifitegrast (XIIDRA) 5 % SOLN     . metoprolol succinate (TOPROL-XL) 50 MG 24 hr tablet Take 50 mg by mouth daily. Take with or immediately following a meal.    . Multiple Vitamins-Minerals (ONE-A-DAY MENS 50+ ADVANTAGE PO) Take by mouth daily.    [name] POLICOSANOL PO Take 20 mg by mouth daily.    [name] POLY-IRON 150 150 MG capsule TAKE 1 CAPSULE BY MOUTH EVERY DAY 90 capsule 3  . ST JOHNS WORT PO Take by mouth daily.    [name] warfarin (COUMADIN) 10 MG tablet TAKE 1 TABLET BY MOUTH  EVERY DAY 90 tablet 0  . warfarin (COUMADIN) 2 MG tablet TAKE 1 TABLET BY MOUTH DAILY 30 tablet 2  . warfarin (COUMADIN) 5 MG tablet Take 5 mg by mouth daily.    Frederick Payne 45 MCG/ACT inhaler INHALE 1-2 PUFFS INTO THE LUNGS EVERY 4-6 HOURS FOR WHEEZING OR SHORTNESS OF BREATH 15 g 0   No current facility-administered medications for this visit.    Allergies:   Benzodiazepines; Contrast media; Iodine; Morphine and related; Other; Pectin; Shellfish allergy; Sulfa antibiotics; Tylenol; Codeine; Diazepam; Hydrocodone-acetaminophen; and Lactose intolerance (gi)    Social History:  The patient  reports that he has quit smoking. He has never used smokeless tobacco. He reports that he does not drink alcohol or use illicit drugs.   Family History:  The patient's family history includes Healthy in his father; Stroke in his paternal grandmother.    ROS:  Please see the history of present illness. All other systems are reviewed and negative.    PHYSICAL EXAM: VS:  BP 100/80 mmHg  Pulse 60  Ht 5' 8.5" (1.74 m)  Wt 210 lb (95.255 kg)  BMI 31.46 kg/m2 , BMI Body mass index is 31.46 kg/(m^2). GEN: Well nourished, well developed, male in no acute distress HEENT: normal for age  Neck: no JVD, carotid bruit, but radiation of murmur to both carotids, no masses Cardiac: RRR; crisp valve click with a soft murmur, no rubs, or gallops Respiratory:  clear to auscultation bilaterally, normal work of breathing GI: soft, nontender, nondistended, + BS MS: no deformity or atrophy; no edema; distal pulses are 2+ in all 4 extremities  Skin: warm and dry, no rash Neuro:  Strength and sensation are intact Psych: euthymic mood, full affect   EKG:  EKG is ordered today. The ekg ordered today demonstrates sinus rhythm, rate 60 with minimal voltage criteria for LVH.  ECHO: 01/23/2016 There is mild aortic root dilatation. The left ventricular wall motion is normal. The left ventricular ejection fraction is  normal (55-60%). The prosthetic aortic valve is not well visualized. Peak and mean gradients across the aortic valve of 10 and 6 mmHg The mitral valve leaflets appear calcified, but not stenotic. Grade I mild diastolic dysfunction; abnormal relaxation pattern. The left atrium is mildly dilated. There is normal left ventricular wall thickness  CATH: 01/26/2016 Coronary arteriograms and bypass graft angiogram 1. Left main- normal 2. Left anterior descending- 30% proximal; D1- normal 3. Left circumflex- 20% mid; OM1- 20% proximal 4. Right coronary- dominant vessel which is normal 5. Left internal mammary artery bypass graft LAD is widely patent.  CONCLUSIONS: 1.  Two-vessel nonobstructive native coronary disease. 2. LIMA to LAD is widely patent. 3. The mechanical valve demonstrates normal opening under fluoroscopy.    Recent Labs: No results found for requested labs within last 365 days.    Lipid Panel No results found for: CHOL, TRIG, HDL, CHOLHDL, VLDL, LDLCALC, LDLDIRECT   Wt Readings from Last 3 Encounters:  02/01/16 210 lb (95.255 kg)  01/30/16 213 lb 6.4 oz (96.798 kg)  01/19/16 213 lb 12.8 oz (96.979 kg)     Other studies Reviewed: Additional studies/ records that were reviewed today include: Records from [place]. Recent office notes and other records  ASSESSMENT AND PLAN:  1.  Atrial flutter: This is the first episode he's had in 3 years. Reviewed the options with him of ablation and antiarrhythmics. Advised that his blood pressure was too low at baseline for an increase in his metoprolol. I discussed the options of ablation versus antiarrhythmics. At this time, he prefers to wait and see if he has more symptoms as he is well aware that the symptoms may have been triggered by the excessive alcohol use. He will follow-up with Dr. Sw[place] to discuss further treatment options. For now, continue beta blocker at the current dosing contact us for symptoms. He was  advised that if he gets palpitations like that again, he needs to go to the hospital.  2. Feeling weak and difficulty getting a deep breath: I advised Frederick Payne that his blood pressure was 100 systolic. That may contribute to his being fatigued. However, the only medication that he is on which affects his blood pressure is the metoprolol. He is having enough PVCs that he does not wish to decrease that. He will contact us if he gets additional symptoms. Seasonal allergies may also be contributing to his shortness of breath, he is encouraged to continue using the medications he has or contact Dr. Susann Givens.  3. Depression: At the time of his last office visit, his depression was not well treated and he felt this was the reason he was overusing alcohol. Currently, he is asymptomatic and Dr. Susann Givens is managing it. As long as his depression is adequately treated, I am hopeful he will be able to avoid excessive alcohol.  4. Chest pain: His symptoms occurred in the setting of a heart rate of approximately 200 secondary to the atrial flutter. Once he was cardioverted, his chest pain resolved. A heart catheterization showed no obstructive disease. Results are above.   Current medicines are reviewed at length with the patient today.  The patient does not have concerns regarding medicines.  The following changes have been made:  no change  Labs/ tests ordered today include:  No orders of the defined types were placed in this encounter.     Disposition:   FU with Dr. [place]  Signed, Leanna Battles  02/01/2016 9:34 AM    [facility] Phone: [phone]; Fax: [phone]  This note was written with the assistance of speech recognition software. Please excuse any transcriptional errors.

## 2016-02-01 NOTE — Telephone Encounter (Signed)
Pt states that he was to get a referral to hematologist and pain clinic and he has not heard anything yet. Pt said he was told that the hematologist was supposed to call him for an appt but he is not sure about the pain clinic.

## 2016-02-01 NOTE — Telephone Encounter (Signed)
Left message for hematology referral coordinator to get appt scheduled for pt. [facility] is wanting previous pain records so pt is going to get that info and give it to [place] so we can fax it to them.

## 2016-02-02 ENCOUNTER — Encounter: Payer: Self-pay | Admitting: Hematology

## 2016-02-02 ENCOUNTER — Ambulatory Visit: Payer: Medicare Other | Admitting: Family Medicine

## 2016-02-02 ENCOUNTER — Telehealth: Payer: Self-pay | Admitting: Hematology

## 2016-02-02 ENCOUNTER — Encounter: Payer: Self-pay | Admitting: Family Medicine

## 2016-02-02 ENCOUNTER — Ambulatory Visit (INDEPENDENT_AMBULATORY_CARE_PROVIDER_SITE_OTHER): Payer: Medicare Other | Admitting: Family Medicine

## 2016-02-02 ENCOUNTER — Telehealth: Payer: Self-pay | Admitting: Internal Medicine

## 2016-02-02 VITALS — BP 112/70 | HR 65 | Ht 68.5 in | Wt 210.0 lb

## 2016-02-02 DIAGNOSIS — R58 Hemorrhage, not elsewhere classified: Secondary | ICD-10-CM

## 2016-02-02 DIAGNOSIS — Z7901 Long term (current) use of anticoagulants: Secondary | ICD-10-CM

## 2016-02-02 DIAGNOSIS — I259 Chronic ischemic heart disease, unspecified: Secondary | ICD-10-CM

## 2016-02-02 DIAGNOSIS — G894 Chronic pain syndrome: Secondary | ICD-10-CM

## 2016-02-02 LAB — PROTIME-INR
INR: 1.62 — ABNORMAL HIGH (ref <1.50)
Prothrombin Time: 19.4 seconds — ABNORMAL HIGH (ref 11.6–15.2)

## 2016-02-02 NOTE — Telephone Encounter (Signed)
Verified address and insurance, completed intake, in basket referring office of appt., pt confirmed appt. Mailed out new pt packet

## 2016-02-02 NOTE — Patient Instructions (Signed)
Taken half a cup of gr per day instead of the present one cup and recheck PT/INR on Tuesday.

## 2016-02-02 NOTE — Telephone Encounter (Signed)
Left message for patient to come back to the office at 2pm for an office visit.

## 2016-02-02 NOTE — Telephone Encounter (Signed)
Have him come back and see me today

## 2016-02-02 NOTE — Progress Notes (Signed)
Subjective:    Patient ID: Frederick Payne, male    DOB: 1961/06/09, 55 y.o.   MRN: [id number]  HPI He returns today for evaluation of right medial thigh pain. This started a few days ago. He did have a catheterization done 1 week ago. Apparently the did have some slight difficulty with some bleeding. He does have an underlying history of factor V Leiden presently is on Coumadin. He is also on Lovenox until we can get his PT/INR in a good range. He does not complain of lower extremity pain.   Review of Systems     Objective:   Physical Exam Exam of the right leg shows good distal pulse with normal appearance of the skin. Ecchymosis is noted in the right groin area. The femoral pulses difficult to feel but no pulsatile mass was noted.       Assessment & Plan:  Long term (current) use of anticoagulants  Chronic pain syndrome  Ecchymosis I Main concern was possible clotting or hematoma however he does have a good pulse and no evidence of hematoma in the inguinal area. Recommend supportive care. We also talked about his PT/INR. I will have him cut back to half cup of green's per day rather than increasing his Coumadin dosing is presently it is on 15 mg and in the past she was taking 12 mg. I think the dosing regimen is now mainly related to better eating habits.

## 2016-02-02 NOTE — Telephone Encounter (Signed)
Pt is here for pt/inr but had a concern as he has been having a pain in his groin area going down his right thigh. He had a heart cath done last week in hospital and he is brusied but wasn't sure if that could be causing pain. Please advise

## 2016-02-05 ENCOUNTER — Other Ambulatory Visit: Payer: Self-pay | Admitting: Family Medicine

## 2016-02-05 ENCOUNTER — Telehealth: Payer: Self-pay | Admitting: Family Medicine

## 2016-02-05 NOTE — Telephone Encounter (Signed)
Pt thought needed refill when actually Asmanex requires P.A.  Sample given per Verdene Lennert to hold pt.

## 2016-02-06 ENCOUNTER — Telehealth: Payer: Self-pay | Admitting: Internal Medicine

## 2016-02-06 ENCOUNTER — Ambulatory Visit: Payer: Medicare Other | Admitting: Family Medicine

## 2016-02-06 DIAGNOSIS — Z7901 Long term (current) use of anticoagulants: Secondary | ICD-10-CM

## 2016-02-06 DIAGNOSIS — G894 Chronic pain syndrome: Secondary | ICD-10-CM

## 2016-02-06 LAB — PROTIME-INR
INR: 2.01 — ABNORMAL HIGH (ref <1.50)
Prothrombin Time: 23.2 seconds — ABNORMAL HIGH (ref 11.6–15.2)

## 2016-02-06 NOTE — Telephone Encounter (Signed)
Patient does not want to go to a pain clinic for stimulator as he would like to go to a neurologist first for this. i have cancelled referral to pain clinic and referred to neuro. Pt is aware and they should call pt and schedule appt

## 2016-02-06 NOTE — Progress Notes (Signed)
Subjective:    Patient ID: Frederick Payne, male    DOB: 07-21-1961, 55 y.o.   MRN: [id number]  HPI    Review of Systems     Objective:   Physical Exam        Assessment & Plan:  PT/INR is not at goal but much better. I will have them again cut his screens in half and recheck in 1 week. I will hold off on any more Lovenox.

## 2016-02-12 NOTE — Telephone Encounter (Signed)
P.A. Darlin Priestly completed

## 2016-02-13 ENCOUNTER — Ambulatory Visit: Payer: Medicare Other

## 2016-02-13 DIAGNOSIS — Z7901 Long term (current) use of anticoagulants: Secondary | ICD-10-CM

## 2016-02-13 LAB — PROTIME-INR
INR: 3.66 — ABNORMAL HIGH (ref <1.50)
Prothrombin Time: 37.2 seconds — ABNORMAL HIGH (ref 11.6–15.2)

## 2016-02-13 NOTE — Telephone Encounter (Signed)
P.A. Isabelle Course pt needs trial of Flovent Diskus, Arnuity Ellipta and Flovent HFA. Do you want to switch?

## 2016-02-14 ENCOUNTER — Other Ambulatory Visit: Payer: Self-pay | Admitting: Hematology

## 2016-02-14 ENCOUNTER — Ambulatory Visit (HOSPITAL_BASED_OUTPATIENT_CLINIC_OR_DEPARTMENT_OTHER): Payer: Medicare Other

## 2016-02-14 ENCOUNTER — Encounter: Payer: Self-pay | Admitting: Hematology

## 2016-02-14 ENCOUNTER — Telehealth: Payer: Self-pay | Admitting: Hematology

## 2016-02-14 ENCOUNTER — Ambulatory Visit (HOSPITAL_BASED_OUTPATIENT_CLINIC_OR_DEPARTMENT_OTHER): Payer: Medicare Other | Admitting: Hematology

## 2016-02-14 VITALS — BP 124/77 | HR 56 | Temp 98.0°F | Resp 18 | Ht 68.5 in | Wt 206.9 lb

## 2016-02-14 DIAGNOSIS — E538 Deficiency of other specified B group vitamins: Secondary | ICD-10-CM | POA: Diagnosis not present

## 2016-02-14 DIAGNOSIS — D649 Anemia, unspecified: Secondary | ICD-10-CM | POA: Diagnosis not present

## 2016-02-14 DIAGNOSIS — E610 Copper deficiency: Secondary | ICD-10-CM

## 2016-02-14 DIAGNOSIS — Z952 Presence of prosthetic heart valve: Secondary | ICD-10-CM

## 2016-02-14 DIAGNOSIS — D509 Iron deficiency anemia, unspecified: Secondary | ICD-10-CM

## 2016-02-14 DIAGNOSIS — Z86711 Personal history of pulmonary embolism: Secondary | ICD-10-CM | POA: Diagnosis not present

## 2016-02-14 DIAGNOSIS — Z86718 Personal history of other venous thrombosis and embolism: Secondary | ICD-10-CM

## 2016-02-14 DIAGNOSIS — Z7901 Long term (current) use of anticoagulants: Secondary | ICD-10-CM

## 2016-02-14 DIAGNOSIS — D61818 Other pancytopenia: Secondary | ICD-10-CM

## 2016-02-14 LAB — COMPREHENSIVE METABOLIC PANEL
ALT: 81 U/L — ABNORMAL HIGH (ref 0–55)
AST: 55 U/L — ABNORMAL HIGH (ref 5–34)
Albumin: 4 g/dL (ref 3.5–5.0)
Alkaline Phosphatase: 60 U/L (ref 40–150)
Anion Gap: 10 mEq/L (ref 3–11)
BUN: 13.5 mg/dL (ref 7.0–26.0)
CO2: 26 mEq/L (ref 22–29)
Calcium: 10.1 mg/dL (ref 8.4–10.4)
Chloride: 107 mEq/L (ref 98–109)
Creatinine: 1 mg/dL (ref 0.7–1.3)
EGFR: 83 mL/min/{1.73_m2} — ABNORMAL LOW (ref >90)
Glucose: 102 mg/dl (ref 70–140)
Potassium: 4.3 mEq/L (ref 3.5–5.1)
Sodium: 143 mEq/L (ref 136–145)
Total Bilirubin: 0.49 mg/dL (ref 0.20–1.20)
Total Protein: 8 g/dL (ref 6.4–8.3)

## 2016-02-14 LAB — CBC & DIFF AND RETIC
BASO%: 0.5 % (ref 0.0–2.0)
Basophils Absolute: 0 10*3/uL (ref 0.0–0.1)
EOS%: 2.1 % (ref 0.0–7.0)
Eosinophils Absolute: 0.1 10*3/uL (ref 0.0–0.5)
HCT: 46.1 % (ref 38.4–49.9)
HGB: 15.4 g/dL (ref 13.0–17.1)
Immature Retic Fract: 6.8 % (ref 3.00–10.60)
LYMPH%: 21.8 % (ref 14.0–49.0)
MCH: 30.5 pg (ref 27.2–33.4)
MCHC: 33.4 g/dL (ref 32.0–36.0)
MCV: 91.3 fL (ref 79.3–98.0)
MONO#: 0.4 10*3/uL (ref 0.1–0.9)
MONO%: 9.7 % (ref 0.0–14.0)
NEUT#: 2.9 10*3/uL (ref 1.5–6.5)
NEUT%: 65.9 % (ref 39.0–75.0)
Platelets: 214 10*3/uL (ref 140–400)
RBC: 5.05 10*6/uL (ref 4.20–5.82)
RDW: 15.7 % — ABNORMAL HIGH (ref 11.0–14.6)
Retic %: 1.52 % (ref 0.80–1.80)
Retic Ct Abs: 76.76 10*3/uL (ref 34.80–93.90)
WBC: 4.3 10*3/uL (ref 4.0–10.3)
lymph#: 0.9 10*3/uL (ref 0.9–3.3)

## 2016-02-14 MED ORDER — COAGUCHEK LANCETS MISC: 1.0000 | Status: AC

## 2016-02-14 MED ORDER — COAGUCHEK XS PLUS SYSTEM KIT: 1.0000 | PACK | Status: AC

## 2016-02-14 MED ORDER — PT/INR TEST VI STRP: 1.0000 | ORAL_STRIP | Status: AC

## 2016-02-14 NOTE — Progress Notes (Signed)
[name]    HEMATOLOGY/ONCOLOGY CONSULTATION NOTE  Date of Service: 02/14/2016  Patient Care Team: Denita Lung, MD as PCP - General (Family Medicine)  CHIEF COMPLAINTS/PURPOSE OF CONSULTATION:  Iron deficiency, B12 deficiency, chronic anticoagulation - transfer of care.  HISTORY OF PRESENTING ILLNESS:   Frederick Payne is a wonderful 55 y.o. male who has been referred to [place] by Dr .Wyatt Haste, MD for evaluation and management of chronic iron deficiency, B12 deficiency, DVT with a history of ?? factor V Leiden mutation.  Patient has a history of hypertension, DVT in 2006 and pulmonary embolisms 2. Recurrent ED was in the setting of subtherapeutic INRs. He was also noted to have factor V Leiden mutation (we rechecked this and his FVL mutation was negative) .Patient notes that his INRs have been "all over the place" and he has needed FFP as intermittently to correct significantly elevated INR. he is hoping to get a device for home INR monitoring. He was being seen by Dr. Lanell Persons of hematology at [facility] [phone] ) and was being managed for iron deficiency related to ?slow GI bleeding versus poor absorption given his partial gastrectomy. He notes that he wants to transfer her care here since its closer to home. Patient has been on a multivitamin with vitamin K as per his hematologist to try to stabilize his INRs on Coumadin.  He notes that he has had a partial gastrectomy and vagotomy for bleeding gastric ulcer that was diagnosed in the distant past. Has had issues with iron deficiency requiring IV iron every 6 months and B12 deficiency requiring B12 replacement. He notes that he has had an EGD and colonoscopy last year - results not available to [place].  He has also had a metallic St. Jude aortic valve placement in 2003 for aortic stenosis and has atrial fibrillation/flutter and continues to be on therapeutic doses of Coumadin.  Patient reports no active bleeding at this time.  Extensive  review of his chart suggests he has had scattered care in multiple health systems including [place], [facility] and [facility].  Patient denies abusing alcohol at this time.  Has been on [place]. John's wort for several years. We discussed that this is known to have significant interaction with Coumadin.  MEDICAL HISTORY:  Past Medical History  Diagnosis Date  . Hypertension   . Psychiatric disorder     Underlying  . Deep vein thrombosis ([place]) 2006    After a broken ankle--recently diagnosed with reflex sympathetic dystrophy of the left lower extremity  . Aortic stenosis     Had surgery performed  . Ischemic heart disease   . CAD (coronary artery disease)   . Factor V Leiden ([place])   . Chronic anemia   . Reflex sympathetic dystrophy   . PUD (peptic ulcer disease)     with bleeding  . Elevated LFTs     h/o excessive alcohol   1. Iron deficiency anemia.  2. Leukopenia and thrombocytopenia since at least 09/2009. ?hypersplenism. Bone marrow Bx -offered -declined in the past 3. DVT in the LLE as well as PE in [id number] complicated by GI bleeding on anticoagulation with placement of an IVC filter.  4. Postphlebitic syndrome with chronic left lower extremity pain. venous duplex in 12/2010 showed chronic DVT with recanalization in the LLE and patent flow in the RLE with no evidence of acute DVT or SVT. He developed increased pain and swelling in the LLE in 05/2011 and had a left venous duplex showing an SVT  and the chronic DVT.   5. Alcohol abuse in the past now abstinent with history of alcoholic hepatitis. Sober since 04/2011 6. Hepatosplenomegaly.  7.another episode of GI bleeding in 09/2009 and was admitted at [facility]. He had a colonoscopy and EGD. The EGD showed changes of his partial gastrectomy but was otherwise normal. A tubular adenoma was removed during colonoscopy and internal hemorrhoids were noted. During that time he had an abdominal ultrasound showing hepatosplenomegaly. His LFTs  were also elevated; a viral hepatitis panel was nonreactive and it was felt this was alcoholic hepatitis.  8. mechanical aortic valve replacement with Dr. Norris Cross in [place], [place] on 08/13/12.  9. BCC removed from left flank 10. Copper deficiency  PRIOR TREATMENT:  1. Oral iron therapy started 07/2011.  2. Transfusion of 2 units PRBCs 09/27/11.  3. IV iron dextran 1700 mg on 11/14/11.  4. Oral copper supplementation.    SURGICAL HISTORY: Past Surgical History  Procedure Laterality Date  . Gallbladder surgery  2008  . Aortic valve replacement  07/15/12    27 mm St Jude  . Coronary artery bypass graft      x1  . Gastrectomy and vagotomy    . Greenfield filter    gastrectomy at age 70 for gastric bleeding ulcer with 75% of the stomach removed and the duodenum as well as vagotomy.   SOCIAL HISTORY: Social History   Social History  . Marital Status: Married    Spouse Name: N/A  . Number of Children: 2  . Years of Education: N/A   Occupational History  . finance-disabled    Social History Main Topics  . Smoking status: Former Research scientist (life sciences)  . Smokeless tobacco: Never Used     Comment: quit 15 + yrs ago  . Alcohol Use: No  . Drug Use: No  . Sexual Activity:    Partners: Male   Other Topics Concern  . Not on file   Social History Narrative    FAMILY HISTORY: Family History  Problem Relation Age of Onset  . Stroke Paternal Grandmother   . Healthy Father   No family history of clotting disorders   ALLERGIES:  is allergic to benzodiazepines; contrast media; iodine; morphine and related; other; pectin; shellfish allergy; sulfa antibiotics; tylenol; codeine; diazepam; hydrocodone-acetaminophen; and lactose intolerance (gi).  MEDICATIONS:  Current Outpatient Prescriptions  Medication Sig Dispense Refill  . ascorbic acid (VITAMIN C) 500 MG tablet Take 500 mg by mouth daily.    . ASMANEX 60 METERED DOSES 220 MCG/INH inhaler INHALE 2 PUFFS INTO THE LUNGS DAILY 1 Inhaler 1  .  aspirin 81 MG tablet Take 81 mg by mouth daily.    [name] CALCIUM PO Take 1 tablet by mouth daily.     . Copper Gluconate (COPPER CAPS) 2 MG CAPS Take by mouth every evening.    . enoxaparin (LOVENOX) 100 MG/ML injection Inject 100 mg into the skin.    . ferrous sulfate 325 (65 FE) MG tablet TAKE 1 TABLET BY MOUTH EVERY DAY 90 tablet 0  . fish oil-omega-3 fatty acids 1000 MG capsule Take 1 g by mouth daily.    [name] Lifitegrast (XIIDRA) 5 % SOLN     . metoprolol succinate (TOPROL-XL) 50 MG 24 hr tablet Take 50 mg by mouth daily. Take with or immediately following a meal.    . Multiple Vitamins-Minerals (ONE-A-DAY MENS 50+ ADVANTAGE PO) Take by mouth daily.    [name] POLICOSANOL PO Take 20 mg by mouth daily.    [name]  POLY-IRON 150 150 MG capsule TAKE 1 CAPSULE BY MOUTH EVERY DAY 90 capsule 3  . ST JOHNS WORT PO Take by mouth daily.    [name] warfarin (COUMADIN) 10 MG tablet TAKE 1 TABLET BY MOUTH EVERY DAY 90 tablet 0  . warfarin (COUMADIN) 2 MG tablet TAKE 1 TABLET BY MOUTH DAILY 30 tablet 2  . warfarin (COUMADIN) 5 MG tablet Take 5 mg by mouth daily.    [place] HFA 45 MCG/ACT inhaler INHALE 1-2 PUFFS INTO THE LUNGS EVERY 4-6 HOURS FOR WHEEZING OR SHORTNESS OF BREATH 15 g 0   No current facility-administered medications for this visit.    REVIEW OF SYSTEMS:    10 Point review of Systems was done is negative except as noted above.  PHYSICAL EXAMINATION: ECOG PERFORMANCE STATUS: 1 - Symptomatic but completely ambulatory  . Filed Vitals:   02/14/16 1350  BP: 124/77  Pulse: 56  Temp: 98 F (36.7 C)  Resp: 18   Filed Weights   02/14/16 1350  Weight: 206 lb 14.4 oz (93.849 kg)   .Body mass index is 31 kg/(m^2).  GENERAL:alert, in no acute distress and comfortable SKIN: skin color, texture, turgor are normal, no rashes or significant lesions EYES: normal, conjunctiva are pink and non-injected, sclera clear OROPHARYNX:no exudate, no erythema and lips, buccal mucosa, and tongue normal  NECK: supple,  no JVD, thyroid normal size, non-tender, without nodularity LYMPH:  no palpable lymphadenopathy in the cervical, axillary or inguinal LUNGS: clear to auscultation with normal respiratory effort HEART: regular rate & rhythm,  no murmurs and no lower extremity edema ABDOMEN: abdomen soft, non-tender, normoactive bowel sounds,  1 fingerbreadth hepatomegaly, 2 fingerbreadth splenomegaly Musculoskeletal: no cyanosis of digits and no clubbing  PSYCH: alert & oriented x 3 with fluent speech NEURO: no focal motor/sensory deficits  LABORATORY DATA:  I have reviewed the data as listed  . CBC Latest Ref Rng 02/14/2016 12/26/2014 12/12/2014  WBC 4.0 - 10.3 10e3/uL 4.3 5.7 3.2(L)  Hemoglobin 13.0 - 17.1 g/dL 15.4 16.4 15.4  Hematocrit 38.4 - 49.9 % 46.1 48.8 45.9  Platelets 140 - 400 10e3/uL 214 193.0 187    . CMP Latest Ref Rng 02/14/2016 12/26/2014 11/17/2014  Glucose 70 - 140 mg/dl 102 97 98  BUN 7.0 - 26.0 mg/dL 13.5 13 7   Creatinine 0.7 - 1.3 mg/dL 1.0 1.00 0.94  Sodium 136 - 145 mEq/L 143 138 140  Potassium 3.5 - 5.1 mEq/L 4.3 4.1 4.0  Chloride 96 - 112 mEq/L - 104 106  CO2 22 - 29 mEq/L 26 24 21   Calcium 8.4 - 10.4 mg/dL 10.1 10.2 8.8  Total Protein 6.4 - 8.3 g/dL 8.0 - 7.2  Total Bilirubin 0.20 - 1.20 mg/dL 0.49 - 0.4  Alkaline Phos 40 - 150 U/L 60 - 67  AST 5 - 34 U/L 55(H) - 58(H)  ALT 0 - 55 U/L 81(H) - 59(H)   Component     Latest Ref Rng 02/14/2016  Iron     42 - 163 ug/dL 146  TIBC     202 - 409 ug/dL 339  UIBC     117 - 376 ug/dL 192  %SAT     20 - 55 % 43  Ferritin     22 - 316 ng/ml 52  Vitamin B12     211 - 946 pg/mL >2000 (H)      RADIOGRAPHIC STUDIES: I have personally reviewed the radiological images as listed and agreed with the findings in the  report. No results found.  ASSESSMENT & PLAN:   Mr. Torrez is a 55 y.o. man with pancytopenia, history of DVT/PE, and recent aortic valve replacement seen today in follow up.   1. H/o Pancytopenia: Appears  intermittent; currently resolved. This has been to be related to hypersplenism previously. Ultrasound in 2015 showed a spleen which was about 16 cm in size. Also noted to have hepatomegaly likely with hepatic steatosis. Plan -Continue to monitor -Counseled on absolute alcohol avoidance.  2. Iron deficiency anemia: patient is not currently anemic. Iron deficiency has been thought to be related to his history of gastroduodenectomy for her gastric ulcer at age 29 years. This has led to iron deficiency, B12 deficiency and copper deficiency. Iron and B12 levels currently stable. Plan -No indication for IV iron at this time -Continue oral ferrous sulfate 1 tablet by mouth daily  -Continue sublingual B12 500 g sublingually daily. -On copper replacement 2 milligrams by mouth daily  -We'll recheck labs in 6 months .   3. DVT/PE: s/p IVC filter placement. He is now on indefinite anticoagulation because of his mechanical heart valve.  Some question FVL mutation though we checked this and history is negative for factor V Leiden mutation   4. Mechanical heart valve: St Jude aortic heart valve replaced in 2013.  Plan -Patient has significant variation in INR on Coumadin.  -We discussed all the potential drug interactions and he was provided a list of potential drug interactions. -Have recommended he talk to his primary care physician about getting off his [place] wort that can cause significant Coumadin interactions -Continue iron on monitoring with primary care physician. -He was given a prescription for coagucheck monitor and strips and lancets so he can monitor his INR more closely at home avoid any supratherapeutic levels that can significantly increase his risk of bleeding. -Patient is currently on a multivitamin with vitamin K. This could potentially be switched to something without vitamin K once INR is controlled  Return to care with Dr. Irene Limbo in 6 months with CBC, CMP, ferritin, B12 and  copper level   All of the patients questions were answered with apparent satisfaction. The patient knows to call the clinic with any problems, questions or concerns.  I spent 60 minutes counseling the patient face to face. The total time spent in the appointment was 60 minutes and more than 50% was on counseling and direct patient cares.    Sullivan Lone MD Marceline AAHIVMS [facility] Hematology/Oncology Physician [facility]  (Office):       [phone] (Work cell):  [phone] (Fax):           [phone]  02/14/2016 2:08 PM

## 2016-02-14 NOTE — Telephone Encounter (Signed)
Setup an appointment for me to see him when he comes in for his next blood draw for Coumadin

## 2016-02-14 NOTE — Telephone Encounter (Signed)
per po to sch pt appt-gave pt copy of avs °

## 2016-02-15 ENCOUNTER — Ambulatory Visit (INDEPENDENT_AMBULATORY_CARE_PROVIDER_SITE_OTHER): Payer: Medicare Other | Admitting: Family Medicine

## 2016-02-15 ENCOUNTER — Encounter: Payer: Self-pay | Admitting: Family Medicine

## 2016-02-15 VITALS — BP 120/70 | HR 60 | Wt 206.0 lb

## 2016-02-15 DIAGNOSIS — L02214 Cutaneous abscess of groin: Secondary | ICD-10-CM

## 2016-02-15 DIAGNOSIS — J4520 Mild intermittent asthma, uncomplicated: Secondary | ICD-10-CM

## 2016-02-15 DIAGNOSIS — I259 Chronic ischemic heart disease, unspecified: Secondary | ICD-10-CM

## 2016-02-15 DIAGNOSIS — Z7901 Long term (current) use of anticoagulants: Secondary | ICD-10-CM | POA: Diagnosis not present

## 2016-02-15 LAB — IRON AND TIBC
%SAT: 43 % (ref 20–55)
Iron: 146 ug/dL (ref 42–163)
TIBC: 339 ug/dL (ref 202–409)
UIBC: 192 ug/dL (ref 117–376)

## 2016-02-15 LAB — VITAMIN B12: Vitamin B12: >2000 pg/mL — ABNORMAL HIGH (ref 211–946)

## 2016-02-15 LAB — FERRITIN: Ferritin: 52 ng/ml (ref 22–316)

## 2016-02-15 MED ORDER — FLUTICASONE FUROATE 100 MCG/ACT IN AEPB: 1.0000 | INHALATION_SPRAY | Freq: Every day | RESPIRATORY_TRACT | Status: DC

## 2016-02-15 NOTE — Progress Notes (Signed)
Subjective:    Patient ID: Frederick Payne, male    DOB: 02-28-1961, 55 y.o.   MRN: [id number]  HPI He is here for evaluation of a slightly painful lesion in the right inguinal area that he states can do an head and then drained some fluid. He is having less pain in that area now. Also he was recently placed on Asmanex however since insurance will not cover this. He will need to be switched to a different medication. He also recently saw his hematologist and now has the ability to check his PT/INR at home.   Review of Systems     Objective:   Physical Exam Alert infant in no distress. 1 cm nontender firm lesion noted in the right inguinal area. No drainage is noted.       Assessment & Plan:  Long term (current) use of anticoagulants  Asthma with allergic rhinitis, mild intermittent, uncomplicated  Inguinal abscess short-term that the lesion in his inguinal area was nothing worry about however if it grows, he is to return for possible I+D. I will also switch him to [place]

## 2016-02-15 NOTE — Telephone Encounter (Signed)
Dr.Lalonde has talked to pt today

## 2016-02-19 LAB — FACTOR 5 LEIDEN

## 2016-02-26 ENCOUNTER — Encounter: Payer: Self-pay | Admitting: Neurology

## 2016-02-26 ENCOUNTER — Ambulatory Visit (INDEPENDENT_AMBULATORY_CARE_PROVIDER_SITE_OTHER): Payer: Medicare Other | Admitting: Neurology

## 2016-02-26 VITALS — BP 108/72 | HR 60 | Ht 68.5 in | Wt 209.0 lb

## 2016-02-26 DIAGNOSIS — G90522 Complex regional pain syndrome I of left lower limb: Secondary | ICD-10-CM | POA: Diagnosis not present

## 2016-02-26 DIAGNOSIS — I259 Chronic ischemic heart disease, unspecified: Secondary | ICD-10-CM

## 2016-02-26 DIAGNOSIS — D6851 Activated protein C resistance: Secondary | ICD-10-CM | POA: Diagnosis not present

## 2016-02-26 NOTE — Telephone Encounter (Signed)
Pt switched to [place]

## 2016-02-26 NOTE — Progress Notes (Signed)
Reason for visit: Left leg pain  Referring physician: Dr. Fredderick Severance is a 55 y.o. male  History of present illness:  Frederick Payne is a 55 year old right-handed white male with a history of a factor V Leiden mutation with an associated DVT in the left leg in 2006 associated with pulmonary emboli. Following this event, the patient developed a chronic pain syndrome felt secondary to a complex regional pain syndrome or RSD. The patient has had extreme sensitivity to light touch below the knee on the left leg, particularly around the ankle. The patient sleeps with his left leg out from underneath the covers. He has a burning and a stabbing type of pain that is present, and seems to be worse when his leg swells. The patient denies any severe back pain, he believes that there is some slight weakness of the left leg, he will stumble or fall on occasion. He denies any issues with the right leg. He has no neck pain or arm discomfort. In the past, he is has been followed through a pain center and he was placed on opioids which seemed to make the pain worse over time. He has been on gabapentin in the past without much benefit, he has not wanted to go on Lyrica. He indicates that antidepressant medications make him "crazy". He is essentially is on no medical therapy right now for the chronic pain. He is sent to this office or an evaluation.  Past Medical History  Diagnosis Date  . Hypertension   . Psychiatric disorder     Underlying  . Deep vein thrombosis ([place]) 2006    After a broken ankle--recently diagnosed with reflex sympathetic dystrophy of the left lower extremity  . Aortic stenosis     Had surgery performed  . Ischemic heart disease   . CAD (coronary artery disease)   . Factor V Leiden ([place])   . Chronic anemia   . Reflex sympathetic dystrophy   . PUD (peptic ulcer disease)     with bleeding  . Elevated LFTs     h/o excessive alcohol    Past Surgical History  Procedure  Laterality Date  . Gallbladder surgery  2008  . Aortic valve replacement  07/15/12    27 mm St Jude  . Coronary artery bypass graft      x1  . Gastrectomy and vagotomy    . Greenfield filter      Family History  Problem Relation Age of Onset  . Stroke Paternal Grandmother   . Healthy Father     Social history:  reports that he has quit smoking. He has never used smokeless tobacco. He reports that he does not drink alcohol or use illicit drugs.  Medications:  Prior to Admission medications   Medication Sig Start Date End Date Taking? Authorizing Provider  ACCU-CHEK AVIVA PLUS test strip USE AS DIRECTED 02/15/16  Yes Gautam Juleen China, MD  ascorbic acid (VITAMIN C) 500 MG tablet Take 500 mg by mouth daily.   Yes Historical Provider, MD  ASMANEX 60 METERED DOSES 220 MCG/INH inhaler INHALE 2 PUFFS INTO THE LUNGS DAILY 12/26/15  Yes Denita Lung, MD  aspirin 81 MG tablet Take 81 mg by mouth daily.   Yes Historical Provider, MD  CALCIUM PO Take 1 tablet by mouth daily.    Yes Historical Provider, MD  COAGUCHEK LANCETS MISC 1 each by Does not apply route as directed. 02/14/16  Yes Gautam Juleen China, MD  Copper Gluconate (  COPPER CAPS) 2 MG CAPS Take by mouth every evening.   Yes Historical Provider, MD  ferrous sulfate 325 (65 FE) MG tablet TAKE 1 TABLET BY MOUTH EVERY DAY 02/05/16  Yes Denita Lung, MD  fish oil-omega-3 fatty acids 1000 MG capsule Take 1 g by mouth daily.   Yes Historical Provider, MD  Fluticasone Furoate (ARNUITY ELLIPTA) 100 MCG/ACT AEPB Inhale 1 puff into the lungs daily. 02/15/16  Yes Denita Lung, MD  Lifitegrast Shirley Friar) 5 % SOLN  11/07/15  Yes Historical Provider, MD  metoprolol succinate (TOPROL-XL) 50 MG 24 hr tablet Take 50 mg by mouth daily. Take with or immediately following a meal.   Yes Historical Provider, MD  Multiple Vitamins-Minerals (ONE-A-DAY MENS 50+ ADVANTAGE PO) Take by mouth daily.   Yes Historical Provider, MD  POLICOSANOL PO Take 20 mg by mouth  daily.   Yes Historical Provider, MD  POLY-IRON 150 150 MG capsule TAKE 1 CAPSULE BY MOUTH EVERY DAY 01/15/16  Yes Denita Lung, MD  PT/INR Test [facility] PT TEST) STRP 1 each by In Vitro route as directed. 02/14/16  Yes Brunetta Genera, MD  PT/INR Testing Monitor (COAGUCHEK XS PLUS SYSTEM) KIT 1 each by Does not apply route as directed. 02/14/16  Yes Brunetta Genera, MD  warfarin (COUMADIN) 10 MG tablet TAKE 1 TABLET BY MOUTH EVERY DAY 12/21/15  Yes Denita Lung, MD  warfarin (COUMADIN) 2 MG tablet TAKE 1 TABLET BY MOUTH DAILY 01/08/16  Yes Denita Lung, MD  warfarin (COUMADIN) 5 MG tablet Take 5 mg by mouth daily.   Yes Historical Provider, MD  XOPENEX HFA 45 MCG/ACT inhaler INHALE 1-2 PUFFS INTO THE LUNGS EVERY 4-6 HOURS FOR WHEEZING OR SHORTNESS OF BREATH 02/05/16  Yes Denita Lung, MD      Allergies  Allergen Reactions  . Benzodiazepines Other (See Comments)    Causes pt to get violent   . Contrast Media [Iodinated Diagnostic Agents] Hives  . Iodine Anaphylaxis  . Morphine And Related Other (See Comments)    Said not allergic to Morphine.  Said that he was so that no one would prescribe it to him.  . Other Anaphylaxis    FRUITS FROM TREES.   . Pectin Anaphylaxis  . Shellfish Allergy Anaphylaxis  . Sulfa Antibiotics Anaphylaxis  . Tylenol [Acetaminophen] Nausea And Vomiting    headache  . Codeine Other (See Comments)  . Diazepam Other (See Comments)    Headache, strange behavior, memory loss  . Hydrocodone-Acetaminophen Other (See Comments)    Stomach pains , caused by the tylenol part  . Lactose Intolerance (Gi) Nausea And Vomiting    ROS:  Out of a complete 14 system review of symptoms, the patient complains only of the following symptoms, and all other reviewed systems are negative.  Palpitations of the heart, swelling in the legs Ringing in the ears Shortness of breath, snoring Anemia, easy bruising, easy bleeding Joint pain, achy  muscles Allergies Depression, anxiety Restless legs  Blood pressure 108/72, pulse 60, height 5' 8.5" (1.74 m), weight 209 lb (94.802 kg).  Physical Exam  General: The patient is alert and cooperative at the time of the examination.  Eyes: Pupils are equal, round, and reactive to light. Discs are flat bilaterally.  Neck: The neck is supple, no carotid bruits are noted.  Respiratory: The respiratory examination is clear.  Cardiovascular: The cardiovascular examination reveals a regular rate and rhythm, no obvious murmurs or rubs are noted.  Neuromuscular:  Range of movement of the lumbar spine is full.  Skin: Extremities are without significant edema on the right leg, 1+ edema on the left leg.  Neurologic Exam  Mental status: The patient is alert and oriented x 3 at the time of the examination. The patient has apparent normal recent and remote memory, with an apparently normal attention span and concentration ability.  Cranial nerves: Facial symmetry is present. There is good sensation of the face to pinprick and soft touch bilaterally. The strength of the facial muscles and the muscles to head turning and shoulder shrug are normal bilaterally. Speech is well enunciated, no aphasia or dysarthria is noted. Extraocular movements are full. Visual fields are full. The tongue is midline, and the patient has symmetric elevation of the soft palate. No obvious hearing deficits are noted.  Motor: The motor testing reveals 5 over 5 strength of all 4 extremities. Good symmetric motor tone is noted throughout.  Sensory: Sensory testing is intact to pinprick, soft touch, vibration sensation, and position sense on all 4 extremities, with exception that there is hypersensitivity below the knee with light touch and pinprick sensation. No evidence of extinction is noted.  Coordination: Cerebellar testing reveals good finger-nose-finger and heel-to-shin bilaterally.  Gait and station: Gait is normal.  Tandem gait is normal. Romberg is negative. No drift is seen. The patient is able to walk on heels and the toes bilaterally.  Reflexes: Deep tendon reflexes are symmetric and normal bilaterally, with exception of some slight decrease in ankle jerk reflexes bilaterally. Toes are downgoing bilaterally.   Assessment/Plan:  1. Left leg pain, complex regional pain syndrome  2. Factor V Leiden mutation, coagulopathy  The patient has multiple medical issues including a factor V Leiden mutation, atrial fibrillation, aortic valve replacement, history of alcohol abuse, and chronic pain. The patient does not wish to go on any medications for his pain, we will try a TENS unit at this time, if this is not effective, then possibly a spinal stimulator may be useful. The patient will follow-up in 4 or 5 months.  Frederick Alexanders MD 02/26/2016 7:27 PM  [facility] [address] [place], [place] [address]  Phone [phone] Fax [phone]

## 2016-02-27 ENCOUNTER — Telehealth: Payer: Self-pay | Admitting: Neurology

## 2016-02-27 DIAGNOSIS — G90522 Complex regional pain syndrome I of left lower limb: Secondary | ICD-10-CM

## 2016-02-27 NOTE — Telephone Encounter (Signed)
New ordered entered for Amb Ref to PT for TENS unit eval and dispense. Pt notified via TC.

## 2016-02-27 NOTE — Telephone Encounter (Signed)
Pt called sts yesterday Dr Jannifer Franklin had discussed pt getting tens unit. He said today he rec'd a call from [facility]hab and they do not supply tens unit. Pt is confused.

## 2016-02-29 ENCOUNTER — Other Ambulatory Visit: Payer: Self-pay

## 2016-03-06 DIAGNOSIS — F4310 Post-traumatic stress disorder, unspecified: Secondary | ICD-10-CM | POA: Diagnosis not present

## 2016-03-07 ENCOUNTER — Encounter: Payer: Self-pay | Admitting: Physical Therapy

## 2016-03-07 ENCOUNTER — Ambulatory Visit (INDEPENDENT_AMBULATORY_CARE_PROVIDER_SITE_OTHER): Payer: Medicare Other | Admitting: Physical Therapy

## 2016-03-07 DIAGNOSIS — M79605 Pain in left leg: Secondary | ICD-10-CM

## 2016-03-07 NOTE — Patient Instructions (Signed)
Home TENs unit usage, care and electrode management,

## 2016-03-07 NOTE — Therapy (Signed)
[facility] [address] [place], [place], [address] Phone: [phone]   Fax:  [phone]  Physical Therapy Evaluation  Patient Details  Name: Frederick Payne MRN: [id number] Date of Birth: 21-Nov-1960 Referring Provider: Dr Anne Hahn  Encounter Date: 03/07/2016      PT End of Session - 03/07/16 1059    Visit Number 1   Number of Visits 1   PT Start Time 1020   PT Stop Time 1058   PT Time Calculation (min) 38 min   Activity Tolerance Patient tolerated treatment well      Past Medical History  Diagnosis Date  . Hypertension   . Psychiatric disorder     Underlying  . Deep vein thrombosis (HCC) 2006    After a broken ankle--recently diagnosed with reflex sympathetic dystrophy of the left lower extremity  . Aortic stenosis     Had surgery performed  . Ischemic heart disease   . CAD (coronary artery disease)   . Factor V Leiden (HCC)   . Chronic anemia   . Reflex sympathetic dystrophy   . PUD (peptic ulcer disease)     with bleeding  . Elevated LFTs     h/o excessive alcohol    Past Surgical History  Procedure Laterality Date  . Gallbladder surgery  2008  . Aortic valve replacement  07/15/12    27 mm St Jude  . Coronary artery bypass graft      x1  . Gastrectomy and vagotomy    . Greenfield filter      There were no vitals filed for this visit.       Subjective Assessment - 03/07/16 1020    Subjective Pt  had 3 PE's in 2006 and he has had chronic Lt LE pain since,  he has tried working with a pain clinic and he doesn't like to be on medication so he stopped 2 ys ago.    Currently in Pain? Yes   Pain Score 5   usually pushes 9/10    Pain Location Leg   Pain Orientation Left   Pain Descriptors / Indicators Burning   Pain Type Chronic pain   Pain Radiating Towards mid thigh to toes   Pain Onset Other (comment)            [facility] PT Assessment - 03/07/16 0001    Assessment   Medical Diagnosis Lt  LE RSD   Referring Provider Dr Anne Hahn   Onset Date/Surgical Date 03/07/05   Precautions   Precautions --  activity as tolerated   Balance Screen   Has the patient fallen in the past 6 months Yes   How many times? 1  passed out   Has the patient had a decrease in activity level because of a fear of falling?  No   Is the patient reluctant to leave their home because of a fear of falling?  No   Prior Function   Level of Independence Independent                   [facility] Adult PT Treatment/Exercise - 03/07/16 0001    Self-Care   Self-Care Other Self-Care Comments  home TENs unit   Modalities   Modalities Insurance account manager Location lumbar   Electrical Stimulation Action all modes on home unit   Electrical Stimulation Parameters to tolerance   Electrical Stimulation Goals Pain  PT Education - 03/07/16 1101    Education provided Yes   Education Details home TENS   Person(s) Educated Patient   Methods Explanation;Demonstration;Handout   Comprehension Returned demonstration;Verbalized understanding             PT Long Term Goals - 03/07/16 1102    PT LONG TERM GOAL #1   Title I with home TENs unit   Status Achieved  at eval               Plan - 03/07/16 1101    Clinical Impression Statement This was a one time visit for Micheal to be instructed in the usage of his home TENs unit.  He has chronic pain in his Lt LE from RSD and doesn't want to use medication.    Rehab Potential Excellent  for goals set   PT Frequency One time visit   PT Next Visit Plan D/C, one time visit for instruction      Patient will benefit from skilled therapeutic intervention in order to improve the following deficits and impairments:  Pain  Visit Diagnosis: Pain In Left Leg     Problem List Patient Active Problem List   Diagnosis Date Noted  . Pancytopenia (HCC) 02/20/2016  . Iron  deficiency anemia 02/20/2016  . B12 deficiency 02/14/2016  . History of alcohol abuse 09/19/2015  . Asthma with allergic rhinitis 02/08/2014  . Allergic rhinitis 02/08/2014  . Anemia 02/08/2014  . Factor V Leiden (HCC) 02/08/2014  . RSD lower limb 02/08/2014  . History of pulmonary embolus (PE) 02/08/2014  . S/P AVR 08/17/2012  . S/P CABG (coronary artery bypass graft) 08/17/2012    Roderic Scarce PT 03/07/2016, 11:05 AM  [facility] [address] [place], [place], [address] Phone: [phone]   Fax:  [phone]  Name: Frederick Payne MRN: [id number] Date of Birth: 02-19-1961

## 2016-03-13 ENCOUNTER — Telehealth: Payer: Self-pay

## 2016-03-13 NOTE — Telephone Encounter (Signed)
Pt called to let us know that his INR yesterday was 4.4. Lalone pt. Please advise. Victorino December

## 2016-03-13 NOTE — Telephone Encounter (Signed)
Spoke with pt- he states that request was originally for pain management, but he sees neurology now. Please recommend if records are still needed.  Patient reports INR last night was 4.4 on home monitor.

## 2016-03-13 NOTE — Telephone Encounter (Signed)
He normally sees Dr. Redmond School.  He has been coming here for lab.  Where is the lab result?  Where was it done? Who ordered it as it doesn't appear we did?    FYI - if another doctor ordered it, they should respond to that result.  Otherwise that is why we have coag visit and charge for this.  Get more info please.

## 2016-03-13 NOTE — Telephone Encounter (Signed)
LM for Mr. Frederick Payne to CB in regards to name needs for doctor on medical release. Frederick Payne

## 2016-03-14 NOTE — Telephone Encounter (Signed)
See what is going on with his Frederick Payne

## 2016-03-14 NOTE — Telephone Encounter (Signed)
LM for pt to CB. Need to verify dosing and how much greens he is eating? Victorino December

## 2016-03-14 NOTE — Telephone Encounter (Signed)
Double check his dosing and forward to Dr. Redmond School.

## 2016-03-14 NOTE — Telephone Encounter (Signed)
Coumadin 12mg  daily. He is eating greens on a regular basis, but reports he was at the beach last week and did not eat greens as much. He said he skipped his coumadin last night and will recheck it tonight at home with home monitor.

## 2016-03-14 NOTE — Telephone Encounter (Signed)
Pt checks at home with his own machine but this should have been sent to Dr. Redmond School so I attached him to this message

## 2016-03-20 ENCOUNTER — Other Ambulatory Visit: Payer: Self-pay | Admitting: Family Medicine

## 2016-03-20 DIAGNOSIS — F4310 Post-traumatic stress disorder, unspecified: Secondary | ICD-10-CM | POA: Diagnosis not present

## 2016-03-20 NOTE — Telephone Encounter (Signed)
Is this okay to refill? 

## 2016-04-01 DIAGNOSIS — F4310 Post-traumatic stress disorder, unspecified: Secondary | ICD-10-CM | POA: Diagnosis not present

## 2016-04-04 ENCOUNTER — Ambulatory Visit: Payer: Medicare Other | Admitting: Cardiology

## 2016-04-05 ENCOUNTER — Encounter: Payer: Self-pay | Admitting: *Deleted

## 2016-04-25 DIAGNOSIS — F4310 Post-traumatic stress disorder, unspecified: Secondary | ICD-10-CM | POA: Diagnosis not present

## 2016-04-26 ENCOUNTER — Ambulatory Visit: Payer: Medicare Other | Admitting: Cardiology

## 2016-04-29 ENCOUNTER — Telehealth: Payer: Self-pay | Admitting: Family Medicine

## 2016-04-29 ENCOUNTER — Ambulatory Visit (HOSPITAL_COMMUNITY)
Admission: RE | Admit: 2016-04-29 | Discharge: 2016-04-29 | Disposition: A | Discharge status: Home / Self Care | Payer: Medicare Other | Source: Ambulatory Visit | Attending: Nurse Practitioner | Admitting: Nurse Practitioner

## 2016-04-29 ENCOUNTER — Telehealth: Payer: Self-pay | Admitting: Cardiology

## 2016-04-29 ENCOUNTER — Encounter (HOSPITAL_COMMUNITY): Payer: Self-pay | Admitting: Nurse Practitioner

## 2016-04-29 VITALS — BP 122/74 | HR 60 | Ht 68.5 in | Wt 210.6 lb

## 2016-04-29 DIAGNOSIS — D6851 Activated protein C resistance: Secondary | ICD-10-CM | POA: Insufficient documentation

## 2016-04-29 DIAGNOSIS — I10 Essential (primary) hypertension: Secondary | ICD-10-CM | POA: Insufficient documentation

## 2016-04-29 DIAGNOSIS — Z7982 Long term (current) use of aspirin: Secondary | ICD-10-CM | POA: Diagnosis not present

## 2016-04-29 DIAGNOSIS — Z87891 Personal history of nicotine dependence: Secondary | ICD-10-CM | POA: Diagnosis not present

## 2016-04-29 DIAGNOSIS — Z951 Presence of aortocoronary bypass graft: Secondary | ICD-10-CM | POA: Insufficient documentation

## 2016-04-29 DIAGNOSIS — I2510 Atherosclerotic heart disease of native coronary artery without angina pectoris: Secondary | ICD-10-CM | POA: Diagnosis not present

## 2016-04-29 DIAGNOSIS — Z882 Allergy status to sulfonamides status: Secondary | ICD-10-CM | POA: Diagnosis not present

## 2016-04-29 DIAGNOSIS — Z888 Allergy status to other drugs, medicaments and biological substances status: Secondary | ICD-10-CM | POA: Insufficient documentation

## 2016-04-29 DIAGNOSIS — R002 Palpitations: Secondary | ICD-10-CM

## 2016-04-29 DIAGNOSIS — Z8711 Personal history of peptic ulcer disease: Secondary | ICD-10-CM | POA: Diagnosis not present

## 2016-04-29 DIAGNOSIS — Z91041 Radiographic dye allergy status: Secondary | ICD-10-CM | POA: Insufficient documentation

## 2016-04-29 DIAGNOSIS — Z885 Allergy status to narcotic agent status: Secondary | ICD-10-CM | POA: Insufficient documentation

## 2016-04-29 DIAGNOSIS — Z86718 Personal history of other venous thrombosis and embolism: Secondary | ICD-10-CM | POA: Diagnosis not present

## 2016-04-29 DIAGNOSIS — Z952 Presence of prosthetic heart valve: Secondary | ICD-10-CM | POA: Diagnosis not present

## 2016-04-29 DIAGNOSIS — Z7901 Long term (current) use of anticoagulants: Secondary | ICD-10-CM | POA: Insufficient documentation

## 2016-04-29 DIAGNOSIS — I4891 Unspecified atrial fibrillation: Secondary | ICD-10-CM | POA: Diagnosis not present

## 2016-04-29 DIAGNOSIS — Z79899 Other long term (current) drug therapy: Secondary | ICD-10-CM | POA: Diagnosis not present

## 2016-04-29 MED ORDER — METOPROLOL SUCCINATE ER 50 MG PO TB24: ORAL_TABLET | ORAL | Status: DC

## 2016-04-29 NOTE — Telephone Encounter (Signed)
Dr.Jordan nurse is calling pt to get more info for why pt needs to be seen since he did not give much information

## 2016-04-29 NOTE — Telephone Encounter (Signed)
New message   MD office calling stating patients need to be seen sooner with Dr. Martinique -   Explain to office of r/s appt from 6.30.2017 move up to  6.8.2017

## 2016-04-29 NOTE — Telephone Encounter (Signed)
I have called over to [facility] pt had an appointment on June 30 th called and had appointment moved sooner to 04/04/16 pt no showed to that appointment he has been seen [facility] and only [facility] so I don't understand why he went to the wrong place but they are sending a note to [facility] nurse to see when they can get him in

## 2016-04-29 NOTE — Patient Instructions (Signed)
Your physician has recommended you make the following change in your medication:  1)Metoprolol -- take 25mg  in the morning (1/2 tablet) and take 50mg  in the evening

## 2016-04-29 NOTE — Telephone Encounter (Signed)
We can work with a patient who has a home monitor, but we do not have a protocol for patient.  He would have to call each result in to the office, follow our directions as to dose and next check date.

## 2016-04-29 NOTE — Telephone Encounter (Signed)
Pt concerned about heart and think he may need a referral to another cardiologist that can see him sooner since his heart has been giving him issues.   Pt said he had to wait 2 months to see cardiologist at [facility] on [facility] then he showed up for his appt at one location to find out that he was at the wrong location & too late for him to get to the correct location for his appt so he had to resched. Pt was upset that he was not told the correct place to go  For his appt then had to resched and in the meantime his heart is not behaving correctly.[name]

## 2016-04-29 NOTE — Telephone Encounter (Signed)
Returned call to Dr. Lanice Shirts office. They report notes as reviewed - patient had called in w concern to be seen for some heart issues. Note he was admitted at the end of March in [facility] facility for rapid a flutter - was cardioverted. Previous hx of CABG w/LIMA-LAD and mechanical aortic valve. -------- Called patient. Pt initially frustrated bc he had a missed appt w/ Dr. Martinique in June (had previously seen Dr. Martinique at [facility] and showed for appt there last time, instead of [facility]). Inquired about issues patient has been having.  He notes an uptick in palpitations, notes his metoprolol succ 100mg  daily was reduced to 50mg  at last visit w/ Suanne Marker in April. He feels like he is in atrial flutter. Notes no rapid rate, HR has been up and down a bit but he has not been overtly fatigued w/ this or short of breath. He notes no chest pain, lightheadedness, or other symptoms.  Atrial flutter has been persistent for 1-2 weeks. Symptom of palpitations-- "most of the time it's just fluttering or skipping". He notes used to happen 1-2 times in an hour, now every 30 seconds.  Arranged appt for patient to be seen in A Fib clinic - we discussed benefits of this such as optimization of drug therapy, referral to EP, etc. Pt agreeable to this and voiced understanding of appt instructions. Has been given gate code and clinic phone number.  -------- Patient is concerned for throwing a clot - notes most recent INR was subtherapeutic. Dr. Lanice Shirts office manages, but patient has a home coumadin meter and dose self-dosing based on a protocol prearranged by PCP. Patient and I discussed this and he would like to discuss option of transferring his coumadin monitoring to our office. Will include pharmD on message routing.

## 2016-04-29 NOTE — Progress Notes (Signed)
Patient ID: Frederick Payne, male   DOB: 11-01-1960, 55 y.o.   MRN: [id number]     Primary Care Physician: Frederick Haste, MD Referring Physician: Mechele Payne line tirage   Frederick Payne is a 55 y.o. male with a h/o of Leiden 5 deficiency and has had problems with hypercoagulability in the past. He is on long-term Coumadin managed by Frederick Payne. He has had a previous blood clot to his left leg which caused him to have an ischemic neuropathy and he has leg weakness and walks with a limp. He states that he has had 3 separate episodes of pulmonary emboli. He has a inferior vena cava filter in place now. Also has a history of ischemic heart disease and valvular heart disease. In July 2014 he underwent aortic valve replacement with a St. Jude mechanical valve. He also had single-vessel coronary artery bypass graft surgery. He had post op afib that resolved.  He is being seen in afib clinic for palpitations that he has noted over the last few weeks. States that he used to be on 100 mg metoprolol and he did not notice them as much then. He states that he was admitted for  to [facility] in April for typical a flutter for which he was cardioverted x 1. No meds were changed. He has not had any further sustained tachycardia but what he describes,sounds like premature contractions. He has had labile INR's in the past as recently as 10 in April but this was contributed to by his alcohol use. He currently denies any alcohol use. His INR was 1.8  in  the last few days. EKG shows SR, no PC's.  Today, he denies symptoms of  chest pain, shortness of breath, orthopnea, PND, lower extremity edema, dizziness, presyncope, syncope, or neurologic sequela.Positive for palpitations. The patient is tolerating medications without difficulties and is otherwise without complaint today.   Past Medical History  Diagnosis Date  . Hypertension   . Psychiatric disorder     Underlying  . Deep vein thrombosis ([place]) 2006      After a broken ankle--recently diagnosed with reflex sympathetic dystrophy of the left lower extremity  . Aortic stenosis     Had surgery performed  . Ischemic heart disease   . CAD (coronary artery disease)   . Factor V Leiden ([place])   . Chronic anemia   . Reflex sympathetic dystrophy   . PUD (peptic ulcer disease)     with bleeding  . Elevated LFTs     h/o excessive alcohol   Past Surgical History  Procedure Laterality Date  . Gallbladder surgery  2008  . Aortic valve replacement  07/15/12    27 mm St Jude  . Coronary artery bypass graft      x1  . Gastrectomy and vagotomy    . Greenfield filter      Current Outpatient Prescriptions  Medication Sig Dispense Refill  . ACCU-CHEK AVIVA PLUS test strip USE AS DIRECTED 150 each 2  . ascorbic acid (VITAMIN C) 500 MG tablet Take 500 mg by mouth daily.    . ASMANEX 60 METERED DOSES 220 MCG/INH inhaler INHALE 2 PUFFS INTO THE LUNGS DAILY 1 Inhaler 1  . aspirin 81 MG tablet Take 81 mg by mouth daily.    [name] CALCIUM PO Take 1 tablet by mouth daily.     Sabino Dick LANCETS MISC 1 each by Does not apply route as directed. 200 each 1  . Copper Gluconate (COPPER CAPS) 2 MG CAPS  Take by mouth every evening.    . ferrous sulfate 325 (65 FE) MG tablet TAKE 1 TABLET BY MOUTH EVERY DAY 90 tablet 0  . fish oil-omega-3 fatty acids 1000 MG capsule Take 1 g by mouth daily.    . Fluticasone Furoate (ARNUITY ELLIPTA) 100 MCG/ACT AEPB Inhale 1 puff into the lungs daily. 30 each 11  . Lifitegrast (XIIDRA) 5 % SOLN     . metoprolol succinate (TOPROL-XL) 50 MG 24 hr tablet Take 50 mg by mouth daily. Take with or immediately following a meal.    . Multiple Vitamins-Minerals (ONE-A-DAY MENS 50+ ADVANTAGE PO) Take by mouth daily.    [name] POLICOSANOL PO Take 20 mg by mouth daily.    [name] POLY-IRON 150 150 MG capsule TAKE 1 CAPSULE BY MOUTH EVERY DAY 90 capsule 3  . PT/INR Test (COAGUCHEK PT TEST) STRP 1 each by In Vitro route as directed. 48 each 1  . PT/INR  Testing Monitor (COAGUCHEK XS PLUS SYSTEM) KIT 1 each by Does not apply route as directed. 1 kit 0  . warfarin (COUMADIN) 10 MG tablet TAKE 1 TABLET BY MOUTH EVERY DAY 90 tablet 0  . warfarin (COUMADIN) 2 MG tablet TAKE 1 TABLET BY MOUTH DAILY 30 tablet 2  . warfarin (COUMADIN) 5 MG tablet Take 5 mg by mouth daily.    Pauline Aus HFA 45 MCG/ACT inhaler INHALE 1-2 PUFFS INTO THE LUNGS EVERY 4-6 HOURS FOR WHEEZING OR SHORTNESS OF BREATH 15 g 0   No current facility-administered medications for this encounter.    Allergies  Allergen Reactions  . Benzodiazepines Other (See Comments)    Causes pt to get violent   . Contrast Media [Iodinated Diagnostic Agents] Hives  . Iodine Anaphylaxis  . Morphine And Related Other (See Comments)    Said not allergic to Morphine.  Said that he was so that no one would prescribe it to him.  . Other Anaphylaxis    FRUITS FROM TREES.   . Pectin Anaphylaxis  . Shellfish Allergy Anaphylaxis  . Sulfa Antibiotics Anaphylaxis  . Tylenol [Acetaminophen] Nausea And Vomiting    headache  . Codeine Other (See Comments)  . Diazepam Other (See Comments)    Headache, strange behavior, memory loss  . Hydrocodone-Acetaminophen Other (See Comments)    Stomach pains , caused by the tylenol part  . Lactose Intolerance (Gi) Nausea And Vomiting    Social History   Social History  . Marital Status: Married    Spouse Name: N/A  . Number of Children: 2  . Years of Education: N/A   Occupational History  . finance-disabled    Social History Main Topics  . Smoking status: Former Games developer  . Smokeless tobacco: Never Used     Comment: quit 15 + yrs ago  . Alcohol Use: No  . Drug Use: No  . Sexual Activity:    Partners: Male   Other Topics Concern  . Not on file   Social History Narrative   Lives at home w/ his partner, Susann Givens and his elderly mother   Right-handed   Drinks about 3 cups of tea per day    Family History  Problem Relation Age of Onset  .  Stroke Paternal Grandmother   . Healthy Father     ROS- All systems are reviewed and negative except as per the HPI above  Physical Exam: There were no vitals filed for this visit.  GEN- The patient is well appearing, alert and oriented x 3  today.   Head- normocephalic, atraumatic Eyes-  Sclera clear, conjunctiva pink Ears- hearing intact Oropharynx- clear Neck- supple, no JVP Lymph- no cervical lymphadenopathy Lungs- Clear to ausculation bilaterally, normal work of breathing Heart- Regular rate and rhythm, no murmurs, rubs or gallops, PMI not laterally displaced GI- soft, NT, ND, + BS Extremities- no clubbing, cyanosis, or edema MS- no significant deformity or atrophy Skin- no rash or lesion Psych- euthymic mood, full affect Neuro- strength and sensation are intact  EKG-NSR with LVH at 60 bpm  Echo- 11/13/15 LV EF: 55% - 60%  ------------------------------------------------------------------- Indications: PVC&'s (I49.3).  ------------------------------------------------------------------- History: PMH: AVR.  ------------------------------------------------------------------- Study Conclusions  - Left ventricle: The cavity size was normal. Wall thickness was  normal. Systolic function was normal. The estimated ejection  fraction was in the range of 55% to 60%. - Aortic valve: Normal appearing mechanical AVR with stable trivial  peri valvular regurgitation. Valve area (VTI): 1.74 cm^2. Valve  area (Vmax): 1.8 cm^2. Valve area (Vmean): 1.71 cm^2. - Left atrium: The atrium was mildly dilated. - Atrial septum: No defect or patent foramen ovale was identified.  Last INR per pt 1.8   Assessment and Plan: 1. H/o of afib at time of cardiac surgery,resolved, and a flutter in April,resolved Currently his symptoms sound like palpitations Will place a 48 hour monitor Increase metoprolol to 25 am and will continue with his usual 50 mg pm Continue warfarin per  PCP  F/u in 3 weeks   Lupita Leash C. Matthew Fol[facility] [address] [place], [place] [address] [phone]

## 2016-05-02 ENCOUNTER — Ambulatory Visit (INDEPENDENT_AMBULATORY_CARE_PROVIDER_SITE_OTHER): Payer: Medicare Other

## 2016-05-02 DIAGNOSIS — R002 Palpitations: Secondary | ICD-10-CM

## 2016-05-06 DIAGNOSIS — F4310 Post-traumatic stress disorder, unspecified: Secondary | ICD-10-CM | POA: Diagnosis not present

## 2016-05-14 DIAGNOSIS — H04123 Dry eye syndrome of bilateral lacrimal glands: Secondary | ICD-10-CM | POA: Diagnosis not present

## 2016-05-14 DIAGNOSIS — H2513 Age-related nuclear cataract, bilateral: Secondary | ICD-10-CM | POA: Diagnosis not present

## 2016-05-14 DIAGNOSIS — H16223 Keratoconjunctivitis sicca, not specified as Sjogren's, bilateral: Secondary | ICD-10-CM | POA: Diagnosis not present

## 2016-05-17 ENCOUNTER — Ambulatory Visit (INDEPENDENT_AMBULATORY_CARE_PROVIDER_SITE_OTHER): Payer: Medicare Other

## 2016-05-17 ENCOUNTER — Telehealth: Payer: Self-pay | Admitting: Family Medicine

## 2016-05-17 DIAGNOSIS — Z7901 Long term (current) use of anticoagulants: Secondary | ICD-10-CM | POA: Diagnosis not present

## 2016-05-17 LAB — PROTIME-INR
INR: 4 — ABNORMAL HIGH
Prothrombin Time: 40.1 s — ABNORMAL HIGH (ref 9.0–11.5)

## 2016-05-17 NOTE — Telephone Encounter (Signed)
Pt came in and dropped off Disability parking placard. Sending back for completion. Call pt at [phone] when ready.

## 2016-05-20 ENCOUNTER — Encounter (HOSPITAL_COMMUNITY): Payer: Self-pay | Admitting: Nurse Practitioner

## 2016-05-20 ENCOUNTER — Ambulatory Visit (HOSPITAL_COMMUNITY)
Admission: RE | Admit: 2016-05-20 | Discharge: 2016-05-20 | Disposition: A | Discharge status: Home / Self Care | Payer: Medicare Other | Source: Ambulatory Visit | Attending: Nurse Practitioner | Admitting: Nurse Practitioner

## 2016-05-20 VITALS — BP 120/72 | HR 50 | Ht 68.5 in | Wt 210.0 lb

## 2016-05-20 DIAGNOSIS — Z7901 Long term (current) use of anticoagulants: Secondary | ICD-10-CM | POA: Diagnosis not present

## 2016-05-20 DIAGNOSIS — G90522 Complex regional pain syndrome I of left lower limb: Secondary | ICD-10-CM | POA: Insufficient documentation

## 2016-05-20 DIAGNOSIS — Z885 Allergy status to narcotic agent status: Secondary | ICD-10-CM | POA: Insufficient documentation

## 2016-05-20 DIAGNOSIS — Z91041 Radiographic dye allergy status: Secondary | ICD-10-CM | POA: Diagnosis not present

## 2016-05-20 DIAGNOSIS — Z86711 Personal history of pulmonary embolism: Secondary | ICD-10-CM | POA: Insufficient documentation

## 2016-05-20 DIAGNOSIS — Z86718 Personal history of other venous thrombosis and embolism: Secondary | ICD-10-CM | POA: Diagnosis not present

## 2016-05-20 DIAGNOSIS — Z8711 Personal history of peptic ulcer disease: Secondary | ICD-10-CM | POA: Insufficient documentation

## 2016-05-20 DIAGNOSIS — R002 Palpitations: Secondary | ICD-10-CM

## 2016-05-20 DIAGNOSIS — I2510 Atherosclerotic heart disease of native coronary artery without angina pectoris: Secondary | ICD-10-CM | POA: Insufficient documentation

## 2016-05-20 DIAGNOSIS — Z951 Presence of aortocoronary bypass graft: Secondary | ICD-10-CM | POA: Insufficient documentation

## 2016-05-20 DIAGNOSIS — Z87891 Personal history of nicotine dependence: Secondary | ICD-10-CM | POA: Insufficient documentation

## 2016-05-20 DIAGNOSIS — Z952 Presence of prosthetic heart valve: Secondary | ICD-10-CM | POA: Diagnosis not present

## 2016-05-20 DIAGNOSIS — I10 Essential (primary) hypertension: Secondary | ICD-10-CM | POA: Insufficient documentation

## 2016-05-20 DIAGNOSIS — Z91013 Allergy to seafood: Secondary | ICD-10-CM | POA: Diagnosis not present

## 2016-05-20 DIAGNOSIS — Z888 Allergy status to other drugs, medicaments and biological substances status: Secondary | ICD-10-CM | POA: Insufficient documentation

## 2016-05-20 DIAGNOSIS — D6851 Activated protein C resistance: Secondary | ICD-10-CM | POA: Diagnosis not present

## 2016-05-20 DIAGNOSIS — Z882 Allergy status to sulfonamides status: Secondary | ICD-10-CM | POA: Diagnosis not present

## 2016-05-20 DIAGNOSIS — Z79899 Other long term (current) drug therapy: Secondary | ICD-10-CM | POA: Insufficient documentation

## 2016-05-20 DIAGNOSIS — I4891 Unspecified atrial fibrillation: Secondary | ICD-10-CM | POA: Insufficient documentation

## 2016-05-20 NOTE — Progress Notes (Signed)
Patient ID: Frederick Payne, male   DOB: May 06, 1961, 55 y.o.   MRN: [id number]     Primary Care Physician: Carollee Herter, MD Referring Physician: Roderic Ovens line tirage   Frederick Payne is a 55 y.o. male with a h/o of Leiden 5 deficiency and has had problems with hypercoagulability in the past. He is on long-term Coumadin managed by Dr. Susann Givens. He has had a previous blood clot to his left leg which caused him to have an ischemic neuropathy and he has leg weakness and walks with a limp. He states that he has had 3 separate episodes of pulmonary emboli. He has a inferior vena cava filter in place now. Also has a history of ischemic heart disease and valvular heart disease. In July 2014 he underwent aortic valve replacement with a St. Jude mechanical valve. He also had single-vessel coronary artery bypass graft surgery. He had post op afib that resolved.  He was seen in afib clinic, 7/3,  for palpitations that he has noted over the last few weeks. States that he used to be on 100 mg metoprolol and he did not notice them as much then. He states that he was admitted for  to [facility] in April for typical a flutter for which he was cardioverted x 1. No meds were changed. He has not had any further sustained tachycardia but what he describes,sounds like premature contractions. He has had labile INR's in the past as recently as 10 in April but this was contributed to by his alcohol use. He currently denies any alcohol use. His INR was 1.8  in  the last few days. EKG shows SR, no PC's.  Pt today is in the afib clinic for f/u. Monitor did show PAC's, mostly PVC's, at 2.6 % burden. I cannot go up on BB due to HR of 50. He is asymptomatic with this HR. He does feel palpitations and says they make him tired. Magnesium suggested to help diminish palpitations. Last INR with PCP was at 4.0 with PCP and was told to eat greens.  Today, he denies symptoms of  chest pain, shortness of breath, orthopnea,  PND, lower extremity edema, dizziness, presyncope, syncope, or neurologic sequela.Positive for palpitations. The patient is tolerating medications without difficulties and is otherwise without complaint today.   Past Medical History:  Diagnosis Date  . Aortic stenosis    Had surgery performed  . CAD (coronary artery disease)   . Chronic anemia   . Deep vein thrombosis (HCC) 2006   After a broken ankle--recently diagnosed with reflex sympathetic dystrophy of the left lower extremity  . Elevated LFTs    h/o excessive alcohol  . Factor V Leiden (HCC)   . Hypertension   . Ischemic heart disease   . Psychiatric disorder    Underlying  . PUD (peptic ulcer disease)    with bleeding  . Reflex sympathetic dystrophy    Past Surgical History:  Procedure Laterality Date  . AORTIC VALVE REPLACEMENT  07/15/12   27 mm St Jude  . CORONARY ARTERY BYPASS GRAFT     x1  . GALLBLADDER SURGERY  2008  . gastrectomy and vagotomy    . greenfield filter      Current Outpatient Prescriptions  Medication Sig Dispense Refill  . ACCU-CHEK AVIVA PLUS test strip USE AS DIRECTED 150 each 2  . ascorbic acid (VITAMIN C) 500 MG tablet Take 500 mg by mouth daily.    [name] aspirin 81 MG tablet Take 81 mg by mouth  daily.    [name] CALCIUM PO Take 1 tablet by mouth daily.     Dewaine Conger LANCETS MISC 1 each by Does not apply route as directed. 200 each 1  . Copper Gluconate (COPPER CAPS) 2 MG CAPS Take by mouth every evening.    . ferrous sulfate 325 (65 FE) MG tablet TAKE 1 TABLET BY MOUTH EVERY DAY 90 tablet 0  . fish oil-omega-3 fatty acids 1000 MG capsule Take 1 g by mouth daily.    . Fluticasone Furoate (ARNUITY ELLIPTA) 100 MCG/ACT AEPB Inhale 1 puff into the lungs daily. 30 each 11  . Lifitegrast (XIIDRA) 5 % SOLN Reported on 04/29/2016    . metoprolol succinate (TOPROL-XL) 50 MG 24 hr tablet Take 1/2 tablet (25mg ) in the morning by mouth and 1 tablet (50mg ) in the evening (Patient taking differently: 75 mg. Take 1  and 1/2 tablet (75mg ) in the morning by mouth and 1 tablet (50mg ) in the evening) 45 tablet 3  . Multiple Vitamins-Minerals (ONE-A-DAY MENS 50+ ADVANTAGE PO) Take by mouth daily.    [name] POLICOSANOL PO Take 20 mg by mouth daily.    [name] POLY-IRON 150 150 MG capsule TAKE 1 CAPSULE BY MOUTH EVERY DAY 90 capsule 3  . PT/INR Test (COAGUCHEK PT TEST) STRP 1 each by In Vitro route as directed. 48 each 1  . PT/INR Testing Monitor (COAGUCHEK XS PLUS SYSTEM) KIT 1 each by Does not apply route as directed. 1 kit 0  . warfarin (COUMADIN) 10 MG tablet TAKE 1 TABLET BY MOUTH EVERY DAY 90 tablet 0  . warfarin (COUMADIN) 2 MG tablet TAKE 1 TABLET BY MOUTH DAILY 30 tablet 2  . warfarin (COUMADIN) 5 MG tablet Take 5 mg by mouth daily.    Pauline Aus HFA 45 MCG/ACT inhaler INHALE 1-2 PUFFS INTO THE LUNGS EVERY 4-6 HOURS FOR WHEEZING OR SHORTNESS OF BREATH 15 g 0   No current facility-administered medications for this encounter.     Allergies  Allergen Reactions  . Benzodiazepines Other (See Comments)    Causes pt to get violent   . Contrast Media [Iodinated Diagnostic Agents] Hives  . Iodine Anaphylaxis  . Morphine And Related Other (See Comments)    Said not allergic to Morphine.  Said that he was so that no one would prescribe it to him.  . Other Anaphylaxis    FRUITS FROM TREES.   . Pectin Anaphylaxis  . Shellfish Allergy Anaphylaxis  . Sulfa Antibiotics Anaphylaxis  . Tylenol [Acetaminophen] Nausea And Vomiting    headache  . Codeine Other (See Comments)  . Diazepam Other (See Comments)    Headache, strange behavior, memory loss  . Hydrocodone-Acetaminophen Other (See Comments)    Stomach pains , caused by the tylenol part  . Lactose Intolerance (Gi) Nausea And Vomiting    Social History   Social History  . Marital status: Married    Spouse name: N/A  . Number of children: 2  . Years of education: N/A   Occupational History  . finance-disabled    Social History Main Topics  . Smoking  status: Former Games developer  . Smokeless tobacco: Never Used     Comment: quit 15 + yrs ago  . Alcohol use No  . Drug use: No  . Sexual activity: Yes    Partners: Male   Other Topics Concern  . Not on file   Social History Narrative   Lives at home w/ his partner, Susann Givens and his elderly mother  Right-handed   Drinks about 3 cups of tea per day    Family History  Problem Relation Age of Onset  . Stroke Paternal Grandmother   . Healthy Father     ROS- All systems are reviewed and negative except as per the HPI above  Physical Exam: Vitals:   05/20/16 1138  BP: 120/72  Pulse: (!) 50  Weight: 210 lb (95.3 kg)  Height: 5' 8.5" (1.74 m)    GEN- The patient is well appearing, alert and oriented x 3 today.   Head- normocephalic, atraumatic Eyes-  Sclera clear, conjunctiva pink Ears- hearing intact Oropharynx- clear Neck- supple, no JVP Lymph- no cervical lymphadenopathy Lungs- Clear to ausculation bilaterally, normal work of breathing Heart- Regular rate and rhythm, no murmurs, rubs or gallops, PMI not laterally displaced GI- soft, NT, ND, + BS Extremities- no clubbing, cyanosis, or edema MS- no significant deformity or atrophy Skin- no rash or lesion Psych- euthymic mood, full affect Neuro- strength and sensation are intact  EKG-NSR with LVH at 60 bpm  Echo- 11/13/15 LV EF: 55% - 60% Holter monitor- 7/12- read by Dr. Swaziland, shows frequent PVCs. No recurrent afib. Has follow up in Afib clinic in 2 weeks.  ------------------------------------------------------------------- Indications: PVC&'s (I49.3).  ------------------------------------------------------------------- History: PMH: AVR.  ------------------------------------------------------------------- Study Conclusions  - Left ventricle: The cavity size was normal. Wall thickness was  normal. Systolic function was normal. The estimated ejection  fraction was in the range of 55% to 60%. -  Aortic valve: Normal appearing mechanical AVR with stable trivial  peri valvular regurgitation. Valve area (VTI): 1.74 cm^2. Valve  area (Vmax): 1.8 cm^2. Valve area (Vmean): 1.71 cm^2. - Left atrium: The atrium was mildly dilated. - Atrial septum: No defect or patent foramen ovale was identified.  Last INR per pt 1.8   Assessment and Plan: 1. H/o of afib at time of cardiac surgery,resolved, and a flutter in April,resolved Currently his symptoms sound like palpitations Which  a 48 hour monitor confirmed, PVC's at 2.6% burden Increased metoprolol to 25 am and will continue with his usual 50 mg pm, he can not tolerate any further increase of BB due to current HR of 50 bpm Continue warfarin per PCP Suggest to try magnesium daily for palps  F/u with Dr. Swaziland in 3 months  Elvina Sidle. [facility] [address] [place], [place] [address] [phone]

## 2016-05-20 NOTE — Patient Instructions (Signed)
Follow up with Dr. Martinique in 3 months. They will send you a letter in the mail to schedule the appointment.  May use Over the Counter Magnesium once daily

## 2016-05-21 NOTE — Telephone Encounter (Signed)
Left message for pt, Per JCL need to know why he needs Disability placard

## 2016-05-21 NOTE — Telephone Encounter (Signed)
Pt called to let us know that he needs disability parking placard for his leg issues. Pt says he has been seeing Dr Redmond School for leg issues & was recently sent to a neurologist for his leg as well. He says his legs have been bothering him for years and he's had a disability placard for about 10 years or so.  Also pt wants to let Dr Redmond School know that he has changed his cardiologist to Dr. Landry Corporal

## 2016-05-23 ENCOUNTER — Ambulatory Visit: Payer: Medicare Other

## 2016-05-23 DIAGNOSIS — Z7901 Long term (current) use of anticoagulants: Secondary | ICD-10-CM

## 2016-05-23 DIAGNOSIS — F4310 Post-traumatic stress disorder, unspecified: Secondary | ICD-10-CM | POA: Diagnosis not present

## 2016-05-23 LAB — PROTIME-INR
INR: 2.8 — ABNORMAL HIGH
Prothrombin Time: 28.3 s — ABNORMAL HIGH (ref 9.0–11.5)

## 2016-05-23 NOTE — Telephone Encounter (Signed)
Talked with pt & he states he needs the handicap Placard due to neuro & vascular damage in left leg.  States we sent him to Neurologist for chronic pain & swelling. JCL completed form

## 2016-05-27 DIAGNOSIS — R55 Syncope and collapse: Secondary | ICD-10-CM | POA: Diagnosis not present

## 2016-05-27 DIAGNOSIS — I359 Nonrheumatic aortic valve disorder, unspecified: Secondary | ICD-10-CM | POA: Diagnosis not present

## 2016-05-28 ENCOUNTER — Telehealth: Payer: Self-pay | Admitting: Cardiology

## 2016-05-28 NOTE — Telephone Encounter (Signed)
New message     Pt is requesting a new doctor since he is never able to see Dr. Martinique. He states that the doctor is always booked up. He would like to see Dr. Marlou Porch. Please advise.

## 2016-05-28 NOTE — Telephone Encounter (Signed)
I think he will find Dr. Marlou Porch is booked similar to myself. Efforts are being made to increase my office availability but will probably not take effect until the beginning of the year. If he is adamant about switching it is OK  Zakia Sainato Martinique MD, [facility]

## 2016-06-11 DIAGNOSIS — R569 Unspecified convulsions: Secondary | ICD-10-CM | POA: Diagnosis not present

## 2016-06-14 ENCOUNTER — Telehealth: Payer: Self-pay

## 2016-06-14 NOTE — Telephone Encounter (Signed)
Pt made aware, but still ask for Dr. Redmond School input. Forwarded to Dr. Redmond School- pt is aware that Dr. Redmond School is out of the office. Pt has plane tickets for the am.  Frederick Payne

## 2016-06-14 NOTE — Telephone Encounter (Signed)
I am uncomfortable giving advice concerning this. I would however air on the side of caution and have him not go. He should be set up to have follow-up with someone concerning this.

## 2016-06-14 NOTE — Telephone Encounter (Signed)
Pt called to see if he can go to the [place] following a seizure on Tuesday with 2 day ICU hospitalization? Pt states he is due to leave tomorrow.   Pt can be reached at [phone].

## 2016-06-14 NOTE — Telephone Encounter (Signed)
I don't have records from the recent ICU visit, was it not a [place] facility?  Its hard for me to make a judgement on this, plus Dr. Redmond School is his PCP.  However, gust given the fact he just got out of ICU and had new seizure this week, I would strongly consider not leaving on a trip for a few weeks at the very least given possibly of another seizure or whatever else he was in ICU for.

## 2016-06-14 NOTE — Telephone Encounter (Signed)
Refer to [name] as she is handling

## 2016-06-14 NOTE — Telephone Encounter (Signed)
Pt notified of recommendations. He scheduled hospital f/u as well. Frederick Payne

## 2016-06-17 ENCOUNTER — Telehealth: Payer: Self-pay

## 2016-06-17 ENCOUNTER — Other Ambulatory Visit: Payer: Self-pay | Admitting: Family Medicine

## 2016-06-17 NOTE — Telephone Encounter (Signed)
He has appointment 06/24/16

## 2016-06-17 NOTE — Telephone Encounter (Signed)
He needs a follow-up appointment for about a half an hour

## 2016-06-17 NOTE — Telephone Encounter (Signed)
Hospital D/C summary and labs placed in your folder for hospital f/u appt and review. Frederick Payne

## 2016-06-21 ENCOUNTER — Other Ambulatory Visit: Payer: Self-pay | Admitting: Family Medicine

## 2016-06-21 NOTE — Telephone Encounter (Signed)
Is this okay to refill? 

## 2016-06-24 ENCOUNTER — Ambulatory Visit (INDEPENDENT_AMBULATORY_CARE_PROVIDER_SITE_OTHER): Payer: Medicare Other | Admitting: Family Medicine

## 2016-06-24 ENCOUNTER — Encounter: Payer: Self-pay | Admitting: Family Medicine

## 2016-06-24 VITALS — BP 116/70 | HR 80 | Wt 207.8 lb

## 2016-06-24 DIAGNOSIS — Z7901 Long term (current) use of anticoagulants: Secondary | ICD-10-CM | POA: Diagnosis not present

## 2016-06-24 DIAGNOSIS — F32A Depression, unspecified: Secondary | ICD-10-CM

## 2016-06-24 DIAGNOSIS — F1010 Alcohol abuse, uncomplicated: Secondary | ICD-10-CM | POA: Diagnosis not present

## 2016-06-24 DIAGNOSIS — D6851 Activated protein C resistance: Secondary | ICD-10-CM | POA: Diagnosis not present

## 2016-06-24 DIAGNOSIS — F10939 Alcohol use, unspecified with withdrawal, unspecified: Secondary | ICD-10-CM

## 2016-06-24 DIAGNOSIS — Z23 Encounter for immunization: Secondary | ICD-10-CM

## 2016-06-24 DIAGNOSIS — F10239 Alcohol dependence with withdrawal, unspecified: Secondary | ICD-10-CM | POA: Diagnosis not present

## 2016-06-24 DIAGNOSIS — I259 Chronic ischemic heart disease, unspecified: Secondary | ICD-10-CM

## 2016-06-24 DIAGNOSIS — F329 Major depressive disorder, single episode, unspecified: Secondary | ICD-10-CM | POA: Diagnosis not present

## 2016-06-24 DIAGNOSIS — R569 Unspecified convulsions: Secondary | ICD-10-CM | POA: Diagnosis not present

## 2016-06-24 DIAGNOSIS — F1011 Alcohol abuse, in remission: Secondary | ICD-10-CM

## 2016-06-24 LAB — CBC WITH DIFFERENTIAL/PLATELET
Basophils Absolute: 46 cells/uL (ref 0–200)
Basophils Relative: 1 %
Eosinophils Absolute: 92 cells/uL (ref 15–500)
Eosinophils Relative: 2 %
HCT: 44.5 % (ref 38.5–50.0)
Hemoglobin: 14.7 g/dL (ref 13.2–17.1)
Lymphocytes Relative: 21 %
Lymphs Abs: 966 cells/uL (ref 850–3900)
MCH: 29.3 pg (ref 27.0–33.0)
MCHC: 33 g/dL (ref 32.0–36.0)
MCV: 88.8 fL (ref 80.0–100.0)
MPV: 10.6 fL (ref 7.5–12.5)
Monocytes Absolute: 460 cells/uL (ref 200–950)
Monocytes Relative: 10 %
Neutro Abs: 3036 cells/uL (ref 1500–7800)
Neutrophils Relative %: 66 %
Platelets: 267 10*3/uL (ref 140–400)
RBC: 5.01 MIL/uL (ref 4.20–5.80)
RDW: 17.1 % — ABNORMAL HIGH (ref 11.0–15.0)
WBC: 4.6 10*3/uL (ref 4.0–10.5)

## 2016-06-24 LAB — COMPREHENSIVE METABOLIC PANEL
ALT: 129 U/L — ABNORMAL HIGH (ref 9–46)
AST: 79 U/L — ABNORMAL HIGH (ref 10–35)
Albumin: 4.1 g/dL (ref 3.6–5.1)
Alkaline Phosphatase: 59 U/L (ref 40–115)
BUN: 14 mg/dL (ref 7–25)
CO2: 24 mmol/L (ref 20–31)
Calcium: 9.3 mg/dL (ref 8.6–10.3)
Chloride: 106 mmol/L (ref 98–110)
Creat: 1 mg/dL (ref 0.70–1.33)
Glucose, Bld: 114 mg/dL — ABNORMAL HIGH (ref 65–99)
Potassium: 4 mmol/L (ref 3.5–5.3)
Sodium: 140 mmol/L (ref 135–146)
Total Bilirubin: 0.7 mg/dL (ref 0.2–1.2)
Total Protein: 7 g/dL (ref 6.1–8.1)

## 2016-06-24 MED ORDER — VORTIOXETINE HBR 5 MG PO TABS: 1.0000 | ORAL_TABLET | Freq: Every day | ORAL | 0 refills | Status: DC

## 2016-06-24 NOTE — Progress Notes (Signed)
Subjective:    Patient ID: Frederick Payne, male    DOB: [birth year], 55 y.o.   MRN: [id number]  HPI He is here for consult after recent hospitalization and treatment for alcohol withdrawal seizure. He has a long history of difficulty with alcohol abuse. He also has factor V Leiden and has had valve replacement. He is on Coumadin however during this episode he stopped eating and continued to drink. He subsequently had an alcohol withdrawal seizure and was admitted to the hospital for treatment. He has reviewed his history of difficulty with bouts of depression. He is apparent he tried various medications and has had difficulty with them mainly from side effects. He is presently in counseling with [facility] Scrape whom he seems to like. He does check his PT/INR at home and recently was 2.2 on 12 mg of Coumadin. He did increase this to 15 mg per 2 days and then plans to cut back on his greens. Presently he is eating better and avoiding alcohol. He is in counseling.   Review of Systems     Objective:   Physical Exam Alert and in no distress. Exam of his skin shows no evidence of bleeding. Dressed appropriately and with appropriate affect. The hospital record was reviewed.       Assessment & Plan:  Need for prophylactic vaccination and inoculation against influenza - Plan: Flu Vaccine QUAD 36+ mos IM  Long term (current) use of anticoagulants - Plan: CBC with Differential/Platelet, Comprehensive metabolic panel  History of alcohol abuse - Plan: CBC with Differential/Platelet, Comprehensive metabolic panel  Factor V Leiden (HCC) - Plan: CBC with Differential/Platelet, Comprehensive metabolic panel  Alcohol withdrawal seizure, with unspecified complication (HCC) - Plan: CBC with Differential/Platelet, Comprehensive metabolic panel  Depression - Plan: vortioxetine HBr (TRINTELLIX) 5 MG TABS I had a long discussion with him concerning taking his medications, alcohol use and abuse as well as his  eating habits. He will continue to be followed by [facility] Scrape. I will try him on Trintellix. He will call me next week to let me know how he is doing. I will try to get him more stable to help reduce likelihood of another bout of depression. If I run into trouble, I will then refer to psychiatry. Over 40 minutes, greater than 50% spent in counseling and coordination of care.

## 2016-06-25 DIAGNOSIS — F4310 Post-traumatic stress disorder, unspecified: Secondary | ICD-10-CM | POA: Diagnosis not present

## 2016-06-27 ENCOUNTER — Encounter: Payer: Self-pay | Admitting: Cardiology

## 2016-06-27 DIAGNOSIS — E668 Other obesity: Secondary | ICD-10-CM | POA: Diagnosis not present

## 2016-06-27 DIAGNOSIS — I493 Ventricular premature depolarization: Secondary | ICD-10-CM | POA: Diagnosis not present

## 2016-06-27 DIAGNOSIS — Z86711 Personal history of pulmonary embolism: Secondary | ICD-10-CM | POA: Diagnosis not present

## 2016-06-27 DIAGNOSIS — I480 Paroxysmal atrial fibrillation: Secondary | ICD-10-CM | POA: Diagnosis not present

## 2016-06-27 DIAGNOSIS — I2510 Atherosclerotic heart disease of native coronary artery without angina pectoris: Secondary | ICD-10-CM | POA: Diagnosis not present

## 2016-06-27 DIAGNOSIS — D6851 Activated protein C resistance: Secondary | ICD-10-CM | POA: Diagnosis not present

## 2016-06-27 DIAGNOSIS — Z7901 Long term (current) use of anticoagulants: Secondary | ICD-10-CM | POA: Diagnosis not present

## 2016-06-27 DIAGNOSIS — Z952 Presence of prosthetic heart valve: Secondary | ICD-10-CM | POA: Diagnosis not present

## 2016-07-04 ENCOUNTER — Telehealth: Payer: Self-pay | Admitting: Neurology

## 2016-07-04 DIAGNOSIS — G90522 Complex regional pain syndrome I of left lower limb: Secondary | ICD-10-CM

## 2016-07-04 NOTE — Telephone Encounter (Signed)
I called the patient. The TENS unit did not help him. We may consider a spinal stimulator, I will get a referral set up for NS[facility]  To see if they will place a stimulator.

## 2016-07-04 NOTE — Telephone Encounter (Signed)
Patient called to advise, Tens unit isn't working. Please call [phone].

## 2016-07-04 NOTE — Telephone Encounter (Signed)
Called and spoke to pt. He reports that TENs unit is not helping w/ lower extremity pain. He's still not interested in using pain meds at this time. Would like to discuss possibility of trying spinal stimulator. Currently has a follow-up appt scheduled w/ Dr. Jannifer [place] on 08/22/16.

## 2016-07-04 NOTE — Addendum Note (Signed)
Addended by: Margette Fast on: 07/04/2016 07:25 PM   Modules accepted: Orders

## 2016-07-09 DIAGNOSIS — F4310 Post-traumatic stress disorder, unspecified: Secondary | ICD-10-CM | POA: Diagnosis not present

## 2016-07-11 ENCOUNTER — Telehealth: Payer: Self-pay | Admitting: Neurology

## 2016-07-11 MED ORDER — PREGABALIN 50 MG PO CAPS: 50.0000 mg | ORAL_CAPSULE | Freq: Two times a day (BID) | ORAL | 2 refills | Status: DC

## 2016-07-11 NOTE — Telephone Encounter (Signed)
Pt called said he is ready to take "pain medication" now. sts he is in a lot of pain. Please call

## 2016-07-11 NOTE — Telephone Encounter (Signed)
I called patient. Initially he did not wish to go on any medications for discomfort. He has an allergy to opiate medications, he has been on gabapentin previously without much benefit, we will try Lyrica. The patient is on Coumadin and Lyrica should not interact with this.  He will be going for spinal stimulator placement in the near future.

## 2016-07-11 NOTE — Telephone Encounter (Signed)
Rx printed, signed, faxed to pharmacy. 

## 2016-07-12 ENCOUNTER — Encounter: Payer: Self-pay | Admitting: Family Medicine

## 2016-07-13 DIAGNOSIS — J9811 Atelectasis: Secondary | ICD-10-CM | POA: Diagnosis not present

## 2016-07-13 DIAGNOSIS — R079 Chest pain, unspecified: Secondary | ICD-10-CM | POA: Diagnosis not present

## 2016-07-13 DIAGNOSIS — M940 Chondrocostal junction syndrome [Tietze]: Secondary | ICD-10-CM | POA: Diagnosis not present

## 2016-07-13 DIAGNOSIS — I2510 Atherosclerotic heart disease of native coronary artery without angina pectoris: Secondary | ICD-10-CM | POA: Diagnosis not present

## 2016-07-23 ENCOUNTER — Ambulatory Visit: Payer: Medicare Other | Admitting: Family Medicine

## 2016-08-05 ENCOUNTER — Telehealth: Payer: Self-pay | Admitting: Hematology

## 2016-08-05 NOTE — Telephone Encounter (Signed)
COVERING AP - CALLED PATIENT RE MOVING LAB/FU FROM 10/18 TO 10/19. PATIENT WOULD LIKE TO CALL BACK TO RESCHEDULE..... PATIENT AWARE 10/18 CXD AND WE WILL WAIT TO HEAR BACK FROM HIM.

## 2016-08-14 ENCOUNTER — Ambulatory Visit: Payer: Medicare Other | Admitting: Hematology

## 2016-08-14 ENCOUNTER — Other Ambulatory Visit: Payer: Medicare Other

## 2016-08-22 ENCOUNTER — Encounter: Payer: Self-pay | Admitting: Neurology

## 2016-08-22 ENCOUNTER — Ambulatory Visit (INDEPENDENT_AMBULATORY_CARE_PROVIDER_SITE_OTHER): Payer: Medicare Other | Admitting: Neurology

## 2016-08-22 VITALS — BP 112/84 | HR 73 | Ht 68.5 in | Wt 199.0 lb

## 2016-08-22 DIAGNOSIS — G90522 Complex regional pain syndrome I of left lower limb: Secondary | ICD-10-CM | POA: Diagnosis not present

## 2016-08-22 DIAGNOSIS — I259 Chronic ischemic heart disease, unspecified: Secondary | ICD-10-CM | POA: Diagnosis not present

## 2016-08-22 NOTE — Progress Notes (Signed)
Reason for visit: Reflex sympathetic dystrophy  Frederick Payne is an 55 y.o. male  History of present illness:  Frederick Payne is a 55 year old right-handed white male with a history of left leg discomfort felt secondary to a complex regional pain syndrome. The patient has been seen through a pain center in [place], but apparently they never did sympathetic nerve blocks to control the pain. The patient had been placed on opiate medications, he does not wish to go on medication such as this, he does not wish to take pills for his discomfort. He was given a trial on Lyrica, but he could not tolerate the medication. The patient will have occasional sharp shooting pains or electric shock pains at night, he has hypersensitivity to light touch from the knee down. The patient was given a referral to the neurosurgical group in town for consideration of a spinal stimulator, but apparently there were scheduling problems and the patient never got to see a Psychologist, sport and exercise. The patient was given a TENS unit trial which did not help, but he only used the electrodes on the back, not on the leg itself. The patient returns for an evaluation. He claims that he recently was in the hospital 4 or 6 weeks ago for a generalized seizure. The patient had apparently stopped eating for about 5 days, trying to lose weight. His sodium level dropped to around 121. He has not been placed on seizure medications.  Past Medical History:  Diagnosis Date  . Aortic stenosis    Had surgery performed  . CAD (coronary artery disease)   . Chronic anemia   . Deep vein thrombosis ([place]) 2006   After a broken ankle--recently diagnosed with reflex sympathetic dystrophy of the left lower extremity  . Elevated LFTs    h/o excessive alcohol  . Factor V Leiden ([place])   . Hypertension   . Ischemic heart disease   . Psychiatric disorder    Underlying  . PUD (peptic ulcer disease)    with bleeding  . Reflex sympathetic dystrophy     Past  Surgical History:  Procedure Laterality Date  . AORTIC VALVE REPLACEMENT  07/15/12   27 mm St Jude  . CORONARY ARTERY BYPASS GRAFT     x1  . GALLBLADDER SURGERY  2008  . gastrectomy and vagotomy    . greenfield filter      Family History  Problem Relation Age of Onset  . Healthy Father   . Stroke Paternal Grandmother     Social history:  reports that he has quit smoking. He has never used smokeless tobacco. He reports that he does not drink alcohol or use drugs.    Allergies  Allergen Reactions  . Benzodiazepines Other (See Comments)    Causes pt to get violent   . Contrast Media [Iodinated Diagnostic Agents] Hives  . Iodine Anaphylaxis  . Other Anaphylaxis    FRUITS FROM TREES.   . Pectin Anaphylaxis  . Shellfish Allergy Anaphylaxis  . Sulfa Antibiotics Anaphylaxis  . Codeine Other (See Comments)  . Diazepam Other (See Comments)    Headache, strange behavior, memory loss  . Hydrocodone-Acetaminophen Other (See Comments)    Stomach pains , caused by the tylenol part  . Lactose Intolerance (Gi) Nausea And Vomiting    Medications:  Prior to Admission medications   Medication Sig Start Date End Date Taking? Authorizing Provider  ACCU-CHEK AVIVA PLUS test strip USE AS DIRECTED 02/15/16  Yes Gautam Juleen China, MD  ascorbic acid (VITAMIN  C) 500 MG tablet Take 500 mg by mouth daily.   Yes Historical Provider, MD  aspirin 81 MG tablet Take 81 mg by mouth daily.   Yes Historical Provider, MD  CALCIUM PO Take 1 tablet by mouth daily.    Yes Historical Provider, MD  COAGUCHEK LANCETS MISC 1 each by Does not apply route as directed. 02/14/16  Yes Brunetta Genera, MD  Copper Gluconate (COPPER CAPS) 2 MG CAPS Take by mouth every evening.   Yes Historical Provider, MD  ferrous sulfate 325 (65 FE) MG tablet TAKE 1 TABLET BY MOUTH EVERY DAY 06/22/16  Yes Denita Lung, MD  fish oil-omega-3 fatty acids 1000 MG capsule Take 1 g by mouth daily.   Yes Historical Provider, MD    Fluticasone Furoate (ARNUITY ELLIPTA) 100 MCG/ACT AEPB Inhale 1 puff into the lungs daily. 02/15/16  Yes Denita Lung, MD  Lifitegrast Shirley Friar) 5 % SOLN Reported on 04/29/2016 11/07/15  Yes Historical Provider, MD  metoprolol succinate (TOPROL-XL) 50 MG 24 hr tablet Take 1/2 tablet ('25mg'$ ) in the morning by mouth and 1 tablet ('50mg'$ ) in the evening Patient taking differently: 75 mg. Take 1 and 1/2 tablet ('75mg'$ ) in the morning by mouth and 1 tablet ('50mg'$ ) in the evening 04/29/16  Yes Sherran Needs, NP  Multiple Vitamins-Minerals (ONE-A-DAY MENS 50+ ADVANTAGE PO) Take by mouth daily.   Yes Historical Provider, MD  POLICOSANOL PO Take 20 mg by mouth daily.   Yes Historical Provider, MD  POLY-IRON 150 150 MG capsule TAKE 1 CAPSULE BY MOUTH EVERY DAY 01/15/16  Yes Denita Lung, MD  PT/INR Test [facility] PT TEST) STRP 1 each by In Vitro route as directed. 02/14/16  Yes Brunetta Genera, MD  PT/INR Testing Monitor (COAGUCHEK XS PLUS SYSTEM) KIT 1 each by Does not apply route as directed. 02/14/16  Yes Brunetta Genera, MD  Thiamine HCl (B-1) 100 MG TABS Take by mouth.   Yes Historical Provider, MD  vortioxetine HBr (TRINTELLIX) 5 MG TABS Take 1 tablet (5 mg total) by mouth daily. 06/24/16  Yes Denita Lung, MD  warfarin (COUMADIN) 10 MG tablet TAKE 1 TABLET BY MOUTH EVERY DAY Patient taking differently: TAKE 1 TABLET BY MOUTH EVERY DAY - currently taking 15 mg daily 06/17/16  Yes Denita Lung, MD  warfarin (COUMADIN) 2 MG tablet TAKE 1 TABLET BY MOUTH DAILY 01/08/16  Yes Denita Lung, MD  warfarin (COUMADIN) 5 MG tablet Take 5 mg by mouth daily.   Yes Historical Provider, MD  XOPENEX HFA 45 MCG/ACT inhaler INHALE 1-2 PUFFS INTO THE LUNGS EVERY 4-6 HOURS FOR WHEEZING OR SHORTNESS OF BREATH 02/05/16  Yes Denita Lung, MD  pregabalin (LYRICA) 50 MG capsule Take 1 capsule (50 mg total) by mouth 2 (two) times daily. Patient not taking: Reported on 08/22/2016 07/11/16   Kathrynn Ducking, MD     ROS:  Out of a complete 14 system review of symptoms, the patient complains only of the following symptoms, and all other reviewed systems are negative.  Eye itching, blurred vision Palpitations of the heart Frequent waking Food allergies Bruising easily Seizure  Blood pressure 112/84, pulse 73, height 5' 8.5" (1.74 m), weight 199 lb (90.3 kg).  Physical Exam  General: The patient is alert and cooperative at the time of the examination.  Skin: No significant peripheral edema is noted.   Neurologic Exam  Mental status: The patient is alert and oriented x 3 at the time  of the examination. The patient has apparent normal recent and remote memory, with an apparently normal attention span and concentration ability.   Cranial nerves: Facial symmetry is present. Speech is normal, no aphasia or dysarthria is noted. Extraocular movements are full. Visual fields are full.  Motor: The patient has good strength in all 4 extremities.  Sensory examination: Soft touch sensation is symmetric on the face, arms, and legs.  Coordination: The patient has good finger-nose-finger and heel-to-shin bilaterally, but the patient has hypersensitivity to light touch on the left leg below the knee.  Gait and station: The patient has a normal gait. Tandem gait is normal. Romberg is negative. No drift is seen.  Reflexes: Deep tendon reflexes are symmetric.   Assessment/Plan:  1. Complex regional pain syndrome, left leg  2. Recent seizure, symptomatic  The patient does not wish to go on any opiate medications or other oral medications for his discomfort. I will make a referral to Dr. Nicholaus Bloom for evaluation for the left leg pain, I would question whether sympathetic nerve blocks may be of some benefit, or whether a spinal stimulator may help him. The patient will follow-up in 6 months.  Jill Alexanders MD 08/22/2016 9:34 AM  [facility] [address] [place], [place] [address]  Phone [phone] Fax [phone]

## 2016-09-06 ENCOUNTER — Other Ambulatory Visit (HOSPITAL_COMMUNITY): Payer: Self-pay | Admitting: Nurse Practitioner

## 2016-09-06 ENCOUNTER — Other Ambulatory Visit: Payer: Self-pay | Admitting: Family Medicine

## 2016-09-06 NOTE — Telephone Encounter (Signed)
Last INR 2.6 in 05/2016 scanned in EMR. OK to fill? When should pt return?   Thanks, [name]

## 2016-09-16 ENCOUNTER — Other Ambulatory Visit: Payer: Self-pay | Admitting: Family Medicine

## 2016-09-16 NOTE — Telephone Encounter (Signed)
Is this okay to refill? 

## 2016-10-04 ENCOUNTER — Other Ambulatory Visit: Payer: Self-pay | Admitting: Family Medicine

## 2016-10-04 NOTE — Telephone Encounter (Signed)
Is this okay to refill? 

## 2016-10-11 ENCOUNTER — Telehealth: Payer: Self-pay

## 2016-10-11 MED ORDER — METOPROLOL SUCCINATE ER 50 MG PO TB24: ORAL_TABLET | ORAL | 6 refills | Status: DC

## 2016-10-11 NOTE — Telephone Encounter (Signed)
Spoke to patient.I received a refill request for metoprolol.Advised he needs follow up appointment with Dr.Jordan.Appointment scheduled 10/31/16 at 11:00 am.Metoprolol refill sent to pharmacy.

## 2016-10-14 ENCOUNTER — Other Ambulatory Visit: Payer: Self-pay | Admitting: Family Medicine

## 2016-10-15 ENCOUNTER — Encounter: Payer: Self-pay | Admitting: Family Medicine

## 2016-10-15 ENCOUNTER — Ambulatory Visit (INDEPENDENT_AMBULATORY_CARE_PROVIDER_SITE_OTHER): Payer: Medicare Other | Admitting: Family Medicine

## 2016-10-15 VITALS — BP 90/70 | HR 69 | Wt 197.0 lb

## 2016-10-15 DIAGNOSIS — D6851 Activated protein C resistance: Secondary | ICD-10-CM | POA: Diagnosis not present

## 2016-10-15 DIAGNOSIS — G894 Chronic pain syndrome: Secondary | ICD-10-CM

## 2016-10-15 DIAGNOSIS — Z7901 Long term (current) use of anticoagulants: Secondary | ICD-10-CM | POA: Diagnosis not present

## 2016-10-15 DIAGNOSIS — K921 Melena: Secondary | ICD-10-CM

## 2016-10-15 DIAGNOSIS — I259 Chronic ischemic heart disease, unspecified: Secondary | ICD-10-CM | POA: Diagnosis not present

## 2016-10-15 LAB — CBC WITH DIFFERENTIAL/PLATELET
Basophils Absolute: 39 cells/uL (ref 0–200)
Basophils Relative: 1 %
Eosinophils Absolute: 78 cells/uL (ref 15–500)
Eosinophils Relative: 2 %
HCT: 43.5 % (ref 38.5–50.0)
Hemoglobin: 14.4 g/dL (ref 13.2–17.1)
Lymphocytes Relative: 33 %
Lymphs Abs: 1287 cells/uL (ref 850–3900)
MCH: 29.9 pg (ref 27.0–33.0)
MCHC: 33.1 g/dL (ref 32.0–36.0)
MCV: 90.4 fL (ref 80.0–100.0)
MPV: 10.5 fL (ref 7.5–12.5)
Monocytes Absolute: 234 cells/uL (ref 200–950)
Monocytes Relative: 6 %
Neutro Abs: 2262 cells/uL (ref 1500–7800)
Neutrophils Relative %: 58 %
Platelets: 150 10*3/uL (ref 140–400)
RBC: 4.81 MIL/uL (ref 4.20–5.80)
RDW: 15.1 % — ABNORMAL HIGH (ref 11.0–15.0)
WBC: 3.9 10*3/uL — ABNORMAL LOW (ref 4.0–10.5)

## 2016-10-15 LAB — PROTIME-INR
INR: 2.3 — ABNORMAL HIGH
Prothrombin Time: 23.5 s — ABNORMAL HIGH (ref 9.0–11.5)

## 2016-10-15 NOTE — Progress Notes (Signed)
Subjective:    Patient ID: Frederick Payne, male    DOB: 15-May-1961, 55 y.o.   MRN: [id number]  HPI He is here for consult concerning his PT/INR. He checks it weekly and on Sunday it was noted to be 6.3. He then held his Coumadin for 2 days and is here for consult concerning that. The previous PT/INR was 3.3. He does admit to not eating his normal amount of greens in the last week or so. He is also noted a day history of black stool as well as occasionally becoming dizzy when he stands up. He has had one episode of epistaxis but no excessive bruising, bleeding from the lips or hematuria. He continues have difficulty with neuropathic pain which does interfere with his ambulation. He is apparently scheduled for a follow-up procedure for an implant.   Review of Systems     Objective:   Physical Exam Alert and in no distress. Postural signs are recorded and are borderline. Exam of the skin shows no major bruising or ecchymosis.       Assessment & Plan:  Factor V Leiden (HCC) - Plan: Protime-INR, CBC with Differential/Platelet  Chronic pain syndrome  Long term current use of anticoagulant therapy - Plan: Protime-INR, CBC with Differential/Platelet  Black stools - Plan: Protime-INR, CBC with Differential/Platelet Cautioned concerning potential blood loss And possible need for blood transfusion. I will wait on this CBC as well as a PT/INR for further intervention. He will hold off on Coumadin until her blood work is back

## 2016-10-18 ENCOUNTER — Telehealth: Payer: Self-pay

## 2016-10-18 MED ORDER — VORTIOXETINE HBR 5 MG PO TABS: 1.0000 | ORAL_TABLET | Freq: Every day | ORAL | 0 refills | Status: DC

## 2016-10-18 NOTE — Telephone Encounter (Signed)
Per Dr. Redmond School- ok to renew. rx sent to pharmacy

## 2016-10-18 NOTE — Telephone Encounter (Signed)
Pt states you gave him samples of tritellix. He is requesting a script be sent to [place]

## 2016-10-25 ENCOUNTER — Other Ambulatory Visit: Payer: Self-pay | Admitting: Family Medicine

## 2016-10-25 NOTE — Telephone Encounter (Signed)
Is this okay to refill? 

## 2016-10-31 ENCOUNTER — Ambulatory Visit: Payer: Medicare Other | Admitting: Cardiology

## 2016-11-06 ENCOUNTER — Telehealth: Payer: Self-pay | Admitting: Family Medicine

## 2016-11-06 MED ORDER — OSELTAMIVIR PHOSPHATE 75 MG PO CAPS: 75.0000 mg | ORAL_CAPSULE | Freq: Every day | ORAL | 0 refills | Status: DC

## 2016-11-06 NOTE — Telephone Encounter (Signed)
Pt called and stated the Grandmother, who they provide are for, was diagnosed with the flu yesterday. He is requesting Tamiflu be sent in for him. Pt uses [facility] [address] [place] and can be reached at [phone].

## 2016-11-06 NOTE — Telephone Encounter (Signed)
Done

## 2016-11-07 ENCOUNTER — Other Ambulatory Visit: Payer: Self-pay | Admitting: Family Medicine

## 2016-11-08 NOTE — Telephone Encounter (Signed)
Is this okay to refill? 

## 2016-11-20 ENCOUNTER — Other Ambulatory Visit: Payer: Self-pay | Admitting: Family Medicine

## 2016-11-29 ENCOUNTER — Encounter: Payer: Self-pay | Admitting: Cardiology

## 2016-11-29 ENCOUNTER — Ambulatory Visit (INDEPENDENT_AMBULATORY_CARE_PROVIDER_SITE_OTHER): Payer: Medicare Other | Admitting: Cardiology

## 2016-11-29 VITALS — BP 96/64 | HR 60 | Ht 68.0 in | Wt 193.4 lb

## 2016-11-29 DIAGNOSIS — I2510 Atherosclerotic heart disease of native coronary artery without angina pectoris: Secondary | ICD-10-CM | POA: Diagnosis not present

## 2016-11-29 DIAGNOSIS — Z952 Presence of prosthetic heart valve: Secondary | ICD-10-CM | POA: Diagnosis not present

## 2016-11-29 DIAGNOSIS — I493 Ventricular premature depolarization: Secondary | ICD-10-CM

## 2016-11-29 DIAGNOSIS — I4892 Unspecified atrial flutter: Secondary | ICD-10-CM

## 2016-11-29 NOTE — Patient Instructions (Signed)
Continue your current therapy  Stay away from alcohol ingestion  I will see you in 6 months.

## 2016-11-29 NOTE — Progress Notes (Signed)
Cardiology Office Note    Date:  11/29/2016   ID:  Frederick Payne, DOB 1961-01-15, MRN [id number]  PCP:  Carollee Herter, MD  Cardiologist:  Peter Swaziland, MD    History of Present Illness:  Frederick Payne is a 56 y.o. male seen for follow s/p AVR with Bentall procedure. He also has a history of atrial flutter. He has a history of Leiden 5 deficiency and has had problems with hypercoagulability in the past.  He is on long-term Coumadin managed by Dr. Susann Givens.  He has had a previous blood clot to his left leg which caused him to have an ischemic neuropathy and he has leg weakness and walks with a limp.  He states that he has had 3 separate episodes of pulmonary emboli.  He has a inferior vena cava filter in place now.  He has a history of ischemic heart disease and valvular heart disease.  In July 2014 he underwent aortic valve replacement with a St. Jude mechanical valve.  He also had single-vessel coronary artery bypass graft surgery. In April 2017 he was admitted to [facility] with typical atrial flutter and was cardioverted. He thinks he was binge drinking at the time. Cardiac cath done at that time and showed nonobstructive CAD. He has since been seen in our Afib clinic with palpitations. 48 hr. Holter monitor showed PACs and PVCs (burden 2.6%) but no Afib or flutter. He also had a 10 day event monitor in Jan 2017 that only showed a single PAC. He was admitted to [facility] in August with a seizure. He had severe hyponatremia, acidosis and elevated CK. This resolved with IVF. He was also drinking heavily at that time.  Since then he is no longer drinking Etoh. His palpitations are doing well. No chest pain or dyspnea. No edema. Reports INRs have been good.     Past Medical History:  Diagnosis Date  . Aortic stenosis    Had surgery performed  . CAD (coronary artery disease)   . Chronic anemia   . Deep vein thrombosis (HCC) 2006   After a broken ankle--recently  diagnosed with reflex sympathetic dystrophy of the left lower extremity  . Elevated LFTs    h/o excessive alcohol  . Factor V Leiden (HCC)   . Hypertension   . Ischemic heart disease   . Psychiatric disorder    Underlying  . PUD (peptic ulcer disease)    with bleeding  . Reflex sympathetic dystrophy     Past Surgical History:  Procedure Laterality Date  . AORTIC VALVE REPLACEMENT  07/15/12   27 mm St Jude  . CORONARY ARTERY BYPASS GRAFT     x1  . GALLBLADDER SURGERY  2008  . gastrectomy and vagotomy    . greenfield filter      Current Medications: Outpatient Medications Prior to Visit  Medication Sig Dispense Refill  . ascorbic acid (VITAMIN C) 500 MG tablet Take 500 mg by mouth daily.    [name] aspirin 81 MG tablet Take 81 mg by mouth daily.    Dewaine Conger LANCETS MISC 1 each by Does not apply route as directed. 200 each 1  . Copper Gluconate (COPPER CAPS) 2 MG CAPS Take by mouth every evening.    . ferrous sulfate 325 (65 FE) MG tablet TAKE 1 TABLET BY MOUTH EVERY DAY 90 tablet 1  . fish oil-omega-3 fatty acids 1000 MG capsule Take 1 g by mouth daily.    . Fluticasone Furoate (ARNUITY ELLIPTA) 100  MCG/ACT AEPB Inhale 1 puff into the lungs daily. 30 each 11  . levalbuterol (XOPENEX HFA) 45 MCG/ACT inhaler INHALE 1-2 PUFFS INTO THE LUNGS EVERY 4-6 HOURS FOR WHEEZING OR SHORTNESS OF BREATH 15 g 0  . metoprolol succinate (TOPROL-XL) 50 MG 24 hr tablet TAKE ONE-HALF TABLET BY MOUTH IN THE MORNING AND ONE TABLET IN THE EVENING 45 tablet 6  . Multiple Vitamins-Minerals (ONE-A-DAY MENS 50+ ADVANTAGE PO) Take by mouth daily.    [name] oseltamivir (TAMIFLU) 75 MG capsule Take 1 capsule (75 mg total) by mouth daily. 10 capsule 0  . POLICOSANOL PO Take 20 mg by mouth daily.    [name] POLY-IRON 150 150 MG capsule TAKE 1 CAPSULE BY MOUTH EVERY DAY 90 capsule 3  . PT/INR Test (COAGUCHEK PT TEST) STRP 1 each by In Vitro route as directed. 48 each 1  . PT/INR Testing Monitor (COAGUCHEK XS PLUS SYSTEM) KIT  1 each by Does not apply route as directed. 1 kit 0  . Thiamine HCl (B-1) 100 MG TABS Take by mouth.    . TRINTELLIX 5 MG TABS TAKE 1 TABLET(5 MG) BY MOUTH DAILY 30 tablet 1  . warfarin (COUMADIN) 10 MG tablet TAKE 1 TABLET BY MOUTH EVERY DAY 90 tablet 1  . warfarin (COUMADIN) 2 MG tablet TAKE 1 TABLET BY MOUTH DAILY 30 tablet 2  . warfarin (COUMADIN) 2 MG tablet TAKE 1 TABLET BY MOUTH DAILY 30 tablet 5  . warfarin (COUMADIN) 5 MG tablet Take 5 mg by mouth daily.    Pauline Aus HFA 45 MCG/ACT inhaler INHALE 1-2 PUFFS INTO THE LUNGS EVERY 4-6 HOURS FOR WHEEZING OR SHORTNESS OF BREATH 15 g 0  . CALCIUM PO Take 1 tablet by mouth daily.     Rinaldo Ratel Benay Spice) 5 % SOLN Reported on 04/29/2016     No facility-administered medications prior to visit.      Allergies:   Benzodiazepines; Contrast media [iodinated diagnostic agents]; Iodine; Other; Pectin; Shellfish allergy; Sulfa antibiotics; Codeine; Diazepam; Hydrocodone-acetaminophen; and Lactose intolerance (gi)   Social History   Social History  . Marital status: Married    Spouse name: N/A  . Number of children: 2  . Years of education: N/A   Occupational History  . finance-disabled    Social History Main Topics  . Smoking status: Former Games developer  . Smokeless tobacco: Never Used     Comment: quit 15 + yrs ago  . Alcohol use No  . Drug use: No  . Sexual activity: Yes    Partners: Male   Other Topics Concern  . None   Social History Narrative   Lives at home w/ his partner, Susann Givens and his elderly mother   Right-handed   Drinks about 3 cups of tea per day     Family History:  The patient's family history includes Healthy in his father; Stroke in his paternal grandmother.   ROS:   Please see the history of present illness.    ROS All other systems reviewed and are negative.   PHYSICAL EXAM:   VS:  BP 96/64 (BP Location: Right Arm, Patient Position: Sitting, Cuff Size: Normal)   Pulse 60   Ht 5\' 8"  (1.727 m)   Wt 193 lb  6.4 oz (87.7 kg)   BMI 29.41 kg/m    GEN: Well nourished, well developed, in no acute distress  HEENT: normal  Neck: no JVD, carotid bruits, or masses Cardiac: RRR; Good mechanical AV click. No murmurs, rubs, or gallops,no edema  Respiratory:  clear to auscultation bilaterally, normal work of breathing GI: soft, nontender, nondistended, + BS MS: no deformity or atrophy  Skin: warm and dry, no rash Neuro:  Alert and Oriented x 3, Strength and sensation are intact Psych: euthymic mood, full affect  Wt Readings from Last 3 Encounters:  11/29/16 193 lb 6.4 oz (87.7 kg)  10/15/16 197 lb (89.4 kg)  08/22/16 199 lb (90.3 kg)      Studies/Labs Reviewed:   EKG:  EKG is not ordered today.  The ekg ordered today demonstrates N/A  Recent Labs: 06/24/2016: ALT 129; BUN 14; Creat 1.00; Potassium 4.0; Sodium 140 10/15/2016: Hemoglobin 14.4; Platelets 150   Lipid Panel No results found for: CHOL, TRIG, HDL, CHOLHDL, VLDL, LDLCALC, LDLDIRECT  Additional studies/ records that were reviewed today include:  Holter monitor 05/02/16: Study Highlights    Normal sinus rhythm  PVCs- frequent with some trigeminy and couplets.  Very rare PACs.   Echo 11/13/15: Study Conclusions  - Left ventricle: The cavity size was normal. Wall thickness was   normal. Systolic function was normal. The estimated ejection   fraction was in the range of 55% to 60%. - Aortic valve: Normal appearing mechanical AVR with stable trivial   peri valvular regurgitation. Valve area (VTI): 1.74 cm^2. Valve   area (Vmax): 1.8 cm^2. Valve area (Vmean): 1.71 cm^2. - Left atrium: The atrium was mildly dilated. - Atrial septum: No defect or patent foramen ovale was identified.   Cardiac cath 01/26/16:RESULTS:   Coronary arteriograms and bypass graft angiogram 1. Left main- normal 2. Left anterior descending- 30% proximal; D1- normal 3. Left circumflex- 20% mid; OM1- 20% proximal 4. Right coronary- dominant vessel which  is normal 5. Left internal mammary artery bypass graft LAD is widely patent.   CONCLUSIONS:  1. Two-vessel nonobstructive native coronary disease. 2. LIMA to LAD is widely patent. 3. Since she mechanical valve demonstrates normal opening under fluoroscopy.   ASSESSMENT:    1. PVC's (premature ventricular contractions)   2. Coronary artery disease involving native coronary artery of native heart without angina pectoris   3. Atrial flutter with rapid ventricular response (HCC)   4. S/P AVR      PLAN:  In order of problems listed above:  1. Patient's arrhythmias have improved significantly since cessation of Etoh consumption. Will continue metoprolol. Encourage Etoh avoidance 2. No angina. Cardiac cath in April 2017 showed nonobstructive CAD 3. S/p AVR. Continue coumadin target INR 2.5-3.5. Routine SBE prophylaxis.     Medication Adjustments/Labs and Tests Ordered: Current medicines are reviewed at length with the patient today.  Concerns regarding medicines are outlined above.  Medication changes, Labs and Tests ordered today are listed in the Patient Instructions below. Patient Instructions  Continue your current therapy  Stay away from alcohol ingestion  I will see you in 6 months.      Signed, Peter Swaziland, MD  11/29/2016 1:58 PM    [facility] [address] [place], [place], [address] [phone]

## 2016-11-30 DIAGNOSIS — W1789XA Other fall from one level to another, initial encounter: Secondary | ICD-10-CM | POA: Diagnosis not present

## 2016-11-30 DIAGNOSIS — S299XXA Unspecified injury of thorax, initial encounter: Secondary | ICD-10-CM | POA: Diagnosis not present

## 2016-11-30 DIAGNOSIS — M25562 Pain in left knee: Secondary | ICD-10-CM | POA: Diagnosis not present

## 2016-11-30 DIAGNOSIS — S79912A Unspecified injury of left hip, initial encounter: Secondary | ICD-10-CM | POA: Diagnosis not present

## 2016-11-30 DIAGNOSIS — S80212A Abrasion, left knee, initial encounter: Secondary | ICD-10-CM | POA: Diagnosis not present

## 2016-11-30 DIAGNOSIS — S6992XA Unspecified injury of left wrist, hand and finger(s), initial encounter: Secondary | ICD-10-CM | POA: Diagnosis not present

## 2016-11-30 DIAGNOSIS — M1612 Unilateral primary osteoarthritis, left hip: Secondary | ICD-10-CM | POA: Diagnosis not present

## 2016-11-30 DIAGNOSIS — Y990 Civilian activity done for income or pay: Secondary | ICD-10-CM | POA: Diagnosis not present

## 2016-12-27 ENCOUNTER — Other Ambulatory Visit: Payer: Self-pay

## 2016-12-27 ENCOUNTER — Encounter: Payer: Self-pay | Admitting: Family Medicine

## 2016-12-27 ENCOUNTER — Ambulatory Visit (INDEPENDENT_AMBULATORY_CARE_PROVIDER_SITE_OTHER): Payer: Medicare Other | Admitting: Family Medicine

## 2016-12-27 VITALS — BP 122/70 | Wt 197.0 lb

## 2016-12-27 DIAGNOSIS — D6851 Activated protein C resistance: Secondary | ICD-10-CM | POA: Diagnosis not present

## 2016-12-27 DIAGNOSIS — I2510 Atherosclerotic heart disease of native coronary artery without angina pectoris: Secondary | ICD-10-CM

## 2016-12-27 DIAGNOSIS — K0889 Other specified disorders of teeth and supporting structures: Secondary | ICD-10-CM | POA: Diagnosis not present

## 2016-12-27 DIAGNOSIS — Z7901 Long term (current) use of anticoagulants: Secondary | ICD-10-CM

## 2016-12-27 LAB — PROTIME-INR
INR: 9.6
Prothrombin Time: 92 s — ABNORMAL HIGH (ref 9.0–11.5)

## 2016-12-27 MED ORDER — TRAMADOL HCL 50 MG PO TABS: 50.0000 mg | ORAL_TABLET | Freq: Three times a day (TID) | ORAL | 0 refills | Status: DC | PRN

## 2016-12-27 MED ORDER — AMOXICILLIN 875 MG PO TABS: 875.0000 mg | ORAL_TABLET | Freq: Two times a day (BID) | ORAL | 0 refills | Status: DC

## 2016-12-27 NOTE — Progress Notes (Addendum)
Subjective:    Patient ID: Perrion Neault, male    DOB: April 12, 1961, 56 y.o.   MRN: [id number]  HPI He has had difficulty with a toothache for the last several days. He cannot get in to see his dentist. He has been taking Tylenol for pain relief. He continues on Coumadin but has not checked his Coumadin levels in about 3 weeks due to work-related issues interfering with his testing.   Review of Systems     Objective:   Physical Exam Alert and in no distress. Exam of his mouth shows no erythema but does have tenderness to the tooth #12.       Assessment & Plan:  Factor V Leiden (HCC) - Plan: Protime-INR  Long term current use of anticoagulant therapy - Plan: Protime-INR  Toothache - Plan: traMADol (ULTRAM) 50 MG tablet, amoxicillin (AMOXIL) 875 MG tablet Cautioned him to check his PT/INR especially since he is now on two different medications which can interfere with his Coumadin. INR above 9. A message was left with both numbers advising to stop the coumadin and add greens regularly and if any bleeding, go tho the ER.Check the INR on Monday

## 2016-12-27 NOTE — Telephone Encounter (Signed)
Called in Tramadol per jcl 

## 2016-12-30 ENCOUNTER — Telehealth: Payer: Self-pay

## 2016-12-30 NOTE — Telephone Encounter (Signed)
Called pt per [facility] and he did get both message from Dr.Lalonde ( INR above 9. A message was left with both numbers advising to stop the coumadin and add greens regularly and if any bleeding, go tho the ER.Check the INR on Monday) He States he has ate greens all weekend and will test today

## 2016-12-31 ENCOUNTER — Telehealth: Payer: Self-pay | Admitting: Family Medicine

## 2016-12-31 NOTE — Telephone Encounter (Signed)
Have him go back to his previous Coumadin dosing and verify what it is in the record. Reinforced the fact for him to eat normal amounts of greens. He is to recheck his INR in 1 week and let us know

## 2016-12-31 NOTE — Telephone Encounter (Signed)
Pt calld with his INR reading from last night. It was 1.4. Pt can be reached at [phone] today

## 2016-12-31 NOTE — Telephone Encounter (Signed)
Pt informed he states he takes 12 mg QD he said he will call us back in 1 week

## 2017-01-13 ENCOUNTER — Ambulatory Visit (INDEPENDENT_AMBULATORY_CARE_PROVIDER_SITE_OTHER): Payer: Medicare Other | Admitting: Family Medicine

## 2017-01-13 ENCOUNTER — Telehealth: Payer: Self-pay

## 2017-01-13 VITALS — BP 130/80 | HR 60 | Resp 16 | Wt 200.6 lb

## 2017-01-13 DIAGNOSIS — D6851 Activated protein C resistance: Secondary | ICD-10-CM

## 2017-01-13 DIAGNOSIS — I2510 Atherosclerotic heart disease of native coronary artery without angina pectoris: Secondary | ICD-10-CM | POA: Diagnosis not present

## 2017-01-13 DIAGNOSIS — Z952 Presence of prosthetic heart valve: Secondary | ICD-10-CM | POA: Diagnosis not present

## 2017-01-13 DIAGNOSIS — Z7901 Long term (current) use of anticoagulants: Secondary | ICD-10-CM

## 2017-01-13 DIAGNOSIS — F32A Depression, unspecified: Secondary | ICD-10-CM

## 2017-01-13 DIAGNOSIS — J4521 Mild intermittent asthma with (acute) exacerbation: Secondary | ICD-10-CM | POA: Diagnosis not present

## 2017-01-13 DIAGNOSIS — F329 Major depressive disorder, single episode, unspecified: Secondary | ICD-10-CM | POA: Diagnosis not present

## 2017-01-13 LAB — PROTIME-INR
INR: 7.6 — ABNORMAL HIGH
Prothrombin Time: 73.8 s — ABNORMAL HIGH (ref 9.0–11.5)

## 2017-01-13 MED ORDER — MOMETASONE FUROATE 220 MCG/INH IN AEPB: 2.0000 | INHALATION_SPRAY | Freq: Every day | RESPIRATORY_TRACT | 12 refills | Status: DC

## 2017-01-13 NOTE — Telephone Encounter (Signed)
Pt needs PA on Asmanex.

## 2017-01-13 NOTE — Progress Notes (Signed)
Subjective:    Patient ID: Frederick Payne, male    DOB: 03/09/1961, 56 y.o.   MRN: [id number]  HPI Over the last 2 weeks he has noted difficulty with sneezing, itchy watery eyes, coughing and has been using Flonase as well as rescue inhaler. No fever, chills, sore throat. He also needs a PT/INR. He did have an elevated PT/INR and then held off on any Coumadin. His next PT/INR was subtherapeutic and he therefore started eating again. He is here for recheck. No excessive bleeding but did have one episode of slight nosebleed and he does have factor V Leiden as well as aortic valve replacement. Also of note is the fact he is no longer taking Trintellix. He thought that he could take it on an as-needed basis. He is also stopped going to counseling as the therapist make him feel uncomfortable discussing issues that he is not ready to deal with.  Review of Systems     Objective:   Physical Exam Alert and in no distress. Tympanic membranes and canals are normal. Pharyngeal area is normal. Neck is supple without adenopathy or thyromegaly. Cardiac exam shows a regular sinus rhythm without murmurs or gallops. Lungs show expiratory wheezing.        Assessment & Plan:  Mild intermittent extrinsic asthma with acute exacerbation - Plan: mometasone (ASMANEX 60 METERED DOSES) 220 MCG/INH inhaler  Factor V Leiden (HCC) - Plan: Protime-INR  Long term current use of anticoagulant therapy - Plan: Protime-INR  Depression, unspecified depression type  S/P AVR I explained that his coughing is more from allergy/asthma and I will place him on Asmanex which he has been on in the past. Since he is not taking the Trintellix regularly, we will hold off on that. Strongly encouraged him to get back involved in counseling and deal with the issues that make him feel uncomfortable to truly put them behind him. His INR did come back elevated. He will hold off on his Coumadin for 3 days and then start back again as well  as regular eating habits. He is to call me one week later with the repeat PT/INR.

## 2017-01-14 NOTE — Telephone Encounter (Signed)
Tell him that we have to use another product and call in the [place] . One puff twice a day

## 2017-01-14 NOTE — Telephone Encounter (Signed)
P.A. ASMANEX, [place] for P.A. And pt must try all 3 covered alternatives before Asmanex can be approved, choices are Arnuity ellipta, Flovent Diskus, Flovent HFA.  Can you switch pt to covered alternative?

## 2017-01-15 ENCOUNTER — Other Ambulatory Visit: Payer: Self-pay | Admitting: Physician Assistant

## 2017-01-15 MED ORDER — FLUTICASONE PROPIONATE HFA 220 MCG/ACT IN AERO: 1.0000 | INHALATION_SPRAY | Freq: Two times a day (BID) | RESPIRATORY_TRACT | 12 refills | Status: DC

## 2017-01-15 NOTE — Telephone Encounter (Signed)
Called in Flovent & cancelled the Asmanex.  Called pt & informed

## 2017-02-06 NOTE — Progress Notes (Signed)
Talk to him about his diet

## 2017-02-17 ENCOUNTER — Ambulatory Visit (INDEPENDENT_AMBULATORY_CARE_PROVIDER_SITE_OTHER): Payer: Medicare Other | Admitting: Family Medicine

## 2017-02-17 ENCOUNTER — Ambulatory Visit
Admission: RE | Admit: 2017-02-17 | Discharge: 2017-02-17 | Disposition: A | Discharge status: Home / Self Care | Payer: Medicare Other | Source: Ambulatory Visit | Attending: Family Medicine | Admitting: Family Medicine

## 2017-02-17 ENCOUNTER — Encounter: Payer: Self-pay | Admitting: Family Medicine

## 2017-02-17 VITALS — BP 124/80 | HR 50 | Wt 201.0 lb

## 2017-02-17 DIAGNOSIS — M549 Dorsalgia, unspecified: Secondary | ICD-10-CM

## 2017-02-17 DIAGNOSIS — I2510 Atherosclerotic heart disease of native coronary artery without angina pectoris: Secondary | ICD-10-CM | POA: Diagnosis not present

## 2017-02-17 DIAGNOSIS — D6851 Activated protein C resistance: Secondary | ICD-10-CM

## 2017-02-17 DIAGNOSIS — Z7901 Long term (current) use of anticoagulants: Secondary | ICD-10-CM

## 2017-02-17 DIAGNOSIS — S299XXA Unspecified injury of thorax, initial encounter: Secondary | ICD-10-CM | POA: Diagnosis not present

## 2017-02-17 LAB — PROTIME-INR
INR: 5.5 — ABNORMAL HIGH
Prothrombin Time: 54.4 s — ABNORMAL HIGH (ref 9.0–11.5)

## 2017-02-17 MED ORDER — HYDROCODONE-ACETAMINOPHEN 5-325 MG PO TABS: 1.0000 | ORAL_TABLET | Freq: Four times a day (QID) | ORAL | 0 refills | Status: DC | PRN

## 2017-02-17 NOTE — Progress Notes (Addendum)
Subjective:    Patient ID: Frederick Payne, male    DOB: June 14, 1961, 56 y.o.   MRN: [id number]  HPI 5 days ago while helping an elderly lady get out of bed, she slipped and he caught her sustaining mid back discomfort. Since then the back pain is gotten worse and is now pleuritic in nature. He continues on Coumadin. His last PT/INR was 3.8 approximately one week ago. He was instructed to get more greens. He has had no difficulty with bleeding or bruising from the mouth lips or stool.   Review of Systems     Objective:   Physical Exam  Alert and in obvious discomfort. Palpable tenderness in the mid back area bilaterally. Lungs are clear to auscultation. No skin changes noted. Cardiac exam shows a loud S2 with a normal rhythm.       Assessment & Plan:  Factor V Leiden (HCC) - Plan: Protime-INR  Long term current use of anticoagulant therapy - Plan: Protime-INR  Mid back pain - Plan: DG Chest 2 View, HYDROcodone-acetaminophen (NORCO) 5-325 MG tablet I suspect he has a back strain but with his pleuritic type pain, I will get an x-ray as well and will also do a stat PT/INR. Codeine given since he has been using ibuprofen. I warned him against using ibuprofen in the future.  Chest x-ray was negative. His PT/INR was 5.5. I relayed this information to him. He will hold off on Coumadin for 2 days in and make sure he eats adequate amounts of greens. Explained that his pain is probably due to muscle strain and subsequent bleeding because of his high INR. He is to recheck his PT/INR at home in one week and call me

## 2017-02-20 ENCOUNTER — Ambulatory Visit (INDEPENDENT_AMBULATORY_CARE_PROVIDER_SITE_OTHER): Payer: Medicare Other | Admitting: Neurology

## 2017-02-20 ENCOUNTER — Encounter: Payer: Self-pay | Admitting: Neurology

## 2017-02-20 VITALS — BP 103/69 | HR 53 | Ht 68.0 in | Wt 199.0 lb

## 2017-02-20 DIAGNOSIS — R350 Frequency of micturition: Secondary | ICD-10-CM

## 2017-02-20 DIAGNOSIS — G90522 Complex regional pain syndrome I of left lower limb: Secondary | ICD-10-CM

## 2017-02-20 DIAGNOSIS — I2510 Atherosclerotic heart disease of native coronary artery without angina pectoris: Secondary | ICD-10-CM | POA: Diagnosis not present

## 2017-02-20 NOTE — Patient Instructions (Signed)
   We will refer to a urologist, try to get another appointment with Dr. Nicholaus Bloom

## 2017-02-20 NOTE — Progress Notes (Signed)
Reason for visit: Left leg pain  Frederick Payne is an 56 y.o. male  History of present illness:  Frederick Payne is a 56 year old right-handed white male with a history of what is felt to be reflex sympathetic dystrophy of the left leg. The patient has swelling in the leg as the day goes on. The patient is hypersensitive to light touch on the shin and foot. He is on no medications currently for the pain. The patient has difficulty sleeping at night mainly because of urinary frequency. He indicates that he gets up every hour or hour and a half because of the need to urinate. The patient was set up to see Dr. Nicholaus Bloom for evaluation for the left leg pain. Unfortunately, he missed the appointment as he forgot about the appointment date. He will call again to try to get another appointment. The patient returns for an evaluation.  Past Medical History:  Diagnosis Date  . Aortic stenosis    Had surgery performed  . CAD (coronary artery disease)   . Chronic anemia   . Deep vein thrombosis ([place]) 2006   After a broken ankle--recently diagnosed with reflex sympathetic dystrophy of the left lower extremity  . Elevated LFTs    h/o excessive alcohol  . Factor V Leiden ([place])   . Hypertension   . Ischemic heart disease   . Psychiatric disorder    Underlying  . PUD (peptic ulcer disease)    with bleeding  . Reflex sympathetic dystrophy     Past Surgical History:  Procedure Laterality Date  . AORTIC VALVE REPLACEMENT  07/15/12   27 mm St Jude  . CORONARY ARTERY BYPASS GRAFT     x1  . GALLBLADDER SURGERY  2008  . gastrectomy and vagotomy    . greenfield filter      Family History  Problem Relation Age of Onset  . Healthy Father   . Stroke Paternal Grandmother     Social history:  reports that he has quit smoking. He has never used smokeless tobacco. He reports that he does not drink alcohol or use drugs.    Allergies  Allergen Reactions  . Benzodiazepines Other (See Comments)   Causes pt to get violent   . Contrast Media [Iodinated Diagnostic Agents] Hives  . Iodine Anaphylaxis  . Other Anaphylaxis    FRUITS FROM TREES.   . Pectin Anaphylaxis  . Shellfish Allergy Anaphylaxis  . Sulfa Antibiotics Anaphylaxis  . Diazepam Other (See Comments)    Headache, strange behavior, memory loss  . Lactose Intolerance (Gi) Nausea And Vomiting    Medications:  Prior to Admission medications   Medication Sig Start Date End Date Taking? Authorizing Provider  ascorbic acid (VITAMIN C) 500 MG tablet Take 500 mg by mouth daily.   Yes Historical Provider, MD  aspirin 81 MG tablet Take 81 mg by mouth daily.   Yes Historical Provider, MD  CALCIUM PO Take 1 tablet by mouth daily.    Yes Historical Provider, MD  COAGUCHEK LANCETS MISC 1 each by Does not apply route as directed. 02/14/16  Yes Brunetta Genera, MD  Copper Gluconate (COPPER CAPS) 2 MG CAPS Take by mouth every evening.   Yes Historical Provider, MD  ferrous sulfate 325 (65 FE) MG tablet TAKE 1 TABLET BY MOUTH EVERY DAY 11/20/16  Yes Denita Lung, MD  fish oil-omega-3 fatty acids 1000 MG capsule Take 1 g by mouth daily.   Yes Historical Provider, MD  Fluticasone Furoate (  ARNUITY ELLIPTA) 100 MCG/ACT AEPB Inhale 1 puff into the lungs daily. 02/15/16  Yes Denita Lung, MD  levalbuterol [facility] HFA) 45 MCG/ACT inhaler INHALE 1-2 PUFFS INTO THE LUNGS EVERY 4-6 HOURS FOR WHEEZING OR SHORTNESS OF BREATH 10/05/16  Yes Denita Lung, MD  metoprolol succinate (TOPROL-XL) 50 MG 24 hr tablet TAKE ONE-HALF TABLET BY MOUTH IN THE MORNING AND ONE TABLET IN THE EVENING 10/11/16  Yes Peter M Martinique, MD  Multiple Vitamins-Minerals (ONE-A-DAY MENS 50+ ADVANTAGE PO) Take by mouth daily.   Yes Historical Provider, MD  POLICOSANOL PO Take 20 mg by mouth daily.   Yes Historical Provider, MD  POLY-IRON 150 150 MG capsule TAKE 1 CAPSULE BY MOUTH EVERY DAY 01/15/16  Yes Denita Lung, MD  PT/INR Test [facility] PT TEST) STRP 1 each by In Vitro  route as directed. 02/14/16  Yes Brunetta Genera, MD  PT/INR Testing Monitor (COAGUCHEK XS PLUS SYSTEM) KIT 1 each by Does not apply route as directed. 02/14/16  Yes Brunetta Genera, MD  S-Adenosylmethionine (SAM-E) 400 MG TABS Take 1 tablet by mouth daily.   Yes Historical Provider, MD  [facility] Wort 300 MG CAPS Take 300 mg by mouth 2 (two) times daily.   Yes Historical Provider, MD  Thiamine HCl (B-1) 100 MG TABS Take by mouth.   Yes Historical Provider, MD  warfarin (COUMADIN) 10 MG tablet TAKE 1 TABLET BY MOUTH EVERY DAY 09/16/16  Yes Denita Lung, MD  warfarin (COUMADIN) 2 MG tablet TAKE 1 TABLET BY MOUTH DAILY 01/08/16  Yes Denita Lung, MD  warfarin (COUMADIN) 5 MG tablet Take 5 mg by mouth daily.   Yes Historical Provider, MD    ROS:  Out of a complete 14 system review of symptoms, the patient complains only of the following symptoms, and all other reviewed systems are negative.  Eye discharge, eye redness Cough Leg swelling Food allergies Frequency of urination Back pain, muscle cramps, walking difficulty  Blood pressure 103/69, pulse (!) 53, height '5\' 8"'$  (1.727 m), weight 199 lb (90.3 kg).  Physical Exam  General: The patient is alert and cooperative at the time of the examination.  Skin: No significant peripheral edema is noted on the right, 2+ edema on the left leg.   Neurologic Exam  Mental status: The patient is alert and oriented x 3 at the time of the examination. The patient has apparent normal recent and remote memory, with an apparently normal attention span and concentration ability.   Cranial nerves: Facial symmetry is present. Speech is normal, no aphasia or dysarthria is noted. Extraocular movements are full. Visual fields are full.  Motor: The patient has good strength in all 4 extremities.  Sensory examination: Soft touch sensation is symmetric on the face and arms, the patient has hypersensitivity to light touch on the left foot and  shin.  Coordination: The patient has good finger-nose-finger and heel-to-shin bilaterally.  Gait and station: The patient has a normal gait. Tandem gait is normal. Romberg is negative. No drift is seen.  Reflexes: Deep tendon reflexes are symmetric.   Assessment/Plan:  1. Left leg pain, reflex sympathetic dystrophy  2. Urinary frequency  The patient will be referred to a urologist for urinary frequency. He will try to get another appointment with Dr. Nicholaus Bloom. He will follow-up here in about 6 months.  Jill Alexanders MD 02/20/2017 11:11 AM  [facility] [address] [place], [place] [address]  Phone [phone]  Fax [phone]

## 2017-03-13 DIAGNOSIS — F4310 Post-traumatic stress disorder, unspecified: Secondary | ICD-10-CM | POA: Diagnosis not present

## 2017-04-01 ENCOUNTER — Other Ambulatory Visit: Payer: Self-pay | Admitting: Family Medicine

## 2017-04-23 DIAGNOSIS — F4310 Post-traumatic stress disorder, unspecified: Secondary | ICD-10-CM | POA: Diagnosis not present

## 2017-04-24 DIAGNOSIS — R072 Precordial pain: Secondary | ICD-10-CM | POA: Diagnosis not present

## 2017-04-24 DIAGNOSIS — D6851 Activated protein C resistance: Secondary | ICD-10-CM | POA: Diagnosis not present

## 2017-04-24 DIAGNOSIS — I10 Essential (primary) hypertension: Secondary | ICD-10-CM | POA: Diagnosis not present

## 2017-04-24 DIAGNOSIS — D500 Iron deficiency anemia secondary to blood loss (chronic): Secondary | ICD-10-CM | POA: Diagnosis not present

## 2017-04-24 DIAGNOSIS — R161 Splenomegaly, not elsewhere classified: Secondary | ICD-10-CM | POA: Diagnosis not present

## 2017-04-24 DIAGNOSIS — R110 Nausea: Secondary | ICD-10-CM | POA: Diagnosis not present

## 2017-04-24 DIAGNOSIS — D682 Hereditary deficiency of other clotting factors: Secondary | ICD-10-CM | POA: Diagnosis not present

## 2017-04-24 DIAGNOSIS — R61 Generalized hyperhidrosis: Secondary | ICD-10-CM | POA: Diagnosis not present

## 2017-04-24 DIAGNOSIS — R930 Abnormal findings on diagnostic imaging of skull and head, not elsewhere classified: Secondary | ICD-10-CM | POA: Diagnosis not present

## 2017-04-24 DIAGNOSIS — Z743 Need for continuous supervision: Secondary | ICD-10-CM | POA: Diagnosis not present

## 2017-04-24 DIAGNOSIS — G90522 Complex regional pain syndrome I of left lower limb: Secondary | ICD-10-CM | POA: Diagnosis not present

## 2017-04-24 DIAGNOSIS — I350 Nonrheumatic aortic (valve) stenosis: Secondary | ICD-10-CM | POA: Diagnosis not present

## 2017-04-24 DIAGNOSIS — R001 Bradycardia, unspecified: Secondary | ICD-10-CM | POA: Diagnosis not present

## 2017-04-24 DIAGNOSIS — J019 Acute sinusitis, unspecified: Secondary | ICD-10-CM | POA: Diagnosis not present

## 2017-04-24 DIAGNOSIS — Z86718 Personal history of other venous thrombosis and embolism: Secondary | ICD-10-CM | POA: Diagnosis not present

## 2017-04-24 DIAGNOSIS — J45909 Unspecified asthma, uncomplicated: Secondary | ICD-10-CM | POA: Diagnosis not present

## 2017-04-24 DIAGNOSIS — B9689 Other specified bacterial agents as the cause of diseases classified elsewhere: Secondary | ICD-10-CM | POA: Diagnosis not present

## 2017-04-24 DIAGNOSIS — R079 Chest pain, unspecified: Secondary | ICD-10-CM | POA: Diagnosis not present

## 2017-04-24 DIAGNOSIS — Z79899 Other long term (current) drug therapy: Secondary | ICD-10-CM | POA: Diagnosis not present

## 2017-04-24 DIAGNOSIS — K760 Fatty (change of) liver, not elsewhere classified: Secondary | ICD-10-CM | POA: Diagnosis not present

## 2017-04-24 DIAGNOSIS — R0789 Other chest pain: Secondary | ICD-10-CM | POA: Diagnosis not present

## 2017-04-24 DIAGNOSIS — J432 Centrilobular emphysema: Secondary | ICD-10-CM | POA: Diagnosis not present

## 2017-04-24 DIAGNOSIS — I498 Other specified cardiac arrhythmias: Secondary | ICD-10-CM | POA: Diagnosis not present

## 2017-04-24 DIAGNOSIS — K439 Ventral hernia without obstruction or gangrene: Secondary | ICD-10-CM | POA: Diagnosis not present

## 2017-04-24 DIAGNOSIS — Z87891 Personal history of nicotine dependence: Secondary | ICD-10-CM | POA: Diagnosis not present

## 2017-04-24 DIAGNOSIS — Z95828 Presence of other vascular implants and grafts: Secondary | ICD-10-CM | POA: Diagnosis not present

## 2017-04-24 DIAGNOSIS — I4892 Unspecified atrial flutter: Secondary | ICD-10-CM | POA: Diagnosis not present

## 2017-04-24 DIAGNOSIS — R51 Headache: Secondary | ICD-10-CM | POA: Diagnosis not present

## 2017-04-24 DIAGNOSIS — R5383 Other fatigue: Secondary | ICD-10-CM | POA: Diagnosis not present

## 2017-04-24 DIAGNOSIS — Z7952 Long term (current) use of systemic steroids: Secondary | ICD-10-CM | POA: Diagnosis not present

## 2017-04-24 DIAGNOSIS — I2510 Atherosclerotic heart disease of native coronary artery without angina pectoris: Secondary | ICD-10-CM | POA: Diagnosis not present

## 2017-04-24 DIAGNOSIS — Z9049 Acquired absence of other specified parts of digestive tract: Secondary | ICD-10-CM | POA: Diagnosis not present

## 2017-04-24 DIAGNOSIS — Z954 Presence of other heart-valve replacement: Secondary | ICD-10-CM | POA: Diagnosis not present

## 2017-04-24 DIAGNOSIS — Z952 Presence of prosthetic heart valve: Secondary | ICD-10-CM | POA: Diagnosis not present

## 2017-04-24 DIAGNOSIS — M549 Dorsalgia, unspecified: Secondary | ICD-10-CM | POA: Diagnosis not present

## 2017-04-24 DIAGNOSIS — R911 Solitary pulmonary nodule: Secondary | ICD-10-CM | POA: Diagnosis not present

## 2017-04-24 DIAGNOSIS — Z951 Presence of aortocoronary bypass graft: Secondary | ICD-10-CM | POA: Diagnosis not present

## 2017-04-24 DIAGNOSIS — E041 Nontoxic single thyroid nodule: Secondary | ICD-10-CM | POA: Diagnosis not present

## 2017-04-24 DIAGNOSIS — R1013 Epigastric pain: Secondary | ICD-10-CM | POA: Diagnosis not present

## 2017-04-24 DIAGNOSIS — Z87892 Personal history of anaphylaxis: Secondary | ICD-10-CM | POA: Diagnosis not present

## 2017-04-24 DIAGNOSIS — Z888 Allergy status to other drugs, medicaments and biological substances status: Secondary | ICD-10-CM | POA: Diagnosis not present

## 2017-04-24 DIAGNOSIS — G9389 Other specified disorders of brain: Secondary | ICD-10-CM | POA: Diagnosis not present

## 2017-04-24 DIAGNOSIS — Z7901 Long term (current) use of anticoagulants: Secondary | ICD-10-CM | POA: Diagnosis not present

## 2017-04-24 DIAGNOSIS — Z91041 Radiographic dye allergy status: Secondary | ICD-10-CM | POA: Diagnosis not present

## 2017-04-24 DIAGNOSIS — Z903 Acquired absence of stomach [part of]: Secondary | ICD-10-CM | POA: Diagnosis not present

## 2017-04-25 DIAGNOSIS — Z5181 Encounter for therapeutic drug level monitoring: Secondary | ICD-10-CM | POA: Diagnosis not present

## 2017-04-25 DIAGNOSIS — J019 Acute sinusitis, unspecified: Secondary | ICD-10-CM | POA: Diagnosis not present

## 2017-04-25 DIAGNOSIS — D500 Iron deficiency anemia secondary to blood loss (chronic): Secondary | ICD-10-CM | POA: Diagnosis not present

## 2017-04-25 DIAGNOSIS — R0789 Other chest pain: Secondary | ICD-10-CM | POA: Diagnosis not present

## 2017-04-25 DIAGNOSIS — I2510 Atherosclerotic heart disease of native coronary artery without angina pectoris: Secondary | ICD-10-CM | POA: Diagnosis not present

## 2017-04-25 DIAGNOSIS — Z7901 Long term (current) use of anticoagulants: Secondary | ICD-10-CM | POA: Diagnosis not present

## 2017-04-25 DIAGNOSIS — B9689 Other specified bacterial agents as the cause of diseases classified elsewhere: Secondary | ICD-10-CM | POA: Diagnosis not present

## 2017-04-25 DIAGNOSIS — D682 Hereditary deficiency of other clotting factors: Secondary | ICD-10-CM | POA: Diagnosis not present

## 2017-04-25 DIAGNOSIS — R51 Headache: Secondary | ICD-10-CM | POA: Diagnosis not present

## 2017-04-26 DIAGNOSIS — Z7901 Long term (current) use of anticoagulants: Secondary | ICD-10-CM | POA: Diagnosis not present

## 2017-04-26 DIAGNOSIS — B9689 Other specified bacterial agents as the cause of diseases classified elsewhere: Secondary | ICD-10-CM | POA: Diagnosis not present

## 2017-04-26 DIAGNOSIS — R079 Chest pain, unspecified: Secondary | ICD-10-CM | POA: Diagnosis not present

## 2017-04-26 DIAGNOSIS — D682 Hereditary deficiency of other clotting factors: Secondary | ICD-10-CM | POA: Diagnosis not present

## 2017-04-26 DIAGNOSIS — D500 Iron deficiency anemia secondary to blood loss (chronic): Secondary | ICD-10-CM | POA: Diagnosis not present

## 2017-04-26 DIAGNOSIS — R0789 Other chest pain: Secondary | ICD-10-CM | POA: Diagnosis not present

## 2017-04-26 DIAGNOSIS — R51 Headache: Secondary | ICD-10-CM | POA: Diagnosis not present

## 2017-04-26 DIAGNOSIS — Z86718 Personal history of other venous thrombosis and embolism: Secondary | ICD-10-CM | POA: Diagnosis not present

## 2017-04-26 DIAGNOSIS — Z5181 Encounter for therapeutic drug level monitoring: Secondary | ICD-10-CM | POA: Diagnosis not present

## 2017-04-26 DIAGNOSIS — J019 Acute sinusitis, unspecified: Secondary | ICD-10-CM | POA: Diagnosis not present

## 2017-04-26 DIAGNOSIS — I2510 Atherosclerotic heart disease of native coronary artery without angina pectoris: Secondary | ICD-10-CM | POA: Diagnosis not present

## 2017-04-29 ENCOUNTER — Encounter: Payer: Self-pay | Admitting: Family Medicine

## 2017-04-29 ENCOUNTER — Ambulatory Visit (INDEPENDENT_AMBULATORY_CARE_PROVIDER_SITE_OTHER): Payer: Medicare Other | Admitting: Family Medicine

## 2017-04-29 VITALS — BP 110/76 | HR 48 | Wt 200.0 lb

## 2017-04-29 DIAGNOSIS — E041 Nontoxic single thyroid nodule: Secondary | ICD-10-CM

## 2017-04-29 DIAGNOSIS — D6851 Activated protein C resistance: Secondary | ICD-10-CM | POA: Diagnosis not present

## 2017-04-29 DIAGNOSIS — I2510 Atherosclerotic heart disease of native coronary artery without angina pectoris: Secondary | ICD-10-CM | POA: Diagnosis not present

## 2017-04-29 DIAGNOSIS — Z7901 Long term (current) use of anticoagulants: Secondary | ICD-10-CM

## 2017-04-29 DIAGNOSIS — J329 Chronic sinusitis, unspecified: Secondary | ICD-10-CM | POA: Diagnosis not present

## 2017-04-29 LAB — TSH: TSH: 2.1 mIU/L (ref 0.40–4.50)

## 2017-04-29 NOTE — Progress Notes (Signed)
Subjective:    Patient ID: Frederick Payne, male    DOB: Apr 22, 1961, 56 y.o.   MRN: [id number]  HPI He is here for follow-up visit after recent hospitalization for evaluation of headache as well as chest pain. [facility] The hospital record including discharge summary, lab and x-rays was reviewed. His evaluation for heart disease was negative. He did have an MRI of his brain which is also negative but did show some evidence of sinus disease. He was placed on Augmentin and states his headache is doing much better. Presently he is having no chest pain, shortness of breath. His PT/INR in the hospital was slightly above 4 and the last one was at 2.1. He is now back on 10 mg of Coumadin and will plan to check his PT/INR at home on Monday. He does have a mechanical valve and also factor V Leiden. Also while in hospital he was noted to have a thyroid nodule that he needs follow-up with. He's had no chest pain, hot or cold intolerance, tremors.   Review of Systems     Objective:   Physical Exam Alert and in no distress. Cardiac exam does show a loud S2. Lungs are clear to auscultation. Neck exam shows no thyromegaly or lymphadenopathy. DTRs normal. Skin normal. Hospital record including discharge summary, lab and x-rays were reviewed.     Assessment & Plan:  Thyroid nodule  Sinusitis, unspecified chronicity, unspecified location  Long term current use of anticoagulant therapy  Factor V Leiden (HCC) Encouraged him to continue on his present Coumadin dosing as well as eating habits in regard to greens. He is to call me Monday to let me know what his PT/INR is.

## 2017-05-01 ENCOUNTER — Telehealth: Payer: Self-pay | Admitting: Family Medicine

## 2017-05-01 NOTE — Telephone Encounter (Signed)
Pt informed and verbalized understanding

## 2017-05-01 NOTE — Telephone Encounter (Signed)
Pt wanted to report that he had an incident of the same chest pain that he had recently that sent him to the hospital except today the pain only lasted for 10 minutes. The pain seemed to hurt worse in his back first then radiated to his chest. Pt thought he was going to have to go the hospital again because the pain was bad but it did go away. He is tired and lethargic now . He noticed the tired and lethargic symptoms last night as well.

## 2017-05-01 NOTE — Telephone Encounter (Signed)
Have him start on Prilosec a total of 40 mg for the next week and see what that will do. He also should be checking his PT/INR and let us know what it is

## 2017-05-09 ENCOUNTER — Ambulatory Visit
Admission: RE | Admit: 2017-05-09 | Discharge: 2017-05-09 | Disposition: A | Discharge status: Home / Self Care | Payer: Medicare Other | Source: Ambulatory Visit | Attending: Family Medicine | Admitting: Family Medicine

## 2017-05-09 DIAGNOSIS — E041 Nontoxic single thyroid nodule: Secondary | ICD-10-CM | POA: Diagnosis not present

## 2017-05-21 ENCOUNTER — Ambulatory Visit (INDEPENDENT_AMBULATORY_CARE_PROVIDER_SITE_OTHER): Payer: Medicare Other | Admitting: Family Medicine

## 2017-05-21 DIAGNOSIS — Z7901 Long term (current) use of anticoagulants: Secondary | ICD-10-CM

## 2017-05-21 DIAGNOSIS — I2510 Atherosclerotic heart disease of native coronary artery without angina pectoris: Secondary | ICD-10-CM | POA: Diagnosis not present

## 2017-05-21 LAB — PROTIME-INR
INR: 5.5 — ABNORMAL HIGH
Prothrombin Time: 57 s — ABNORMAL HIGH (ref 9.0–11.5)

## 2017-05-21 NOTE — Progress Notes (Signed)
He came in for a INR. It was elevated. He'll be told to hold the Coumadin for 2 days and then make sure he is taking appropriate amounts of greens. Recheck in one week

## 2017-05-21 NOTE — Patient Instructions (Addendum)
Hold Coumadin for 2 days, ensure proper greens eating and recheck 1 week

## 2017-05-22 ENCOUNTER — Other Ambulatory Visit: Payer: Self-pay

## 2017-05-22 DIAGNOSIS — R791 Abnormal coagulation profile: Secondary | ICD-10-CM

## 2017-05-26 ENCOUNTER — Other Ambulatory Visit: Payer: Self-pay

## 2017-05-26 MED ORDER — METOPROLOL SUCCINATE ER 50 MG PO TB24: ORAL_TABLET | ORAL | 2 refills | Status: DC

## 2017-05-29 ENCOUNTER — Other Ambulatory Visit: Payer: Self-pay

## 2017-06-04 DIAGNOSIS — F4310 Post-traumatic stress disorder, unspecified: Secondary | ICD-10-CM | POA: Diagnosis not present

## 2017-06-17 DIAGNOSIS — F4310 Post-traumatic stress disorder, unspecified: Secondary | ICD-10-CM | POA: Diagnosis not present

## 2017-07-07 DIAGNOSIS — F4310 Post-traumatic stress disorder, unspecified: Secondary | ICD-10-CM | POA: Diagnosis not present

## 2017-07-16 ENCOUNTER — Telehealth: Payer: Self-pay

## 2017-07-16 ENCOUNTER — Encounter: Payer: Self-pay | Admitting: Family Medicine

## 2017-07-16 ENCOUNTER — Ambulatory Visit (INDEPENDENT_AMBULATORY_CARE_PROVIDER_SITE_OTHER): Payer: Medicare Other | Admitting: Family Medicine

## 2017-07-16 VITALS — BP 120/80 | HR 73 | Resp 18 | Wt 203.4 lb

## 2017-07-16 DIAGNOSIS — Z7901 Long term (current) use of anticoagulants: Secondary | ICD-10-CM | POA: Diagnosis not present

## 2017-07-16 DIAGNOSIS — Z23 Encounter for immunization: Secondary | ICD-10-CM

## 2017-07-16 DIAGNOSIS — H6190 Disorder of external ear, unspecified, unspecified ear: Secondary | ICD-10-CM | POA: Diagnosis not present

## 2017-07-16 DIAGNOSIS — H6192 Disorder of left external ear, unspecified: Secondary | ICD-10-CM | POA: Diagnosis not present

## 2017-07-16 DIAGNOSIS — I2510 Atherosclerotic heart disease of native coronary artery without angina pectoris: Secondary | ICD-10-CM

## 2017-07-16 LAB — PROTIME-INR
INR: 5.3 — ABNORMAL HIGH
Prothrombin Time: 55.5 s — ABNORMAL HIGH (ref 9.0–11.5)

## 2017-07-16 NOTE — Telephone Encounter (Signed)
Pt made aware of elevated INR- advised pt per JCL to hold Coumadin for 2 days and watch his greens. Pt aware and agreeable. Frederick Payne

## 2017-07-16 NOTE — Progress Notes (Signed)
Subjective:    Patient ID: Frederick Payne, male    DOB: 07-25-1961, 56 y.o.   MRN: [id number]  HPI He is here for evaluation of easy bruising. His last PT/INR was 3.1 on Sunday. He states that he has not changed his eating habits or medications. He also has a lesion on his left ear lobe and one on his left lateral calf that he would like evaluated.   Review of Systems     Objective:   Physical Exam Alert and in no distress. Some bruising is noted. Left ear lobe does show a lesion present on the pinna that is erythematous and about one meter in size. Left lateral calf does show a 1 cm tender slightly raised lesion.       Assessment & Plan:  Need for influenza vaccination - Plan: Flu Vaccine QUAD 6+ mos PF IM (Fluarix Quad PF)  Earlobe lesion, left - Plan: Ambulatory referral to Dermatology  Long term current use of anticoagulant therapy - Plan: Protime-INR  The earlobe lesion could easily be a BCE. The lesion on his leg is probably a foreign body but I will refer to dermatology to evaluate this further. PT/INR did come back elevated above 5. He is to hold his Coumadin for 2 days and insure that he is eating his normal amounts of greens and recheck this in approximately one week. He does have a machine to do that at home.

## 2017-08-04 DIAGNOSIS — F4310 Post-traumatic stress disorder, unspecified: Secondary | ICD-10-CM | POA: Diagnosis not present

## 2017-08-22 ENCOUNTER — Encounter: Payer: Self-pay | Admitting: Cardiology

## 2017-08-22 ENCOUNTER — Other Ambulatory Visit: Payer: Self-pay | Admitting: Family Medicine

## 2017-08-22 ENCOUNTER — Ambulatory Visit (INDEPENDENT_AMBULATORY_CARE_PROVIDER_SITE_OTHER): Payer: Medicare Other | Admitting: Cardiology

## 2017-08-22 VITALS — BP 118/80 | HR 60 | Ht 68.5 in | Wt 202.4 lb

## 2017-08-22 DIAGNOSIS — I2510 Atherosclerotic heart disease of native coronary artery without angina pectoris: Secondary | ICD-10-CM

## 2017-08-22 DIAGNOSIS — Z952 Presence of prosthetic heart valve: Secondary | ICD-10-CM | POA: Diagnosis not present

## 2017-08-22 DIAGNOSIS — I493 Ventricular premature depolarization: Secondary | ICD-10-CM | POA: Diagnosis not present

## 2017-08-22 DIAGNOSIS — I4892 Unspecified atrial flutter: Secondary | ICD-10-CM | POA: Diagnosis not present

## 2017-08-22 MED ORDER — METOPROLOL SUCCINATE ER 50 MG PO TB24: 50.0000 mg | ORAL_TABLET | Freq: Every day | ORAL | 3 refills | Status: DC

## 2017-08-22 MED ORDER — METOPROLOL SUCCINATE ER 25 MG PO TB24: 25.0000 mg | ORAL_TABLET | Freq: Every day | ORAL | 3 refills | Status: DC

## 2017-08-22 NOTE — Telephone Encounter (Signed)
Is this okay to refill? 

## 2017-08-22 NOTE — Progress Notes (Signed)
Cardiology Office Note    Date:  08/22/2017   ID:  Edi Heitzmann, DOB [birth year], MRN [id number]  PCP:  Ronnald Nian, MD  Cardiologist:  Elton Heid [place], MD    History of Present Illness:  Frederick Payne is a 56 y.o. male seen for follow s/p AVR with Bentall procedure. He also has a history of atrial flutter. He has a history of Leiden 5 deficiency and has had problems with hypercoagulability in the past.  He is on long-term Coumadin managed by Dr. Susann Givens.  He has had a previous blood clot to his left leg which caused him to have an ischemic neuropathy and he has leg weakness and walks with a limp.  He states that he has had 3 separate episodes of pulmonary emboli.  He has a inferior vena cava filter in place now.  He has a history of ischemic heart disease and valvular heart disease.  In July 2014 he underwent aortic valve replacement with a St. Jude mechanical valve.  He also had single-vessel coronary artery bypass graft surgery. In April 2017 he was admitted to [facility] with typical atrial flutter and was cardioverted. He thinks he was binge drinking at the time. Cardiac cath done at that time and showed nonobstructive CAD. He has since been seen in our Afib clinic with palpitations. 48 hr. Holter monitor showed PACs and PVCs (burden 2.6%) but no Afib or flutter. He also had a 10 day event monitor in Jan 2017 that only showed a single PAC.  He was admitted to [facility] in August 2017 with a seizure. He had severe hyponatremia, acidosis and elevated CK. This resolved with IVF. He was also drinking heavily at that time. He has since stopped drinking alcohol.   He presented on 04/24/17 to [facility] ED with complaints of chest pain. Initial evaluation with cardiac enzymes, CXR and Ecg unremarkable. He was transferred to [facility] and ruled out for MI. Myoview study was negative for ischemia. CT abd/pelvis showed no compromise of IVC filter. He complained of HA felt to be  related to sinusitis. MRI showed chronic small vessel ischemic changes.   On follow up today he has no further chest pain. He still has fluttering in his chest sometimes when lying down at night. More noticeable when stressed. Lasts < 20 minutes.  Past Medical History:  Diagnosis Date  . Aortic stenosis    Had surgery performed  . CAD (coronary artery disease)   . Chronic anemia   . Deep vein thrombosis ([facility]) 2006   After a broken ankle--recently diagnosed with reflex sympathetic dystrophy of the left lower extremity  . Elevated LFTs    h/o excessive alcohol  . Factor V Leiden (HCC)   . Hypertension   . Ischemic heart disease   . Psychiatric disorder    Underlying  . PUD (peptic ulcer disease)    with bleeding  . Reflex sympathetic dystrophy     Past Surgical History:  Procedure Laterality Date  . AORTIC VALVE REPLACEMENT  07/15/12   27 mm St Jude  . CORONARY ARTERY BYPASS GRAFT     x1  . GALLBLADDER SURGERY  2008  . gastrectomy and vagotomy    . greenfield filter      Current Medications: Outpatient Medications Prior to Visit  Medication Sig Dispense Refill  . ascorbic acid (VITAMIN C) 500 MG tablet Take 500 mg by mouth daily.    [name] aspirin 81 MG tablet Take 81 mg by mouth  daily.    Dewaine Conger LANCETS MISC 1 each by Does not apply route as directed. 200 each 1  . Copper Gluconate (COPPER CAPS) 2 MG CAPS Take by mouth every evening.    . ferrous sulfate 325 (65 FE) MG tablet TAKE 1 TABLET BY MOUTH EVERY DAY 90 tablet 1  . fish oil-omega-3 fatty acids 1000 MG capsule Take 1 g by mouth daily.    . Fluticasone Furoate (ARNUITY ELLIPTA) 100 MCG/ACT AEPB Inhale 1 puff into the lungs daily. 30 each 11  . levalbuterol (XOPENEX HFA) 45 MCG/ACT inhaler INHALE 1-2 PUFFS INTO THE LUNGS EVERY 4-6 HOURS FOR WHEEZING OR SHORTNESS OF BREATH 15 g 0  . Multiple Vitamins-Minerals (ONE-A-DAY MENS 50+ ADVANTAGE PO) Take by mouth daily.    [name] POLICOSANOL PO Take 20 mg by mouth daily.    [name]  POLY-IRON 150 150 MG capsule TAKE 1 CAPSULE BY MOUTH EVERY DAY 90 capsule 1  . PT/INR Test (COAGUCHEK PT TEST) STRP 1 each by In Vitro route as directed. 48 each 1  . PT/INR Testing Monitor (COAGUCHEK XS PLUS SYSTEM) KIT 1 each by Does not apply route as directed. 1 kit 0  . S-Adenosylmethionine (SAM-E) 400 MG TABS Take 1 tablet by mouth daily.    . [facility] Johns Wort 300 MG CAPS Take 300 mg by mouth 2 (two) times daily.    . Thiamine HCl (B-1) 100 MG TABS Take by mouth.    . warfarin (COUMADIN) 10 MG tablet TAKE 1 TABLET BY MOUTH EVERY DAY 90 tablet 1  . warfarin (COUMADIN) 2 MG tablet TAKE 1 TABLET BY MOUTH DAILY 30 tablet 2  . warfarin (COUMADIN) 5 MG tablet Take 5 mg by mouth daily.    . metoprolol succinate (TOPROL-XL) 50 MG 24 hr tablet TAKE ONE-HALF TABLET BY MOUTH IN THE MORNING AND ONE TABLET IN THE EVENING 90 tablet 2  . CALCIUM PO Take 1 tablet by mouth daily.      No facility-administered medications prior to visit.      Allergies:   Benzodiazepines; Contrast media [iodinated diagnostic agents]; Iodine; Other; Pectin; Shellfish allergy; Sulfa antibiotics; Diazepam; and Lactose intolerance (gi)   Social History   Social History  . Marital status: Married    Spouse name: N/A  . Number of children: 2  . Years of education: N/A   Occupational History  . finance-disabled    Social History Main Topics  . Smoking status: Former Games developer  . Smokeless tobacco: Never Used     Comment: quit 15 + yrs ago  . Alcohol use No  . Drug use: No  . Sexual activity: Yes    Partners: Male   Other Topics Concern  . None   Social History Narrative   Lives at home w/ his partner, Susann Givens and his elderly mother   Right-handed   Drinks about 3 cups of tea per day     Family History:  The patient's family history includes Healthy in his father; Stroke in his paternal grandmother.   ROS:   Please see the history of present illness.    ROS All other systems reviewed and are  negative.   PHYSICAL EXAM:   VS:  BP 118/80   Pulse 60   Ht 5' 8.5" (1.74 m)   Wt 202 lb 6.4 oz (91.8 kg)   BMI 30.33 kg/m    GENERAL:  Well appearing WM in NAD HEENT:  PERRL, EOMI, sclera are clear. Oropharynx is clear. NECK:  No jugular venous distention, carotid upstroke brisk and symmetric, no bruits, no thyromegaly or adenopathy LUNGS:  Clear to auscultation bilaterally CHEST:  Unremarkable HEART:  RRR,  PMI not displaced or sustained,S1 and S2 within normal limits, no S3, no S4: no clicks, no rubs, no murmurs ABD:  Soft, nontender. BS +, no masses or bruits. No hepatomegaly, no splenomegaly EXT:  2 + pulses throughout, no edema, no cyanosis no clubbing SKIN:  Warm and dry.  No rashes NEURO:  Alert and oriented x 3. Cranial nerves II through XII intact. PSYCH:  Cognitively intact    Wt Readings from Last 3 Encounters:  08/22/17 202 lb 6.4 oz (91.8 kg)  07/16/17 203 lb 6.4 oz (92.3 kg)  04/29/17 200 lb (90.7 kg)      Studies/Labs Reviewed:   EKG:  EKG is not ordered today.  The ekg ordered today demonstrates N/A  Recent Labs: 10/15/2016: Hemoglobin 14.4; Platelets 150 04/29/2017: TSH 2.10   Lipid Panel No results found for: CHOL, TRIG, HDL, CHOLHDL, VLDL, LDLCALC, LDLDIRECT  Labs dated 730/18: Hgb 15.8, plts 111K. Normal BMET. Cholesterol 179, triglycerides 172, HDL 30,LDL 115.  Additional studies/ records that were reviewed today include:  Holter monitor 05/02/16: Study Highlights    Normal sinus rhythm  PVCs- frequent with some trigeminy and couplets.  Very rare PACs.   Echo 11/13/15: Study Conclusions  - Left ventricle: The cavity size was normal. Wall thickness was   normal. Systolic function was normal. The estimated ejection   fraction was in the range of 55% to 60%. - Aortic valve: Normal appearing mechanical AVR with stable trivial   peri valvular regurgitation. Valve area (VTI): 1.74 cm^2. Valve   area (Vmax): 1.8 cm^2. Valve area (Vmean): 1.71  cm^2. - Left atrium: The atrium was mildly dilated. - Atrial septum: No defect or patent foramen ovale was identified.   Cardiac cath 01/26/16:RESULTS:   Coronary arteriograms and bypass graft angiogram 1. Left main- normal 2. Left anterior descending- 30% proximal; D1- normal 3. Left circumflex- 20% mid; OM1- 20% proximal 4. Right coronary- dominant vessel which is normal 5. Left internal mammary artery bypass graft LAD is widely patent.   CONCLUSIONS:  1. Two-vessel nonobstructive native coronary disease. 2. LIMA to LAD is widely patent. 3. Since she mechanical valve demonstrates normal opening under fluoroscopy.   Myoview 04/26/17:  IMPRESSION: Ejection fraction calculates to 60%. No ischemia  Result Narrative  INDICATION: chest pain  TECHNIQUE: NM HEART SPECT PH STRESS (R). Comparison none. 13 mCi technetium Cardiolite administered for the rest portion of the study and 36 mCi for the stress portion of the study. SPECT images were obtained.  FINDINGS: Ejection fraction calculates to 60%. End-diastolic volume is 78 mL and systolic volume is 31 mL. Normal wall motion. No ischemia     ASSESSMENT:    1. Coronary artery disease involving native coronary artery of native heart without angina pectoris   2. Atrial flutter with rapid ventricular response (HCC)   3. S/P AVR   4. Frequent PVCs      PLAN:   1. Patient's arrhythmias have improved significantly since cessation of Etoh consumption. Will continue Toprol XL 75 mg daily. Encourage Etoh avoidance 2.  Cardiac cath in April 2017 showed nonobstructive CAD. More recent Myoview in June this year showed no ischemia. He is now asymptomatic.  3. S/p AVR. Continue coumadin target INR 2.5-3.5. Routine SBE prophylaxis. Normal exam today.    Medication Adjustments/Labs and Tests Ordered: Current medicines are reviewed  at length with the patient today.  Concerns regarding medicines are outlined above.  Medication changes, Labs and  Tests ordered today are listed in the Patient Instructions below. Patient Instructions  Continue your current therapy  I will see you in 6 months      Signed, Cyprian Gongaware [place], MD  08/22/2017 11:55 AM    [facility] [address] [place], [place], [address] [phone]

## 2017-08-22 NOTE — Patient Instructions (Signed)
Continue your current therapy  I will see you in 6 months.   

## 2017-08-26 ENCOUNTER — Ambulatory Visit: Payer: Medicare Other | Admitting: Neurology

## 2017-08-26 ENCOUNTER — Telehealth: Payer: Self-pay | Admitting: Neurology

## 2017-08-26 NOTE — Telephone Encounter (Signed)
This patient did not show for a RV appointment today. He indicated that he was sick.

## 2017-08-27 ENCOUNTER — Encounter: Payer: Self-pay | Admitting: Neurology

## 2017-09-02 ENCOUNTER — Other Ambulatory Visit: Payer: Self-pay | Admitting: Family Medicine

## 2017-09-11 DIAGNOSIS — D225 Melanocytic nevi of trunk: Secondary | ICD-10-CM | POA: Diagnosis not present

## 2017-09-11 DIAGNOSIS — L821 Other seborrheic keratosis: Secondary | ICD-10-CM | POA: Diagnosis not present

## 2017-09-11 DIAGNOSIS — L814 Other melanin hyperpigmentation: Secondary | ICD-10-CM | POA: Diagnosis not present

## 2017-09-11 DIAGNOSIS — L570 Actinic keratosis: Secondary | ICD-10-CM | POA: Diagnosis not present

## 2017-09-11 DIAGNOSIS — Z85828 Personal history of other malignant neoplasm of skin: Secondary | ICD-10-CM | POA: Diagnosis not present

## 2017-09-11 DIAGNOSIS — D485 Neoplasm of uncertain behavior of skin: Secondary | ICD-10-CM | POA: Diagnosis not present

## 2017-09-11 DIAGNOSIS — L738 Other specified follicular disorders: Secondary | ICD-10-CM | POA: Diagnosis not present

## 2017-09-23 ENCOUNTER — Encounter: Payer: Self-pay | Admitting: Family Medicine

## 2017-10-03 ENCOUNTER — Other Ambulatory Visit: Payer: Self-pay | Admitting: Family Medicine

## 2017-10-14 DIAGNOSIS — F4310 Post-traumatic stress disorder, unspecified: Secondary | ICD-10-CM | POA: Diagnosis not present

## 2017-10-30 ENCOUNTER — Ambulatory Visit (INDEPENDENT_AMBULATORY_CARE_PROVIDER_SITE_OTHER): Payer: Medicare Other | Admitting: Family Medicine

## 2017-10-30 ENCOUNTER — Encounter: Payer: Self-pay | Admitting: Family Medicine

## 2017-10-30 VITALS — BP 126/70 | HR 68 | Resp 16 | Wt 205.0 lb

## 2017-10-30 DIAGNOSIS — Z7901 Long term (current) use of anticoagulants: Secondary | ICD-10-CM

## 2017-10-30 DIAGNOSIS — K219 Gastro-esophageal reflux disease without esophagitis: Secondary | ICD-10-CM | POA: Diagnosis not present

## 2017-10-30 DIAGNOSIS — Z6379 Other stressful life events affecting family and household: Secondary | ICD-10-CM | POA: Diagnosis not present

## 2017-10-30 LAB — PROTIME-INR
INR: 2.4 — ABNORMAL HIGH
Prothrombin Time: 25.5 s — ABNORMAL HIGH (ref 9.0–11.5)

## 2017-10-30 NOTE — Progress Notes (Signed)
Subjective:    Patient ID: Frederick Payne, male    DOB: 05-22-1961, 57 y.o.   MRN: [id number]  HPI He complains of a 69-month history of intermittent chest pain as well as a burning sensation he notes increases when he lies down.  He states it is more on the right sternal area than anywhere else.  He cannot relate this to food.  Movement and breathing make no different.  He has had no shortness of breath, diaphoresis or weakness.  He has also had several episodes of hypoglycemia described as sweating, shaking and fatigue that is relieved with food.  He admits to dietary indiscretion mainly due to home and work stress.  His in-laws are in town and he is also having to take care of her great-grandmother.  This interferes with his normal daily activities.  He also is helping to run a wine store.  He has tried Prilosec and did get some relief of his symptoms.  He has not had any nosebleeds, excessive bruising .  He has not checked his PT/INR recently and as mentioned above admits that his diet has not been regular. Review of Systems     Objective:   Physical Exam Alert and in no distress.  Cardiac exam does show a loud metallic S2.  Lungs are clear to auscultation no ecchymotic areas noted. PT/INR is 2.4    Assessment & Plan:  Long term current use of anticoagulant therapy - Plan: Protime-INR  Gastroesophageal reflux disease, esophagitis presence not specified  Stress due to illness of family member Discussed the fact that I think his chest symptoms are reflux related and recommended that he use Prilosec daily for the next week and then intermittently after that.  Cautioned him to be careful concerning this and his Coumadin. I then discussed the stress that he is under.  He is in counseling with Dayton Scrape who apparently has recommended the same thing that I did which is to try and insulate himself from everybody else's issues.  Over 25 minutes, greater than 50% spent in counseling and  coordination of care.  Pleasantly surprised that his INR was not worse.  I will have him work on his diet and check his PT/INR at home sometime over the weekend.

## 2017-10-30 NOTE — Patient Instructions (Signed)
Use Prilosec daily for the next week and see if that will take care of your symptoms and then after that as needed

## 2017-11-10 DIAGNOSIS — F4310 Post-traumatic stress disorder, unspecified: Secondary | ICD-10-CM | POA: Diagnosis not present

## 2017-11-26 ENCOUNTER — Other Ambulatory Visit: Payer: Self-pay | Admitting: Family Medicine

## 2017-11-27 ENCOUNTER — Other Ambulatory Visit: Payer: Self-pay | Admitting: Family Medicine

## 2017-11-27 DIAGNOSIS — F4310 Post-traumatic stress disorder, unspecified: Secondary | ICD-10-CM | POA: Diagnosis not present

## 2017-11-27 NOTE — Telephone Encounter (Signed)
Is this okay to refill? 

## 2017-12-24 ENCOUNTER — Ambulatory Visit: Payer: Medicare Other | Admitting: Family Medicine

## 2017-12-25 DIAGNOSIS — F4310 Post-traumatic stress disorder, unspecified: Secondary | ICD-10-CM | POA: Diagnosis not present

## 2017-12-28 ENCOUNTER — Other Ambulatory Visit: Payer: Self-pay | Admitting: Family Medicine

## 2017-12-29 ENCOUNTER — Encounter: Payer: Self-pay | Admitting: Family Medicine

## 2017-12-29 ENCOUNTER — Ambulatory Visit (INDEPENDENT_AMBULATORY_CARE_PROVIDER_SITE_OTHER): Payer: Medicare Other | Admitting: Family Medicine

## 2017-12-29 VITALS — BP 110/80 | HR 51 | Ht 68.0 in | Wt 205.8 lb

## 2017-12-29 DIAGNOSIS — D6851 Activated protein C resistance: Secondary | ICD-10-CM | POA: Diagnosis not present

## 2017-12-29 DIAGNOSIS — J45909 Unspecified asthma, uncomplicated: Secondary | ICD-10-CM

## 2017-12-29 DIAGNOSIS — D509 Iron deficiency anemia, unspecified: Secondary | ICD-10-CM

## 2017-12-29 DIAGNOSIS — Z86711 Personal history of pulmonary embolism: Secondary | ICD-10-CM

## 2017-12-29 DIAGNOSIS — Z87898 Personal history of other specified conditions: Secondary | ICD-10-CM | POA: Diagnosis not present

## 2017-12-29 DIAGNOSIS — Z951 Presence of aortocoronary bypass graft: Secondary | ICD-10-CM

## 2017-12-29 DIAGNOSIS — Z7901 Long term (current) use of anticoagulants: Secondary | ICD-10-CM

## 2017-12-29 DIAGNOSIS — G90522 Complex regional pain syndrome I of left lower limb: Secondary | ICD-10-CM | POA: Diagnosis not present

## 2017-12-29 DIAGNOSIS — K219 Gastro-esophageal reflux disease without esophagitis: Secondary | ICD-10-CM | POA: Diagnosis not present

## 2017-12-29 DIAGNOSIS — D649 Anemia, unspecified: Secondary | ICD-10-CM

## 2017-12-29 DIAGNOSIS — Z952 Presence of prosthetic heart valve: Secondary | ICD-10-CM

## 2017-12-29 DIAGNOSIS — J309 Allergic rhinitis, unspecified: Secondary | ICD-10-CM

## 2017-12-29 DIAGNOSIS — Z136 Encounter for screening for cardiovascular disorders: Secondary | ICD-10-CM | POA: Diagnosis not present

## 2017-12-29 DIAGNOSIS — F1011 Alcohol abuse, in remission: Secondary | ICD-10-CM

## 2017-12-29 MED ORDER — AMITRIPTYLINE HCL 10 MG PO TABS: 10.0000 mg | ORAL_TABLET | Freq: Every day | ORAL | 1 refills | Status: DC

## 2017-12-29 NOTE — Patient Instructions (Addendum)
Let go of the past so it does not keep controlling you and also let go of anger so it does not control.. Use the serenity prayer Insomnia Insomnia is a sleep disorder that makes it difficult to fall asleep or to stay asleep. Insomnia can cause tiredness (fatigue), low energy, difficulty concentrating, mood swings, and poor performance at work or school. There are three different ways to classify insomnia:  Difficulty falling asleep.  Difficulty staying asleep.  Waking up too early in the morning.  Any type of insomnia can be long-term (chronic) or short-term (acute). Both are common. Short-term insomnia usually lasts for three months or less. Chronic insomnia occurs at least three times a week for longer than three months. What are the causes? Insomnia may be caused by another condition, situation, or substance, such as:  Anxiety.  Certain medicines.  Gastroesophageal reflux disease (GERD) or other gastrointestinal conditions.  Asthma or other breathing conditions.  Restless legs syndrome, sleep apnea, or other sleep disorders.  Chronic pain.  Menopause. This may include hot flashes.  Stroke.  Abuse of alcohol, tobacco, or illegal drugs.  Depression.  Caffeine.  Neurological disorders, such as Alzheimer disease.  An overactive thyroid (hyperthyroidism).  The cause of insomnia may not be known. What increases the risk? Risk factors for insomnia include:  Gender. Women are more commonly affected than men.  Age. Insomnia is more common as you get older.  Stress. This may involve your professional or personal life.  Income. Insomnia is more common in people with lower income.  Lack of exercise.  Irregular work schedule or night shifts.  Traveling between different time zones.  What are the signs or symptoms? If you have insomnia, trouble falling asleep or trouble staying asleep is the main symptom. This may lead to other symptoms, such as:  Feeling  fatigued.  Feeling nervous about going to sleep.  Not feeling rested in the morning.  Having trouble concentrating.  Feeling irritable, anxious, or depressed.  How is this treated? Treatment for insomnia depends on the cause. If your insomnia is caused by an underlying condition, treatment will focus on addressing the condition. Treatment may also include:  Medicines to help you sleep.  Counseling or therapy.  Lifestyle adjustments.  Follow these instructions at home:  Take medicines only as directed by your health care provider.  Keep regular sleeping and waking hours. Avoid naps.  Keep a sleep diary to help you and your health care provider figure out what could be causing your insomnia. Include: ? When you sleep. ? When you wake up during the night. ? How well you sleep. ? How rested you feel the next day. ? Any side effects of medicines you are taking. ? What you eat and drink.  Make your bedroom a comfortable place where it is easy to fall asleep: ? Put up shades or special blackout curtains to block light from outside. ? Use a white noise machine to block noise. ? Keep the temperature cool.  Exercise regularly as directed by your health care provider. Avoid exercising right before bedtime.  Use relaxation techniques to manage stress. Ask your health care provider to suggest some techniques that may work well for you. These may include: ? Breathing exercises. ? Routines to release muscle tension. ? Visualizing peaceful scenes.  Cut back on alcohol, caffeinated beverages, and cigarettes, especially close to bedtime. These can disrupt your sleep.  Do not overeat or eat spicy foods right before bedtime. This can lead to digestive  discomfort that can make it hard for you to sleep.  Limit screen use before bedtime. This includes: ? Watching TV. ? Using your smartphone, tablet, and computer.  Stick to a routine. This can help you fall asleep faster. Try to do a  quiet activity, brush your teeth, and go to bed at the same time each night.  Get out of bed if you are still awake after 15 minutes of trying to sleep. Keep the lights down, but try reading or doing a quiet activity. When you feel sleepy, go back to bed.  Make sure that you drive carefully. Avoid driving if you feel very sleepy.  Keep all follow-up appointments as directed by your health care provider. This is important. Contact a health care provider if:  You are tired throughout the day or have trouble in your daily routine due to sleepiness.  You continue to have sleep problems or your sleep problems get worse. Get help right away if:  You have serious thoughts about hurting yourself or someone else. This information is not intended to replace advice given to you by your health care provider. Make sure you discuss any questions you have with your health care provider. Document Released: 10/11/2000 Document Revised: 03/15/2016 Document Reviewed: 07/15/2014 Elsevier Interactive Patient Education  [employer].

## 2017-12-29 NOTE — Progress Notes (Signed)
Frederick Payne is a 57 y.o. male who presents for annual wellness visit and follow-up on chronic medical conditions.  He continues in counseling with Dayton Scrape and is apparently making some progress.  He does complain of a 66-month history of left shoulder pain that sometimes wakes him up but no history of injury or overuse.  He continues on multiple OTC medications.  He is also taking Toprol.  He does have an underlying history of anemia as well as factor V Leiden deficiency.  He has had previous PEs.  He does periodically check his PT/INR but admits to not being very compliant with this.  He does have a history of RSD and states that this does cause a lot of pain which also interferes with his sleep.Frederick Payne  He continues to intermittently drink and so far has not had any in several months.  The drinking usually her bowels from being under stress.  His reflux is under good control.  He does have underlying allergies and asthma but rarely needs asthma medications.  Usually this is during the spring allergy season.   Immunizations and Health Maintenance Immunization History  Administered Date(s) Administered  . Influenza,inj,Quad PF,6+ Mos 06/24/2016, 07/16/2017  . Influenza-Unspecified 08/26/2014  . Pneumococcal Conjugate-13 08/29/2014  . Pneumococcal Polysaccharide-23 10/17/2009, 04/27/2013  . Tdap 06/06/2014, 01/23/2016  . Zoster 08/18/2013   Health Maintenance Due  Topic Date Due  . Hepatitis C Screening  [birth year]  . COLONOSCOPY  06/11/2011    Last colonoscopy:2012 Last PSA:-- [facility]ist: [facility] in [place] Ophtho: [place] Exercise: No regular exercise program which  Other doctors caring for patient include:  Hunt Lupton  P [place]  [place] Advanced Directives: no copy asked for    Depression screen:  See questionnaire below.     Depression screen PHQ 2/9 07/16/2017  Decreased Interest 0  Down, Depressed, Hopeless 0  PHQ - 2 Score 0    Fall Screen: See Questionaire  below.  ADL screen:  See questionnaire below.  Functional Status Survey:     Review of Systems  Constitutional: -, -unexpected weight change, -anorexia, -fatigue Allergy: -sneezing, -itching, -congestion Dermatology: denies changing moles, rash, lumps ENT: -runny nose, -ear pain, -sore throat,  Cardiology:  -chest pain, -palpitations, -orthopnea, Respiratory: -cough, -shortness of breath, -dyspnea on exertion, -wheezing,  Gastroenterology: -abdominal pain, -nausea, -vomiting, -diarrhea, -constipation, -dysphagia Hematology: -bleeding or bruising problems Musculoskeletal: -arthralgias, -myalgias, -joint swelling, -back pain, - Ophthalmology: -vision changes,  Urology: -dysuria, -difficulty urinating,  -urinary frequency, -urgency, incontinence Neurology: -, -numbness, , -memory loss, -falls, -dizziness    PHYSICAL EXAM:  BP 110/80   Pulse (!) 51   Ht 5\' 8"  (1.727 m)   Wt 255 lb 12.8 oz (116 kg)   SpO2 97%   BMI 38.89 kg/m   General Appearance: Alert, cooperative, no distress, appears stated age Head: Normocephalic, without obvious abnormality, atraumatic Eyes: PERRL, conjunctiva/corneas clear, EOM's intact, fundi benign Ears: Normal TM's and external ear canals Nose: Nares normal, mucosa normal, no drainage or sinus   tenderness Throat: Lips, mucosa, and tongue normal; teeth and gums normal Neck: Supple, no lymphadenopathy, thyroid:no enlargement/tenderness/nodules; no carotid bruit or JVD Lungs: Clear to auscultation bilaterally without wheezes, rales or ronchi; respirations unlabored Heart: Regular rate and rhythm, S1 and S2 normal, no murmur, rub or gallop Abdomen: Soft, non-tender, nondistended, normoactive bowel sounds, no masses, no hepatosplenomegaly Extremities: No clubbing, cyanosis or edema Pulses: 2+ and symmetric all extremities Skin: Skin color, texture, turgor normal, no rashes or lesions  Lymph nodes: Cervical, supraclavicular, and axillary nodes  normal Neurologic: CNII-XII intact, normal strength, sensation and gait; reflexes 2+ and symmetric throughout   Psych: Normal mood, affect, hygiene and grooming  ASSESSMENT/PLAN: Long term current use of anticoagulant therapy - Plan: Protime-INR  Gastroesophageal reflux disease, esophagitis presence not specified  Iron deficiency anemia, unspecified iron deficiency anemia type - Plan: CBC with Differential/Platelet, Comprehensive metabolic panel  Anemia, unspecified type - Plan: CBC with Differential/Platelet, Comprehensive metabolic panel  Allergic rhinitis, unspecified seasonality, unspecified trigger  Asthma with allergic rhinitis without complication, unspecified asthma severity  Factor V Leiden (HCC)  History of alcohol abuse  History of pulmonary embolus (PE)  Complex regional pain syndrome type 1 of left lower extremity - Plan: amitriptyline (ELAVIL) 10 MG tablet  S/P CABG (coronary artery bypass graft) - Plan: CBC with Differential/Platelet, Comprehensive metabolic panel, Lipid panel  S/P AVR He will need to get periodic follow-up here as well as with his other specialists.  Strongly encouraged him to continue in counseling with Dayton Scrape.  Did give some guidance in regard to sleep issues.  We will reevaluate his shoulder at a later date.  I will give him amitriptyline to see if this will help with sleep issues as well as with his RSD.   I encouraged him to continue on his present medications.  Also discussed proper diet for him.Medicare Attestation I have personally reviewed: The patient's medical and social history Their use of alcohol, tobacco or illicit drugs Their current medications and supplements The patient's functional ability including ADLs,fall risks, home safety risks, cognitive, and hearing and visual impairment Diet and physical activities Evidence for depression or mood disorders  The patient's weight, height, and BMI have been recorded in the chart.   I have made referrals, counseling, and provided education to the patient based on review of the above and I have provided the patient with a written personalized care plan for preventive services.     Sharlot Gowda, MD   12/29/2017

## 2017-12-30 ENCOUNTER — Telehealth: Payer: Self-pay

## 2017-12-30 LAB — COMPREHENSIVE METABOLIC PANEL
ALT: 25 IU/L (ref 0–44)
AST: 29 IU/L (ref 0–40)
Albumin/Globulin Ratio: 1.7 (ref 1.2–2.2)
Albumin: 4.3 g/dL (ref 3.5–5.5)
Alkaline Phosphatase: 37 IU/L — ABNORMAL LOW (ref 39–117)
BUN/Creatinine Ratio: 14 (ref 9–20)
BUN: 12 mg/dL (ref 6–24)
Bilirubin Total: 0.5 mg/dL (ref 0.0–1.2)
CO2: 21 mmol/L (ref 20–29)
Calcium: 9.3 mg/dL (ref 8.7–10.2)
Chloride: 105 mmol/L (ref 96–106)
Creatinine, Ser: 0.87 mg/dL (ref 0.76–1.27)
GFR calc Af Amer: 112 mL/min/{1.73_m2} (ref >59)
GFR calc non Af Amer: 96 mL/min/{1.73_m2} (ref >59)
Globulin, Total: 2.6 g/dL (ref 1.5–4.5)
Glucose: 96 mg/dL (ref 65–99)
Potassium: 4.5 mmol/L (ref 3.5–5.2)
Sodium: 140 mmol/L (ref 134–144)
Total Protein: 6.9 g/dL (ref 6.0–8.5)

## 2017-12-30 LAB — LIPID PANEL
Chol/HDL Ratio: 5.6 ratio — ABNORMAL HIGH (ref 0.0–5.0)
Cholesterol, Total: 191 mg/dL (ref 100–199)
HDL: 34 mg/dL — ABNORMAL LOW (ref >39)
LDL Calculated: 116 mg/dL — ABNORMAL HIGH (ref 0–99)
Triglycerides: 206 mg/dL — ABNORMAL HIGH (ref 0–149)
VLDL Cholesterol Cal: 41 mg/dL — ABNORMAL HIGH (ref 5–40)

## 2017-12-30 LAB — CBC WITH DIFFERENTIAL/PLATELET
Basophils Absolute: 0 10*3/uL (ref 0.0–0.2)
Basos: 0 %
EOS (ABSOLUTE): 0.2 10*3/uL (ref 0.0–0.4)
Eos: 4 %
Hematocrit: 43.4 % (ref 37.5–51.0)
Hemoglobin: 14.6 g/dL (ref 13.0–17.7)
Immature Grans (Abs): 0 10*3/uL (ref 0.0–0.1)
Immature Granulocytes: 0 %
Lymphocytes Absolute: 1.2 10*3/uL (ref 0.7–3.1)
Lymphs: 28 %
MCH: 29.4 pg (ref 26.6–33.0)
MCHC: 33.6 g/dL (ref 31.5–35.7)
MCV: 87 fL (ref 79–97)
Monocytes Absolute: 0.2 10*3/uL (ref 0.1–0.9)
Monocytes: 6 %
Neutrophils Absolute: 2.7 10*3/uL (ref 1.4–7.0)
Neutrophils: 62 %
Platelets: 147 10*3/uL — ABNORMAL LOW (ref 150–379)
RBC: 4.97 x10E6/uL (ref 4.14–5.80)
RDW: 14.7 % (ref 12.3–15.4)
WBC: 4.3 10*3/uL (ref 3.4–10.8)

## 2017-12-30 LAB — PROTIME-INR
INR: 7.2 (ref 0.8–1.2)
Prothrombin Time: 66.8 s — ABNORMAL HIGH (ref 9.1–12.0)

## 2017-12-30 NOTE — Telephone Encounter (Signed)
Critical labs on pt . [facility] called to let us know that hi PT was 66.8 and his INR was 7.2. Please advise thanks [facility]

## 2018-01-09 DIAGNOSIS — L570 Actinic keratosis: Secondary | ICD-10-CM | POA: Diagnosis not present

## 2018-01-16 ENCOUNTER — Ambulatory Visit: Payer: Medicare Other | Admitting: Family Medicine

## 2018-02-26 ENCOUNTER — Ambulatory Visit (INDEPENDENT_AMBULATORY_CARE_PROVIDER_SITE_OTHER): Payer: Medicare Other | Admitting: Family Medicine

## 2018-02-26 VITALS — BP 102/68 | HR 48 | Temp 97.9°F | Wt 205.4 lb

## 2018-02-26 DIAGNOSIS — M545 Low back pain, unspecified: Secondary | ICD-10-CM

## 2018-02-26 MED ORDER — CYCLOBENZAPRINE HCL 10 MG PO TABS: 10.0000 mg | ORAL_TABLET | Freq: Three times a day (TID) | ORAL | 0 refills | Status: DC | PRN

## 2018-02-26 MED ORDER — TRAMADOL HCL 50 MG PO TABS: 100.0000 mg | ORAL_TABLET | Freq: Three times a day (TID) | ORAL | 0 refills | Status: DC | PRN

## 2018-02-26 NOTE — Patient Instructions (Addendum)
Check your PT/INR regularly and readjust your Coumadin accordingly Check your INR Saturday call us

## 2018-02-27 NOTE — Progress Notes (Signed)
Subjective:    Patient ID: Frederick Payne, male    DOB: 1961/10/19, 57 y.o.   MRN: [id number]  HPI He complains of a several day history of low back pain.  No history of injury or overuse.  He does have a previous history of difficulty with low back pain over the years but nothing of any major significance.  No numbness, tingling or weakness.   Review of Systems     Objective:   Physical Exam Alert and complaining of back pain.  Loss of lumbar lordosis with splinting noted.  Tender to palpation over the paraspinal and over the posterior processes of the spine.  Negative straight leg raising.  DTRs normal.      Assessment & Plan:  Acute bilateral low back pain without sciatica - Plan: traMADol (ULTRAM) 50 MG tablet, cyclobenzaprine (FLEXERIL) 10 MG tablet  I will give him pain meds as well as a muscle relaxer.  Cautioned to keep track of his PT/INR and readjust accordingly.  He is comfortable with this.

## 2018-03-12 DIAGNOSIS — F4310 Post-traumatic stress disorder, unspecified: Secondary | ICD-10-CM | POA: Diagnosis not present

## 2018-04-09 ENCOUNTER — Ambulatory Visit (INDEPENDENT_AMBULATORY_CARE_PROVIDER_SITE_OTHER): Payer: Medicare Other | Admitting: Family Medicine

## 2018-04-09 ENCOUNTER — Encounter: Payer: Self-pay | Admitting: Family Medicine

## 2018-04-09 VITALS — BP 110/72 | HR 51 | Temp 98.0°F | Wt 212.4 lb

## 2018-04-09 DIAGNOSIS — D6851 Activated protein C resistance: Secondary | ICD-10-CM

## 2018-04-09 DIAGNOSIS — J0100 Acute maxillary sinusitis, unspecified: Secondary | ICD-10-CM | POA: Diagnosis not present

## 2018-04-09 DIAGNOSIS — R059 Cough, unspecified: Secondary | ICD-10-CM

## 2018-04-09 DIAGNOSIS — M79676 Pain in unspecified toe(s): Secondary | ICD-10-CM | POA: Diagnosis not present

## 2018-04-09 DIAGNOSIS — R05 Cough: Secondary | ICD-10-CM

## 2018-04-09 DIAGNOSIS — Z7901 Long term (current) use of anticoagulants: Secondary | ICD-10-CM | POA: Diagnosis not present

## 2018-04-09 MED ORDER — AMOXICILLIN 875 MG PO TABS: 875.0000 mg | ORAL_TABLET | Freq: Two times a day (BID) | ORAL | 0 refills | Status: DC

## 2018-04-09 MED ORDER — HYDROCOD POLST-CPM POLST ER 10-8 MG/5ML PO SUER: 5.0000 mL | Freq: Every evening | ORAL | 0 refills | Status: DC | PRN

## 2018-04-09 NOTE — Patient Instructions (Signed)
Take all the antibiotic and call me if you are not totally back to normal Tylenol for pain relief Closely monitor your Coumadin and the INR

## 2018-04-09 NOTE — Progress Notes (Signed)
Subjective:    Patient ID: Frederick Payne, male    DOB: 1960-12-29, 57 y.o.   MRN: [id number]  HPI He complains of a 3-week history of started with sore throat, sneezing, cough as well as chest congestion and rhinorrhea.  The coughing has gotten worse.  The rhinorrhea and postnasal drainage has become more purulent especially in the last week.  He also has noted some drainage from both eyes.  He has been using NyQuil.  He continues to monitor his Coumadin levels and last number was 2.5.  He does complain of bilateral foot pain.  Presently he is not drinking.   Review of Systems     Objective:   Physical Exam Alert and in no distress.  Nasal mucosa is normal.  Tender over maxillary sinuses.  Tympanic membranes and canals are normal. Pharyngeal area is normal. Neck is supple without adenopathy or thyromegaly. Cardiac exam shows a regular sinus rhythm without murmurs or gallops. Lungs are clear to auscultation. Exam of both great toes does show a small area of subungual hematoma in the same area on both toes which was lateral.  He also shows this probably was several months ago as toenail growth is noted.       Assessment & Plan:  Long term current use of anticoagulant therapy  Factor V Leiden (HCC)  Acute non-recurrent maxillary sinusitis - Plan: amoxicillin (AMOXIL) 875 MG tablet  Cough - Plan: chlorpheniramine-HYDROcodone (TUSSIONEX PENNKINETIC ER) 10-8 MG/5ML SUER  Pain of great toe, unspecified laterality He is to continue to monitor his PT/INR especially in regard to using Amoxil and Tussionex.  He does understand this. Explained using Tylenol for his pain and the fact that this is probably an old injury that will slowly improve.

## 2018-05-04 ENCOUNTER — Other Ambulatory Visit: Payer: Self-pay | Admitting: Family Medicine

## 2018-05-04 ENCOUNTER — Other Ambulatory Visit: Payer: Self-pay

## 2018-05-04 ENCOUNTER — Telehealth: Payer: Self-pay

## 2018-05-04 MED ORDER — LEVALBUTEROL TARTRATE 45 MCG/ACT IN AERO: INHALATION_SPRAY | RESPIRATORY_TRACT | 0 refills | Status: DC

## 2018-05-04 MED ORDER — WARFARIN SODIUM 5 MG PO TABS: 5.0000 mg | ORAL_TABLET | Freq: Every day | ORAL | 1 refills | Status: DC

## 2018-05-04 NOTE — Telephone Encounter (Signed)
ok 

## 2018-05-04 NOTE — Telephone Encounter (Signed)
Pt would like a refill for his warfarin and albuterol sent into [facility]. Pt last appt was 6/19 for med check please advise I inhaler is ok to send in [facility]

## 2018-05-05 NOTE — Telephone Encounter (Signed)
Done per Dr. Gretel Acre.

## 2018-05-08 ENCOUNTER — Telehealth: Payer: Self-pay

## 2018-05-08 MED ORDER — ALBUTEROL SULFATE HFA 108 (90 BASE) MCG/ACT IN AERS: INHALATION_SPRAY | RESPIRATORY_TRACT | 0 refills | Status: DC

## 2018-05-08 NOTE — Telephone Encounter (Signed)
Has he ever used albuterol in the past? Be aware that albuterol may increase his heart rate. If he can tolerate this then ok to send in one inhaler and have him follow with Dr. Redmond School.

## 2018-05-08 NOTE — Telephone Encounter (Signed)
Pharmacy has stated that patient insurance will not cover levalbuterol that was sent to the pharmacy but they will cover Albuterol. A prescription for albuterol is being requested.   Patient has stated he needs his inhaler because he has been having a hard time breathing. Please advise.

## 2018-05-08 NOTE — Telephone Encounter (Signed)
Pt has been on albuterol before and prefer that medicine again. I will send in one inhaler and advised pt to follow-up

## 2018-05-13 DIAGNOSIS — F4310 Post-traumatic stress disorder, unspecified: Secondary | ICD-10-CM | POA: Diagnosis not present

## 2018-05-26 ENCOUNTER — Encounter: Payer: Self-pay | Admitting: Neurology

## 2018-05-26 ENCOUNTER — Other Ambulatory Visit: Payer: Self-pay

## 2018-05-26 ENCOUNTER — Ambulatory Visit (INDEPENDENT_AMBULATORY_CARE_PROVIDER_SITE_OTHER): Payer: Medicare Other | Admitting: Neurology

## 2018-05-26 VITALS — BP 125/81 | HR 73 | Ht 68.0 in | Wt 207.0 lb

## 2018-05-26 DIAGNOSIS — M5432 Sciatica, left side: Secondary | ICD-10-CM | POA: Diagnosis not present

## 2018-05-26 DIAGNOSIS — G90522 Complex regional pain syndrome I of left lower limb: Secondary | ICD-10-CM

## 2018-05-26 MED ORDER — GABAPENTIN 300 MG PO CAPS: 300.0000 mg | ORAL_CAPSULE | Freq: Two times a day (BID) | ORAL | 3 refills | Status: DC

## 2018-05-26 NOTE — Patient Instructions (Signed)
We will get MRI of the low back, and start gabapentin 300 mg twice a day for the back and leg pain.  Neurontin (gabapentin) may result in drowsiness, ankle swelling, gait instability, or possibly dizziness. Please contact our office if significant side effects occur with this medication.

## 2018-05-26 NOTE — Progress Notes (Signed)
Reason for visit: Left leg pain  Frederick Payne is an 57 y.o. male  History of present illness:  Frederick Payne is a 57 year old right-handed white male with a history of back pain and left leg pain.  The patient is felt to have reflex synthetic dystrophy, he was referred to Dr. Hardin Negus but he never kept the appointment.  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  The patient is not on any medication for pain currently.  He remains on Coumadin, he has an artificial heart valve.  The patient has pain in the low back that goes down the left leg to the foot, the patient has shocking sensations in the left ankle and foot that may wake him up at night.  The patient occasionally may have a collapse of the left leg, usually he is able to ambulate fairly normally.  He claims that he has never had MRI evaluation of the low back.  He comes at this office for an evaluation.  Past Medical History:  Diagnosis Date  . Aortic stenosis    Had surgery performed  . CAD (coronary artery disease)   . Chronic anemia   . Deep vein thrombosis ([place]) 2006   After a broken ankle--recently diagnosed with reflex sympathetic dystrophy of the left lower extremity  . Elevated LFTs    h/o excessive alcohol  . Factor V Leiden ([place])   . Hypertension   . Ischemic heart disease   . Psychiatric disorder    Underlying  . PUD (peptic ulcer disease)    with bleeding  . Reflex sympathetic dystrophy     Past Surgical History:  Procedure Laterality Date  . AORTIC VALVE REPLACEMENT  07/15/12   27 mm [employer]  . CORONARY ARTERY BYPASS GRAFT     x1  . GALLBLADDER SURGERY  2008  . gastrectomy and vagotomy    . greenfield filter      Family History  Problem Relation Age of Onset  . Healthy Father   . Stroke Paternal Grandmother     Social history:  reports that he has quit smoking. He has never used smokeless tobacco. He reports that he does not drink alcohol  or use drugs.    Allergies  Allergen Reactions  . Benzodiazepines Other (See Comments)    Causes pt to get violent   . Contrast Media [Iodinated Diagnostic Agents] Hives  . Iodine Anaphylaxis  . Other Anaphylaxis    FRUITS FROM TREES.   . Pectin Anaphylaxis  . Shellfish Allergy Anaphylaxis  . Sulfa Antibiotics Anaphylaxis  . Diazepam Other (See Comments)    Headache, strange behavior, memory loss  . Lactose Intolerance (Gi) Nausea And Vomiting    Medications:  Prior to Admission medications   Medication Sig Start Date End Date Taking? Authorizing Provider  albuterol (PROVENTIL HFA;VENTOLIN HFA) 108 (90 Base) MCG/ACT inhaler 2 puffs every 4-6 hours as needed 05/08/18  Yes Henson, Vickie L, NP-C  ascorbic acid (VITAMIN C) 500 MG tablet Take 500 mg by mouth daily.   Yes [provider]  aspirin 81 MG tablet Take 81 mg by mouth daily.   Yes [provider]  COAGUCHEK LANCETS [place] 1 each by Does not apply route as directed. 02/14/16  Yes Brunetta Genera, MD  Copper Gluconate (COPPER CAPS) 2 MG CAPS Take by mouth every evening.   Yes [provider]  FEROSUL 325 (65 Fe) MG tablet  TAKE 1 TABLET BY MOUTH EVERY DAY 05/04/18  Yes Denita Lung, MD  fish oil-omega-3 fatty acids 1000 MG capsule Take 1 g by mouth daily.   Yes [provider]  Fluticasone Furoate (ARNUITY ELLIPTA) 100 MCG/ACT AEPB Inhale 1 puff into the lungs daily. 02/15/16  Yes Denita Lung, MD  levalbuterol [facility] HFA) 45 MCG/ACT inhaler INHALE 1-2 PUFFS INTO THE LUNGS EVERY 4-6 HOURS AS NEEDED FOR WHEEZING OR SHORTNESS OF BREATH 05/05/18  Yes Denita Lung, MD  metoprolol succinate (TOPROL XL) 25 MG 24 hr tablet Take 1 tablet (25 mg total) by mouth daily. 08/22/17  Yes [place]ique, Peter M, MD  metoprolol succinate (TOPROL-XL) 50 MG 24 hr tablet Take 1 tablet (50 mg total) by mouth daily. Take with or immediately following a meal. 08/22/17 08/17/18 Yes [place], Peter M, MD  Multiple  Vitamins-Minerals (ONE-A-DAY MENS 50+ ADVANTAGE PO) Take by mouth daily.   Yes [provider]  POLICOSANOL PO Take 20 mg by mouth daily.   Yes [provider]  POLY-IRON 150 150 MG capsule TAKE 1 CAPSULE BY MOUTH EVERY DAY 10/03/17  Yes Denita Lung, MD  POLY-IRON 150 150 MG capsule TAKE 1 CAPSULE BY MOUTH EVERY DAY 11/27/17  Yes Denita Lung, MD  PT/INR Test [facility] PT TEST) STRP 1 each by In Vitro route as directed. 02/14/16  Yes Brunetta Genera, MD  PT/INR Testing Monitor (COAGUCHEK XS PLUS SYSTEM) KIT 1 each by Does not apply route as directed. 02/14/16  Yes Brunetta Genera, MD  S-Adenosylmethionine (SAM-E) 400 MG TABS Take 1 tablet by mouth daily.   Yes [provider]  [facility] Wort 300 MG CAPS Take 300 mg by mouth 2 (two) times daily.   Yes [provider]  Thiamine HCl (B-1) 100 MG TABS Take by mouth.   Yes [provider]  warfarin (COUMADIN) 10 MG tablet TAKE 1 TABLET BY MOUTH EVERY DAY 11/26/17  Yes Denita Lung, MD  warfarin (COUMADIN) 2 MG tablet TAKE 1 TABLET BY MOUTH DAILY 01/08/16  Yes Denita Lung, MD  warfarin (COUMADIN) 5 MG tablet TAKE 1 TABLET BY MOUTH EVERY DAY 05/05/18  Yes Denita Lung, MD  gabapentin (NEURONTIN) 300 MG capsule Take 1 capsule (300 mg total) by mouth 2 (two) times daily. 05/26/18   Kathrynn Ducking, MD    ROS:  Out of a complete 14 system review of symptoms, the patient complains only of the following symptoms, and all other reviewed systems are negative.  Leg swelling Back pain  Blood pressure 125/81, pulse 73, height '5\' 8"'$  (1.727 m), weight 207 lb (93.9 kg).  Physical Exam  General: The patient is alert and cooperative at the time of the examination.  Neuromuscular: Range move the low back is relatively full.  Skin: 1+ edema at the left ankle as noted.   Neurologic Exam  Mental status: The patient is alert and oriented x 3 at the time of the examination. The patient has  apparent normal recent and remote memory, with an apparently normal attention span and concentration ability.   Cranial nerves: Facial symmetry is present. Speech is normal, no aphasia or dysarthria is noted. Extraocular movements are full. Visual fields are full.  Motor: The patient has good strength in all 4 extremities.  The patient is able to walk on heels and the toes bilaterally.  Sensory examination: Soft touch sensation is symmetric on the face, arms, and legs.  Coordination: The patient  has good finger-nose-finger and heel-to-shin bilaterally.  Gait and station: The patient has a normal gait. Tandem gait is normal. Romberg is negative. No drift is seen.  Reflexes: Deep tendon reflexes are symmetric.   Assessment/Plan:  1.  Chronic low back pain, left leg pain  The patient is felt to have reflex sympathetic dystrophy of the left leg.  The patient has not had any evaluation of the low back, he will have MRI of the lumbar spine.  He will be placed on gabapentin 300 mg twice daily.  He will follow-up in 6 months.  Jill Alexanders MD 05/26/2018 11:07 AM  [facility] [address] [place] [employer], [place] [address]  Phone [phone] Fax [phone]

## 2018-05-27 ENCOUNTER — Encounter: Payer: Self-pay | Admitting: Neurology

## 2018-05-29 ENCOUNTER — Encounter: Payer: Self-pay | Admitting: Radiology

## 2018-05-31 ENCOUNTER — Ambulatory Visit
Admission: RE | Admit: 2018-05-31 | Discharge: 2018-05-31 | Disposition: A | Discharge status: Home / Self Care | Payer: Medicare Other | Source: Ambulatory Visit | Attending: Neurology | Admitting: Neurology

## 2018-05-31 ENCOUNTER — Telehealth: Payer: Self-pay | Admitting: Neurology

## 2018-05-31 DIAGNOSIS — M5432 Sciatica, left side: Secondary | ICD-10-CM

## 2018-05-31 NOTE — Telephone Encounter (Signed)
  I called the patient. The MRI does not show a surgically amenable issue with the lumbar spine.  MRI lumbar 05/31/18:  IMPRESSION: This MRI of the lumbar spine without contrast shows tentative changes as detailed above.  The most significant findings are: 1.   At L1-L2, there is a small right paramedian disc protrusion.  There is no spinal stenosis or nerve root compression.   2.   At L5-S1, there is 1 to 2 mm of anterolisthesis.  There is no nerve root compression or spinal stenosis.

## 2018-07-14 ENCOUNTER — Encounter: Payer: Self-pay | Admitting: Family Medicine

## 2018-07-14 ENCOUNTER — Ambulatory Visit (INDEPENDENT_AMBULATORY_CARE_PROVIDER_SITE_OTHER): Payer: Medicare Other | Admitting: Family Medicine

## 2018-07-14 VITALS — BP 116/78 | HR 66 | Temp 98.2°F | Wt 203.0 lb

## 2018-07-14 DIAGNOSIS — Z7901 Long term (current) use of anticoagulants: Secondary | ICD-10-CM

## 2018-07-14 DIAGNOSIS — D6851 Activated protein C resistance: Secondary | ICD-10-CM | POA: Diagnosis not present

## 2018-07-14 DIAGNOSIS — R197 Diarrhea, unspecified: Secondary | ICD-10-CM

## 2018-07-14 DIAGNOSIS — Z23 Encounter for immunization: Secondary | ICD-10-CM

## 2018-07-14 MED ORDER — WARFARIN SODIUM 10 MG PO TABS: 10.0000 mg | ORAL_TABLET | Freq: Every day | ORAL | 0 refills | Status: DC

## 2018-07-14 MED ORDER — WARFARIN SODIUM 5 MG PO TABS: 5.0000 mg | ORAL_TABLET | Freq: Every day | ORAL | 1 refills | Status: DC

## 2018-07-14 NOTE — Progress Notes (Signed)
Subjective:    Patient ID: Frederick Payne, male    DOB: Jun 04, 1961, 56 y.o.   MRN: [id number]  HPI He complains of a 3-week history of difficulty with diarrhea, abdominal pain and some nausea but no vomiting.  He states that the stool is blackish green.  He has been on no antibiotics.  He continues on his Coumadin dosing and is alternating between 5 and 10 mg dosing based on the amount of greens that he is eating.  He states he keeps his PT/INR in a good range that way.    Review of Systems     Objective:   Physical Exam Alert and in no distress. Tympanic membranes and canals are normal. Pharyngeal area is normal. Neck is supple without adenopathy or thyromegaly. Cardiac exam shows a regular sinus rhythm without murmurs or gallops. Lungs are clear to auscultation. Abdominal exam shows active bowel sounds without masses or tenderness.  No purpuric lesions noted on his skin. Stool was guaiac negative      Assessment & Plan:  Factor V Leiden (HCC) - Plan: Protime-INR, warfarin (COUMADIN) 10 MG tablet, warfarin (COUMADIN) 5 MG tablet  Long term current use of anticoagulant therapy - Plan: Protime-INR, warfarin (COUMADIN) 10 MG tablet, warfarin (COUMADIN) 5 MG tablet  Diarrhea, unspecified type - Plan: Cdiff NAA+O+P+Stool Culture, Cdiff NAA+O+P+Stool Culture  Need for influenza vaccination - Plan: Flu Vaccine QUAD 6+ mos PF IM (Fluarix Quad PF) Difficult to say what is causing the diarrhea but after 3 weeks I think it is worthwhile looking for a cause.

## 2018-07-15 LAB — PROTIME-INR
INR: 1.7 — ABNORMAL HIGH (ref 0.8–1.2)
Prothrombin Time: 17 s — ABNORMAL HIGH (ref 9.1–12.0)

## 2018-08-24 ENCOUNTER — Other Ambulatory Visit: Payer: Self-pay | Admitting: Family Medicine

## 2018-08-26 ENCOUNTER — Other Ambulatory Visit: Payer: Self-pay | Admitting: *Deleted

## 2018-08-26 MED ORDER — METOPROLOL SUCCINATE ER 50 MG PO TB24: 50.0000 mg | ORAL_TABLET | Freq: Every day | ORAL | 0 refills | Status: DC

## 2018-10-16 ENCOUNTER — Other Ambulatory Visit: Payer: Self-pay | Admitting: Family Medicine

## 2018-10-16 DIAGNOSIS — Z7901 Long term (current) use of anticoagulants: Secondary | ICD-10-CM

## 2018-10-16 DIAGNOSIS — D6851 Activated protein C resistance: Secondary | ICD-10-CM

## 2018-10-16 NOTE — Telephone Encounter (Signed)
[facility] is requesting to fill pt warfarin. Pt last ov was 07-14-18 and last labs were done 07-14-18. Please advise if med can be filled. [place]

## 2018-11-26 ENCOUNTER — Encounter: Payer: Self-pay | Admitting: Neurology

## 2018-11-26 ENCOUNTER — Ambulatory Visit (INDEPENDENT_AMBULATORY_CARE_PROVIDER_SITE_OTHER): Payer: Medicare Other | Admitting: Neurology

## 2018-11-26 VITALS — BP 117/77 | HR 56 | Ht 68.0 in | Wt 207.0 lb

## 2018-11-26 DIAGNOSIS — G90522 Complex regional pain syndrome I of left lower limb: Secondary | ICD-10-CM

## 2018-11-26 DIAGNOSIS — S39012S Strain of muscle, fascia and tendon of lower back, sequela: Secondary | ICD-10-CM

## 2018-11-26 MED ORDER — LEVETIRACETAM 500 MG PO TABS: 500.0000 mg | ORAL_TABLET | Freq: Two times a day (BID) | ORAL | 3 refills | Status: DC

## 2018-11-26 MED ORDER — PREDNISONE 5 MG PO TABS: ORAL_TABLET | ORAL | 0 refills | Status: DC

## 2018-11-26 MED ORDER — METHOCARBAMOL 500 MG PO TABS: 500.0000 mg | ORAL_TABLET | Freq: Three times a day (TID) | ORAL | 1 refills | Status: DC

## 2018-11-26 NOTE — Progress Notes (Signed)
Reason for visit: Reflect sympathetic dystrophy  Frederick Payne is an 58 y.o. male  History of present illness:  Frederick Payne is a 57 year old right-handed white male with a history of chronic leg pain felt secondary to reflex sympathetic dystrophy.  The patient in the past has not wished to go on Lyrica, he has been on gabapentin in the past but he does not know how high the dose he was on previously.  He was started on gabapentin again in the summer 2019, he only took 600 mg daily and then stopped after it was not helpful for him.  The patient has undergone MRI of the lumbar spine that did not show any surgically amenable problems.  Within the last 2 weeks, the patient had significant onset of back pain following a sneeze.  He is improving slightly but he still has a lot of stiffness and pain.  He has fallen on 2 occasions with collapse of the left leg, he will get a sharp pain in the leg and then go down.  The patient returns to the office today for an evaluation.  Past Medical History:  Diagnosis Date  . Aortic stenosis    Had surgery performed  . CAD (coronary artery disease)   . Chronic anemia   . Deep vein thrombosis ([place]) 2006   After a broken ankle--recently diagnosed with reflex sympathetic dystrophy of the left lower extremity  . Elevated LFTs    h/o excessive alcohol  . Factor V Leiden ([place])   . Hypertension   . Ischemic heart disease   . Psychiatric disorder    Underlying  . PUD (peptic ulcer disease)    with bleeding  . Reflex sympathetic dystrophy     Past Surgical History:  Procedure Laterality Date  . AORTIC VALVE REPLACEMENT  07/15/12   27 mm St Jude  . CORONARY ARTERY BYPASS GRAFT     x1  . GALLBLADDER SURGERY  2008  . gastrectomy and vagotomy    . greenfield filter      Family History  Problem Relation Age of Onset  . Healthy Father   . Stroke Paternal Grandmother     Social history:  reports that he has quit smoking. He has never used smokeless  tobacco. He reports that he does not drink alcohol or use drugs.    Allergies  Allergen Reactions  . Benzodiazepines Other (See Comments)    Causes pt to get violent   . Contrast Media [Iodinated Diagnostic Agents] Hives  . Iodine Anaphylaxis  . Other Anaphylaxis    FRUITS FROM TREES.   . Pectin Anaphylaxis  . Shellfish Allergy Anaphylaxis  . Sulfa Antibiotics Anaphylaxis  . Diazepam Other (See Comments)    Headache, strange behavior, memory loss  . Lactose Intolerance (Gi) Nausea And Vomiting    Medications:  Prior to Admission medications   Medication Sig Start Date End Date Taking? Authorizing Provider  albuterol (PROVENTIL HFA;VENTOLIN HFA) 108 (90 Base) MCG/ACT inhaler 2 puffs every 4-6 hours as needed 05/08/18  Yes Henson, Vickie L, NP-C  ascorbic acid (VITAMIN C) 500 MG tablet Take 500 mg by mouth daily.   Yes [provider]  aspirin 81 MG tablet Take 81 mg by mouth daily.   Yes [provider]  COAGUCHEK LANCETS [place] 1 each by Does not apply route as directed. 02/14/16  Yes Brunetta Genera, MD  Copper Gluconate (COPPER CAPS) 2 MG CAPS Take by mouth every evening.   Yes [provider]  FEROSUL 325 (65 Fe) MG tablet TAKE 1 TABLET BY MOUTH EVERY DAY 08/24/18  Yes Denita Lung, MD  fish oil-omega-3 fatty acids 1000 MG capsule Take 1 g by mouth daily.   Yes [provider]  Fluticasone Furoate (ARNUITY ELLIPTA) 100 MCG/ACT AEPB Inhale 1 puff into the lungs daily. 02/15/16  Yes Denita Lung, MD  levalbuterol [facility] HFA) 45 MCG/ACT inhaler INHALE 1-2 PUFFS INTO THE LUNGS EVERY 4-6 HOURS AS NEEDED FOR WHEEZING OR SHORTNESS OF BREATH 05/05/18  Yes Denita Lung, MD  metoprolol succinate (TOPROL-XL) 50 MG 24 hr tablet Take 1 tablet (50 mg total) by mouth daily. Take with or immediately following a meal. 08/26/18 08/21/19 Yes Martinique, Peter M, MD  Multiple Vitamins-Minerals (ONE-A-DAY MENS 50+ ADVANTAGE PO) Take by mouth daily.   Yes  [provider]  POLICOSANOL PO Take 20 mg by mouth daily.   Yes [provider]  POLY-IRON 150 150 MG capsule TAKE 1 CAPSULE BY MOUTH EVERY DAY 11/27/17  Yes Denita Lung, MD  PT/INR Test [facility] PT TEST) STRP 1 each by In Vitro route as directed. 02/14/16  Yes Brunetta Genera, MD  PT/INR Testing Monitor (COAGUCHEK XS PLUS SYSTEM) KIT 1 each by Does not apply route as directed. 02/14/16  Yes Brunetta Genera, MD  S-Adenosylmethionine (SAM-E) 400 MG TABS Take 1 tablet by mouth daily.   Yes [provider]  [facility] Wort 300 MG CAPS Take 300 mg by mouth 2 (two) times daily.   Yes [provider]  Thiamine HCl (B-1) 100 MG TABS Take by mouth.   Yes [provider]  warfarin (COUMADIN) 10 MG tablet TAKE 1 TABLET(10 MG) BY MOUTH DAILY 10/16/18  Yes Denita Lung, MD  warfarin (COUMADIN) 2 MG tablet TAKE 1 TABLET BY MOUTH DAILY 01/08/16  Yes Denita Lung, MD  warfarin (COUMADIN) 5 MG tablet Take 1 tablet (5 mg total) by mouth daily. 07/14/18  Yes Denita Lung, MD    ROS:  Out of a complete 14 system review of symptoms, the patient complains only of the following symptoms, and all other reviewed systems are negative.  Ringing in the ears Leg swelling Frequent waking Food allergies Walking difficulty  Blood pressure 117/77, pulse (!) 56, height _0  (1.727 m), weight 207 lb (93.9 kg).  Physical Exam  General: The patient is alert and cooperative at the time of the examination.  Skin: No significant peripheral edema is noted.   Neurologic Exam  Mental status: The patient is alert and oriented x 3 at the time of the examination. The patient has apparent normal recent and remote memory, with an apparently normal attention span and concentration ability.   Cranial nerves: Facial symmetry is present. Speech is normal, no aphasia or dysarthria is noted. Extraocular movements are full. Visual fields are full.  Motor: The patient  has good strength in all 4 extremities.  Sensory examination: Soft touch sensation is symmetric on the face, arms, and legs.  Coordination: The patient has good finger-nose-finger and heel-to-shin bilaterally.  Gait and station: The patient has a normal gait. Tandem gait is normal. Romberg is negative. No drift is seen.  Reflexes: Deep tendon reflexes are symmetric.   MRI lumbar 05/31/18:  IMPRESSION: This MRI of the lumbar spine without contrast shows tentative changes as detailed above. The most significant findings are: 1. At L1-L2, there is a small right paramedian disc protrusion. There is no spinal stenosis or  nerve root compression.  2. At L5-S1, there is 1 to 2 mm of anterolisthesis. There is no nerve root compression or spinal stenosis.  * MRI scan images were reviewed online. I agree with the written report.    Assessment/Plan:  1.  Reflex sympathetic dystrophy, left leg  2.  Acute lumbar strain  The patient will be placed on a prednisone Dosepak 5 mg, 6-day Dosepak.  He will be given a 10-day course of methocarbamol.  The patient will be placed on Keppra 500 mg twice daily for the left leg pain.  The patient be sent for physical therapy for the low back.  He will follow-up here in about 6 months.  We will check nerve conduction studies on both legs, EMG on the left leg.  Jill Alexanders MD 11/26/2018 7:14 PM  [facility] [address] [place], [place] [address]  Phone [phone] Fax [phone]

## 2018-11-26 NOTE — Patient Instructions (Signed)
We will start keppra for the leg pain.

## 2018-11-30 DIAGNOSIS — S0230XD Fracture of orbital floor, unspecified side, subsequent encounter for fracture with routine healing: Secondary | ICD-10-CM | POA: Diagnosis not present

## 2018-11-30 DIAGNOSIS — Z9889 Other specified postprocedural states: Secondary | ICD-10-CM | POA: Diagnosis not present

## 2018-11-30 DIAGNOSIS — H2513 Age-related nuclear cataract, bilateral: Secondary | ICD-10-CM | POA: Diagnosis not present

## 2018-11-30 DIAGNOSIS — H04123 Dry eye syndrome of bilateral lacrimal glands: Secondary | ICD-10-CM | POA: Diagnosis not present

## 2018-12-11 ENCOUNTER — Other Ambulatory Visit: Payer: Self-pay | Admitting: *Deleted

## 2018-12-11 MED ORDER — METOPROLOL SUCCINATE ER 50 MG PO TB24: 50.0000 mg | ORAL_TABLET | Freq: Every day | ORAL | 0 refills | Status: DC

## 2018-12-18 DIAGNOSIS — R634 Abnormal weight loss: Secondary | ICD-10-CM | POA: Insufficient documentation

## 2018-12-22 ENCOUNTER — Encounter: Payer: Self-pay | Admitting: Adult Health

## 2018-12-22 ENCOUNTER — Ambulatory Visit (INDEPENDENT_AMBULATORY_CARE_PROVIDER_SITE_OTHER): Payer: Medicare Other | Admitting: Adult Health

## 2018-12-22 VITALS — BP 110/76 | HR 59 | Ht 68.5 in | Wt 212.0 lb

## 2018-12-22 DIAGNOSIS — I483 Typical atrial flutter: Secondary | ICD-10-CM

## 2018-12-22 DIAGNOSIS — I10 Essential (primary) hypertension: Secondary | ICD-10-CM | POA: Diagnosis not present

## 2018-12-22 DIAGNOSIS — Z952 Presence of prosthetic heart valve: Secondary | ICD-10-CM

## 2018-12-22 MED ORDER — METOPROLOL SUCCINATE ER 50 MG PO TB24: 50.0000 mg | ORAL_TABLET | Freq: Every day | ORAL | 3 refills | Status: DC

## 2018-12-22 NOTE — Progress Notes (Signed)
Cardiology Office Note   Date:  12/22/2018   ID:  Frederick Payne, DOB 10-12-1961, MRN [id number]  PCP:  Ronnald Nian, MD  Cardiologist: Dr.Jordan  Chief Complaint  Patient presents with  . Follow-up  . Atrial Fibrillation     History of Present Illness: Frederick Payne is a 58 y.o. male who presents for ongoing assessment and management of aortic valve disease, s/p AVR with Bental procedure, atrial flutter. He has Leiden 5 deficiency and has problems with hypercoagulability. He is on coumadin managed by Dr.Lalonde.   He has had a previous DVT to his left leg, with ischemic neuropathy and left leg weakness, causing him to depend on a cane for ambulation. He has IVC filter in place due to 3 separate episodes of PE's .   He had and aortic valve replacement in July of 2014 with St. Jude mechanical valve along with single vessel CABG.  He was admitted to [facility] with typical atrial flutter in 01/2016 and was cardioverted. The patient reported that he was binge drinking at the time. Repeat cath done at that time demonstrated nonobstructive CAD.   He is followed in our Afib clinic, with Holter monitor revealing PAC's and PVC's, no Afib or flutter. He has had other ED visits for recurrent chest pain, hyponatremia, and acidosis, related to drinking heavily. He apparently has abstained from ETOH since last ER visit in September of 2019   He states he has been doing well and is here for refills on his metoprolol. He has been taking 50 mg tablets instead of 75 mg tablets as directed.Since he stopped drinking his heart has been less irritable.   He is managing his own coumadin with a machine he has at home. He has not seen Dr. Susann Givens in several months.   Past Medical History:  Diagnosis Date  . Aortic stenosis    Had surgery performed  . CAD (coronary artery disease)   . Chronic anemia   . Deep vein thrombosis (HCC) 2006   After a broken ankle--recently diagnosed with reflex  sympathetic dystrophy of the left lower extremity  . Elevated LFTs    h/o excessive alcohol  . Factor V Leiden (HCC)   . Hypertension   . Ischemic heart disease   . Psychiatric disorder    Underlying  . PUD (peptic ulcer disease)    with bleeding  . Reflex sympathetic dystrophy     Past Surgical History:  Procedure Laterality Date  . AORTIC VALVE REPLACEMENT  07/15/12   27 mm St Jude  . CORONARY ARTERY BYPASS GRAFT     x1  . GALLBLADDER SURGERY  2008  . gastrectomy and vagotomy    . greenfield filter       Current Outpatient Medications  Medication Sig Dispense Refill  . albuterol (PROVENTIL HFA;VENTOLIN HFA) 108 (90 Base) MCG/ACT inhaler 2 puffs every 4-6 hours as needed 1 Inhaler 0  . ascorbic acid (VITAMIN C) 500 MG tablet Take 500 mg by mouth daily.    [name] aspirin 81 MG tablet Take 81 mg by mouth daily.    Dewaine Conger LANCETS MISC 1 each by Does not apply route as directed. 200 each 1  . Copper Gluconate (COPPER CAPS) 2 MG CAPS Take by mouth every evening.    [name] FEROSUL 325 (65 Fe) MG tablet TAKE 1 TABLET BY MOUTH EVERY DAY 90 tablet 0  . fish oil-omega-3 fatty acids 1000 MG capsule Take 1 g by mouth daily.    [name]  Fluticasone Furoate (ARNUITY ELLIPTA) 100 MCG/ACT AEPB Inhale 1 puff into the lungs daily. 30 each 11  . levalbuterol (XOPENEX HFA) 45 MCG/ACT inhaler INHALE 1-2 PUFFS INTO THE LUNGS EVERY 4-6 HOURS AS NEEDED FOR WHEEZING OR SHORTNESS OF BREATH 75 g 0  . metoprolol succinate (TOPROL-XL) 50 MG 24 hr tablet Take 1 tablet (50 mg total) by mouth daily. NEED OV. 30 tablet 0  . Multiple Vitamins-Minerals (ONE-A-DAY MENS 50+ ADVANTAGE PO) Take by mouth daily.    [name] POLICOSANOL PO Take 20 mg by mouth daily.    [name] POLY-IRON 150 150 MG capsule TAKE 1 CAPSULE BY MOUTH EVERY DAY 90 capsule 3  . predniSONE (DELTASONE) 5 MG tablet Begin taking 6 tablets daily, taper by one tablet daily until off the medication. 21 tablet 0  . PT/INR Test (COAGUCHEK PT TEST) STRP 1 each by In Vitro  route as directed. 48 each 1  . PT/INR Testing Monitor (COAGUCHEK XS PLUS SYSTEM) KIT 1 each by Does not apply route as directed. 1 kit 0  . S-Adenosylmethionine (SAM-E) 400 MG TABS Take 1 tablet by mouth daily.    . [place] Wort 300 MG CAPS Take 300 mg by mouth 2 (two) times daily.    . Thiamine HCl (B-1) 100 MG TABS Take by mouth.    . warfarin (COUMADIN) 10 MG tablet TAKE 1 TABLET(10 MG) BY MOUTH DAILY 90 tablet 0  . warfarin (COUMADIN) 2 MG tablet TAKE 1 TABLET BY MOUTH DAILY 30 tablet 2  . warfarin (COUMADIN) 5 MG tablet Take 1 tablet (5 mg total) by mouth daily. 90 tablet 1   No current facility-administered medications for this visit.     Allergies:   Benzodiazepines; Contrast media [iodinated diagnostic agents]; Iodine; Other; Pectin; Shellfish allergy; Sulfa antibiotics; Diazepam; and Lactose intolerance (gi)    Social History:  The patient  reports that he has quit smoking. He has never used smokeless tobacco. He reports that he does not drink alcohol or use drugs.   Family History:  The patient's family history includes Healthy in his father; Stroke in his paternal grandmother.    ROS: All other systems are reviewed and negative. Unless otherwise mentioned in H&P    PHYSICAL EXAM: VS:  Ht 5' 8.5" (1.74 m)   Wt 212 lb (96.2 kg)   BMI 31.77 kg/m  , BMI Body mass index is 31.77 kg/m. GEN: Well nourished, well developed, in no acute distress HEENT: normal Neck: no JVD, carotid bruits, or masses Cardiac: RRR; crisp valve sound, rubs, or gallops,no edema  Respiratory:  Clear to auscultation bilaterally, normal work of breathing GI: soft, nontender, nondistended, + BS MS: no deformity or atrophy Skin: warm and dry, no rash Neuro:  Strength and sensation are intact.Excessive blinking,  Psych: euthymic mood, full affect   EKG:  Sinus bradycardia, rate of 59 bpm.  Recent Labs: 12/29/2017: ALT 25; BUN 12; Creatinine, Ser 0.87; Hemoglobin 14.6; Platelets 147; Potassium 4.5;  Sodium 140    Lipid Panel    Component Value Date/Time   CHOL 191 12/29/2017 1455   TRIG 206 (H) 12/29/2017 1455   HDL 34 (L) 12/29/2017 1455   CHOLHDL 5.6 (H) 12/29/2017 1455   LDLCALC 116 (H) 12/29/2017 1455      Wt Readings from Last 3 Encounters:  12/22/18 212 lb (96.2 kg)  11/26/18 207 lb (93.9 kg)  07/14/18 203 lb (92.1 kg)      Other studies Reviewed: Holter monitor 2016/05/21: Study Highlights  Normal sinus rhythm  PVCs- frequent with some trigeminy and couplets.  Very rare PACs.   Echo 11/13/15: Study Conclusions  - Left ventricle: The cavity size was normal. Wall thickness was normal. Systolic function was normal. The estimated ejection fraction was in the range of 55% to 60%. - Aortic valve: Normal appearing mechanical AVR with stable trivial peri valvular regurgitation. Valve area (VTI): 1.74 cm^2. Valve area (Vmax): 1.8 cm^2. Valve area (Vmean): 1.71 cm^2. - Left atrium: The atrium was mildly dilated. - Atrial septum: No defect or patent foramen ovale was identified.   Cardiac cath 01/26/16:RESULTS:   Coronary arteriograms and bypass graft angiogram 1. Left main- normal 2. Left anterior descending- 30% proximal; D1- normal 3. Left circumflex- 20% mid; OM1- 20% proximal 4. Right coronary- dominant vessel which is normal 5. Left internal mammary artery bypass graft LAD is widely patent.   CONCLUSIONS:  1. Two-vessel nonobstructive native coronary disease. 2. LIMA to LAD is widely patent. 3. Since she mechanical valve demonstrates normal opening under fluoroscopy.   Myoview 04/26/17:  IMPRESSION: Ejection fraction calculates to 60%. No ischemia  Result Narrative  INDICATION: chest pain  TECHNIQUE: NM HEART SPECT PH STRESS (R). Comparison none. 13 mCi technetium Cardiolite administered for the rest portion of the study and 36 mCi for the stress portion of the study. SPECT images were obtained.  FINDINGS: Ejection fraction calculates  to 60%. End-diastolic volume is 78 mL and systolic volume is 31 mL. Normal wall motion. No ischemia    ASSESSMENT AND PLAN:  1. PAF: No further episodes of irregular HR. He is taking lower dose of metoprolol 50 mg instead of 75 mg daily. This appears to be a good dose for him. I will refill the metoprolol for 50 mg daily.   2. S/P Mechanical AoV: He continues on coumadin. I have counseled him on the need to be seen by physician, provider, or pharmacist for dosage recommendations and not dose himself. This is a dangerous practice and can be life threatening if not done correctly with follow up labs as well. He verbalized understanding. He will need repeat echo next year.  3. DVT: Continues on coumadin. See above. Has IVC filter.   4. Hypertension: BP is well controlled.   Current medicines are reviewed at length with the patient today.    Labs/ tests ordered today include: None  Bettey Mare. Liborio Nixon, ANP, AACC   12/22/2018 1:37 PM    [facility] [address] Office [phone] Fax [phone]

## 2018-12-22 NOTE — Patient Instructions (Signed)
Follow-Up: You will need a follow up appointment in 12 months.  Please call our office 2 months in advance(DECEMBER 2020)  to schedule this(FEB 2021)  appointment.  You may see Peter Martinique, MD or one of the following Advanced Practice Providers on your designated Care Team:  [name], PA-C  Fabian Sharp, [place]     Medication Instructions:  NO CHANGES- Your physician recommends that you continue on your current medications as directed. Please refer to the Current Medication list given to you today. If you need a refill on your cardiac medications before your next appointment, please call your pharmacy. Labwork: When you have labs (blood work) and your tests are completely normal, you will receive your results ONLY by [place] (if you have MyChart) -OR- A paper copy in the mail.  At [facility], you and your health needs are our priority.  As part of our continuing mission to provide you with exceptional heart care, we have created designated Provider Care Teams.  These Care Teams include your primary Cardiologist (physician) and Advanced Practice Providers (APPs -  Physician Assistants and Nurse Practitioners) who all work together to provide you with the care you need, when you need it.  Thank you for choosing [facility] at [facility]!!

## 2018-12-28 ENCOUNTER — Other Ambulatory Visit: Payer: Self-pay | Admitting: Family Medicine

## 2018-12-28 NOTE — Telephone Encounter (Signed)
[facility] is requesting to fill pt ferosul . Please advise [facility]

## 2019-01-04 ENCOUNTER — Ambulatory Visit (INDEPENDENT_AMBULATORY_CARE_PROVIDER_SITE_OTHER): Payer: Medicare Other | Admitting: Neurology

## 2019-01-04 ENCOUNTER — Encounter: Payer: Self-pay | Admitting: Neurology

## 2019-01-04 DIAGNOSIS — G90522 Complex regional pain syndrome I of left lower limb: Secondary | ICD-10-CM | POA: Diagnosis not present

## 2019-01-04 NOTE — Procedures (Signed)
     HISTORY:  Frederick Payne is a 58 year old patient with a history of chronic left leg and back pain that has been felt to be associated with a complex regional pain syndrome.  He has had a MRI of the back done in August 2019, he comes in for EMG evaluation for possible lumbar radiculopathy.  The MRI did not show evidence of nerve root compression.  NERVE CONDUCTION STUDIES:  Nerve conduction studies were performed on both lower extremities. The distal motor latencies and motor amplitudes for the peroneal and posterior tibial nerves were within normal limits. The nerve conduction velocities for these nerves were also normal. The sensory latencies for the peroneal and sural nerves were within normal limits. The F wave latencies for the posterior tibial nerves were within normal limits.   EMG STUDIES:  EMG study was performed on the left lower extremity:  The tibialis anterior muscle reveals 2 to 4K motor units with full recruitment. No fibrillations or positive waves were seen. The peroneus tertius muscle reveals 2 to 4K motor units with full recruitment. No fibrillations or positive waves were seen. The medial gastrocnemius muscle reveals 1 to 3K motor units with full recruitment. No fibrillations or positive waves were seen. The vastus lateralis muscle reveals 2 to 4K motor units with full recruitment. No fibrillations or positive waves were seen. The iliopsoas muscle reveals 2 to 4K motor units with full recruitment. No fibrillations or positive waves were seen. The biceps femoris muscle (long head) reveals 2 to 4K motor units with full recruitment. No fibrillations or positive waves were seen. The lumbosacral paraspinal muscles were tested at 3 levels, and revealed no abnormalities of insertional activity at all 3 levels tested. There was good relaxation.   IMPRESSION:  Nerve conduction studies done on both lower extremities were within normal limits, no evidence of a neuropathy is  seen.  EMG evaluation of the left lower extremity was unremarkable, there is no evidence of an overlying lumbosacral radiculopathy.  Jill Alexanders MD 01/04/2019 9:17 AM  [facility] [address] [place], [place] [address]  Phone [phone] Fax [phone]

## 2019-01-04 NOTE — Progress Notes (Signed)
Please refer to EMG and nerve conduction procedure note.  

## 2019-01-04 NOTE — Progress Notes (Addendum)
Patient comes in today for EMG nerve conduction study evaluation.  He gained benefit with the use of steroids, he never took [place] or the muscle relaxant.  He started having pain back again once the Keppra was discontinued.  In the past he has been to a pain center, but he was discharged from the pain center when he refused to take the opiate medications, he does not wish to be on pills for his pain, he wishes to have a definitive treatment.  I will set up a consultation with Dr. Nicholaus Bloom or 1 of his associates.      [place]    Nerve / Sites Muscle Latency Ref. Amplitude Ref. Rel Amp Segments Distance Velocity Ref. Area    ms ms mV mV %  cm m/s m/s mVms  R Peroneal - EDB     Ankle EDB 4.3 ?6.5 5.0 ?2.0 100 Ankle - EDB 9   18.6     Fib head EDB 10.5  4.4  87.7 Fib head - Ankle 31 50 ?44 16.7     Pop fossa EDB 12.6  4.0  92 Pop fossa - Fib head 10 48 ?44 16.0         Pop fossa - Ankle      L Peroneal - EDB     Ankle EDB 4.4 ?6.5 4.4 ?2.0 100 Ankle - EDB 9   18.0     Fib head EDB 10.3  3.9  88 Fib head - Ankle 28 47 ?44 16.2     Pop fossa EDB 12.6  3.7  95.7 Pop fossa - Fib head 10 45 ?44 15.8         Pop fossa - Ankle      R Tibial - AH     Ankle AH 3.1 ?5.8 5.3 ?4.0 100 Ankle - AH 9   11.2     Pop fossa AH 11.8  4.2  79 Pop fossa - Ankle 39 45 ?41 8.6  L Tibial - AH     Ankle AH 4.8 ?5.8 7.7 ?4.0 100 Ankle - AH 9   18.0     Pop fossa AH 12.9  5.2  67.5 Pop fossa - Ankle 39 48 ?41 17.3             SNC    Nerve / Sites Rec. Site Peak Lat Ref.  Amp Ref. Segments Distance    ms ms V V  cm  R Sural - Ankle (Calf)     Calf Ankle 3.5 ?4.4 7 ?6 Calf - Ankle 14  L Sural - Ankle (Calf)     Calf Ankle 3.5 ?4.4 7 ?6 Calf - Ankle 14  R Superficial peroneal - Ankle     Lat leg Ankle 3.5 ?4.4 5 ?6 Lat leg - Ankle 14  L Superficial peroneal - Ankle     Lat leg Ankle 3.8 ?4.4 6 ?6 Lat leg - Ankle 14              F  Wave    Nerve F Lat Ref.   ms ms  R Tibial - AH 51.9 ?56.0  L  Tibial - AH 51.4 ?56.0

## 2019-02-04 ENCOUNTER — Other Ambulatory Visit: Payer: Self-pay | Admitting: Family Medicine

## 2019-02-04 NOTE — Telephone Encounter (Signed)
Is this okay to refill? 

## 2019-03-10 ENCOUNTER — Other Ambulatory Visit: Payer: Self-pay | Admitting: Family Medicine

## 2019-03-10 MED ORDER — AMOXICILLIN 500 MG PO TABS: ORAL_TABLET | ORAL | 0 refills | Status: DC

## 2019-03-10 NOTE — Progress Notes (Signed)
He has a dental procedure today. Amoxil called in

## 2019-03-27 ENCOUNTER — Other Ambulatory Visit: Payer: Self-pay | Admitting: Family Medicine

## 2019-03-27 DIAGNOSIS — Z7901 Long term (current) use of anticoagulants: Secondary | ICD-10-CM

## 2019-03-27 DIAGNOSIS — D6851 Activated protein C resistance: Secondary | ICD-10-CM

## 2019-04-01 ENCOUNTER — Encounter: Payer: Self-pay | Admitting: Family Medicine

## 2019-04-01 ENCOUNTER — Ambulatory Visit (INDEPENDENT_AMBULATORY_CARE_PROVIDER_SITE_OTHER): Payer: Medicare Other | Admitting: Family Medicine

## 2019-04-01 ENCOUNTER — Other Ambulatory Visit: Payer: Self-pay

## 2019-04-01 VITALS — BP 110/70 | HR 54 | Temp 97.8°F | Wt 217.4 lb

## 2019-04-01 DIAGNOSIS — Z1159 Encounter for screening for other viral diseases: Secondary | ICD-10-CM

## 2019-04-01 DIAGNOSIS — F325 Major depressive disorder, single episode, in full remission: Secondary | ICD-10-CM

## 2019-04-01 DIAGNOSIS — E538 Deficiency of other specified B group vitamins: Secondary | ICD-10-CM | POA: Diagnosis not present

## 2019-04-01 DIAGNOSIS — I2510 Atherosclerotic heart disease of native coronary artery without angina pectoris: Secondary | ICD-10-CM

## 2019-04-01 DIAGNOSIS — Z86711 Personal history of pulmonary embolism: Secondary | ICD-10-CM

## 2019-04-01 DIAGNOSIS — K709 Alcoholic liver disease, unspecified: Secondary | ICD-10-CM

## 2019-04-01 DIAGNOSIS — F1011 Alcohol abuse, in remission: Secondary | ICD-10-CM

## 2019-04-01 DIAGNOSIS — J45909 Unspecified asthma, uncomplicated: Secondary | ICD-10-CM

## 2019-04-01 DIAGNOSIS — E785 Hyperlipidemia, unspecified: Secondary | ICD-10-CM

## 2019-04-01 DIAGNOSIS — J309 Allergic rhinitis, unspecified: Secondary | ICD-10-CM

## 2019-04-01 DIAGNOSIS — F1121 Opioid dependence, in remission: Secondary | ICD-10-CM

## 2019-04-01 DIAGNOSIS — D649 Anemia, unspecified: Secondary | ICD-10-CM

## 2019-04-01 DIAGNOSIS — G90522 Complex regional pain syndrome I of left lower limb: Secondary | ICD-10-CM

## 2019-04-01 DIAGNOSIS — Z5181 Encounter for therapeutic drug level monitoring: Secondary | ICD-10-CM

## 2019-04-01 DIAGNOSIS — Z7901 Long term (current) use of anticoagulants: Secondary | ICD-10-CM

## 2019-04-01 DIAGNOSIS — D682 Hereditary deficiency of other clotting factors: Secondary | ICD-10-CM

## 2019-04-01 DIAGNOSIS — Z951 Presence of aortocoronary bypass graft: Secondary | ICD-10-CM

## 2019-04-01 LAB — LIPID PANEL

## 2019-04-01 MED ORDER — ATORVASTATIN CALCIUM 20 MG PO TABS: 20.0000 mg | ORAL_TABLET | Freq: Every day | ORAL | 3 refills | Status: DC

## 2019-04-01 MED ORDER — MOMETASONE FUROATE 220 MCG/INH IN AEPB: 2.0000 | INHALATION_SPRAY | Freq: Every day | RESPIRATORY_TRACT | 12 refills | Status: DC

## 2019-04-01 NOTE — Progress Notes (Signed)
Subjective:    Patient ID: Frederick Payne, male    DOB: 1960/11/22, 58 y.o.   MRN: [id number]  HPI He is here for medication management visit.  He continues to monitor his Coumadin levels at home.  His eating habits have remained fairly stable.  He keeps the numbers between 2.5 and 3.5.  He is no longer drinking.  He still having difficulty with leg pain from RSD.  He is now considering having a stimulator put in by Dr. Vear Clock.  He does have a previous history of opioid addiction.  He also has underlying allergies and asthma and has been using an inhaler daily.  Psychologically he says that he is in a good frame of mind.  Does have an underlying history of factor V Leiden as well as history of PE.  Review of the record also indicates a vitamin B12 deficiency as well as disorder of copper metabolism and he is on supplements for this.   Review of Systems     Objective:   Physical Exam Alert and in no distress.  Sclera appear normal.  Tympanic membranes and canals are normal. Pharyngeal area is normal. Neck is supple without adenopathy or thyromegaly. Cardiac exam shows a regular sinus rhythm without murmurs or gallops. Lungs are clear to auscultation.        Assessment & Plan:  Anticoagulation goal of INR 2.5 to 3.5  B12 deficiency - Plan: Vitamin B12  Atherosclerosis of coronary artery of native heart, angina presence unspecified, unspecified vessel or lesion type  Disorder of copper metabolism - Plan: Copper, Serum, CANCELED: Copper, Blood  Personal history of pulmonary embolism  Complex regional pain syndrome type 1 of left lower extremity  Allergic rhinitis, unspecified seasonality, unspecified trigger  Asthma with allergic rhinitis without complication, unspecified asthma severity - Plan: mometasone (ASMANEX, 60 METERED DOSES,) 220 MCG/INH inhaler  Alcoholic liver disease (HCC) - Plan: CBC with Differential/Platelet, Comprehensive metabolic panel, Lipid panel  Anemia,  unspecified type  Opioid dependence in remission (HCC)  History of alcohol abuse  Need for hepatitis C screening test - Plan: Hepatitis C antibody  Depression, major, in remission (HCC)  Factor V deficiency (HCC)  S/P CABG x 1 - Plan: CBC with Differential/Platelet, Comprehensive metabolic panel, Lipid panel  Hyperlipidemia, unspecified hyperlipidemia type - Plan: Lipid panel  I explained that he is now having symptoms of asthma and needs to get this under better control.  I will place him on Asmanex to try and diminish the need for albuterol.  Explained that albuterol should not be used more than twice per week to be under good control. He will follow-up getting the stimulator for his RSD. Continue to check his PT/INR. Continue on his other medications. Lipitor was called in. Over 45 minutes spent, greater than 50% in coordination of care.

## 2019-04-02 LAB — COMPREHENSIVE METABOLIC PANEL
ALT: 50 IU/L — ABNORMAL HIGH (ref 0–44)
AST: 45 IU/L — ABNORMAL HIGH (ref 0–40)
Albumin/Globulin Ratio: 1.5 (ref 1.2–2.2)
Albumin: 4.1 g/dL (ref 3.8–4.9)
Alkaline Phosphatase: 40 IU/L (ref 39–117)
BUN/Creatinine Ratio: 10 (ref 9–20)
BUN: 10 mg/dL (ref 6–24)
Bilirubin Total: 0.4 mg/dL (ref 0.0–1.2)
CO2: 22 mmol/L (ref 20–29)
Calcium: 9.7 mg/dL (ref 8.7–10.2)
Chloride: 105 mmol/L (ref 96–106)
Creatinine, Ser: 0.97 mg/dL (ref 0.76–1.27)
GFR calc Af Amer: 100 mL/min/{1.73_m2} (ref >59)
GFR calc non Af Amer: 86 mL/min/{1.73_m2} (ref >59)
Globulin, Total: 2.7 g/dL (ref 1.5–4.5)
Glucose: 102 mg/dL — ABNORMAL HIGH (ref 65–99)
Potassium: 4.7 mmol/L (ref 3.5–5.2)
Sodium: 141 mmol/L (ref 134–144)
Total Protein: 6.8 g/dL (ref 6.0–8.5)

## 2019-04-02 LAB — CBC WITH DIFFERENTIAL/PLATELET
Basophils Absolute: 0 10*3/uL (ref 0.0–0.2)
Basos: 1 %
EOS (ABSOLUTE): 0.1 10*3/uL (ref 0.0–0.4)
Eos: 3 %
Hematocrit: 42.3 % (ref 37.5–51.0)
Hemoglobin: 14.8 g/dL (ref 13.0–17.7)
Immature Grans (Abs): 0 10*3/uL (ref 0.0–0.1)
Immature Granulocytes: 0 %
Lymphocytes Absolute: 1 10*3/uL (ref 0.7–3.1)
Lymphs: 28 %
MCH: 30.6 pg (ref 26.6–33.0)
MCHC: 35 g/dL (ref 31.5–35.7)
MCV: 88 fL (ref 79–97)
Monocytes Absolute: 0.3 10*3/uL (ref 0.1–0.9)
Monocytes: 9 %
Neutrophils Absolute: 2.2 10*3/uL (ref 1.4–7.0)
Neutrophils: 59 %
Platelets: 143 10*3/uL — ABNORMAL LOW (ref 150–450)
RBC: 4.83 x10E6/uL (ref 4.14–5.80)
RDW: 14.1 % (ref 11.6–15.4)
WBC: 3.6 10*3/uL (ref 3.4–10.8)

## 2019-04-02 LAB — LIPID PANEL
Chol/HDL Ratio: 5.3 ratio — ABNORMAL HIGH (ref 0.0–5.0)
Cholesterol, Total: 207 mg/dL — ABNORMAL HIGH (ref 100–199)
HDL: 39 mg/dL — ABNORMAL LOW (ref >39)
LDL Calculated: 137 mg/dL — ABNORMAL HIGH (ref 0–99)
Triglycerides: 153 mg/dL — ABNORMAL HIGH (ref 0–149)
VLDL Cholesterol Cal: 31 mg/dL (ref 5–40)

## 2019-04-02 LAB — HEPATITIS C ANTIBODY: Hep C Virus Ab: <0.1 s/co ratio (ref 0.0–0.9)

## 2019-04-02 LAB — VITAMIN B12: Vitamin B-12: >2000 pg/mL — ABNORMAL HIGH (ref 232–1245)

## 2019-04-05 ENCOUNTER — Other Ambulatory Visit: Payer: Self-pay | Admitting: Family Medicine

## 2019-04-05 NOTE — Telephone Encounter (Signed)
Is this okay to refill? 

## 2019-04-06 LAB — COPPER, SERUM: Copper: 75 ug/dL (ref 72–166)

## 2019-04-08 ENCOUNTER — Telehealth: Payer: Self-pay | Admitting: Family Medicine

## 2019-04-08 NOTE — Telephone Encounter (Signed)
P.A. ASMANEX

## 2019-04-10 NOTE — Telephone Encounter (Signed)
P.A. approved, called pharmacy went thru for $236

## 2019-04-22 ENCOUNTER — Telehealth: Payer: Self-pay | Admitting: Family Medicine

## 2019-04-22 DIAGNOSIS — J4520 Mild intermittent asthma, uncomplicated: Secondary | ICD-10-CM

## 2019-04-22 NOTE — Telephone Encounter (Signed)
P.A. ASMANEX was approved but cost $236.  Preferred are Flovent Diskus or Annuity Ellipta.  Pt is ok with switching to either, said he has tried both in the past.  Can you switch pt to your preferred?

## 2019-04-23 MED ORDER — ARNUITY ELLIPTA 100 MCG/ACT IN AEPB: 1.0000 | INHALATION_SPRAY | Freq: Every day | RESPIRATORY_TRACT | 11 refills | Status: DC

## 2019-04-25 ENCOUNTER — Other Ambulatory Visit: Payer: Self-pay | Admitting: Family Medicine

## 2019-05-03 ENCOUNTER — Ambulatory Visit (INDEPENDENT_AMBULATORY_CARE_PROVIDER_SITE_OTHER): Payer: Medicare Other | Admitting: Family Medicine

## 2019-05-03 ENCOUNTER — Encounter: Payer: Self-pay | Admitting: Family Medicine

## 2019-05-03 ENCOUNTER — Other Ambulatory Visit: Payer: Self-pay

## 2019-05-03 VITALS — Wt 217.0 lb

## 2019-05-03 DIAGNOSIS — E785 Hyperlipidemia, unspecified: Secondary | ICD-10-CM

## 2019-05-03 DIAGNOSIS — J45909 Unspecified asthma, uncomplicated: Secondary | ICD-10-CM

## 2019-05-03 DIAGNOSIS — Z7901 Long term (current) use of anticoagulants: Secondary | ICD-10-CM | POA: Diagnosis not present

## 2019-05-03 DIAGNOSIS — Z86711 Personal history of pulmonary embolism: Secondary | ICD-10-CM

## 2019-05-03 DIAGNOSIS — G90522 Complex regional pain syndrome I of left lower limb: Secondary | ICD-10-CM

## 2019-05-03 DIAGNOSIS — I2510 Atherosclerotic heart disease of native coronary artery without angina pectoris: Secondary | ICD-10-CM

## 2019-05-03 NOTE — Progress Notes (Signed)
Subjective:    Patient ID: Frederick Payne, male    DOB: 1961/07/28, 58 y.o.   MRN: [id number]  HPI Documentation for virtual telephone encounter.  Documentation for virtual audio and video telecommunications through doximity encounter:The patient was located at home. The provider was located in the office. The patient did consent to this visit and is aware of possible charges through their insurance for this visit. The other persons participating in this telemedicine service were none. Time spent on call was 5 minutes and in review of previous records >10 minutes total.  This virtual service is not related to other E/M service within previous 7 days. He is now using the Asmanex on a regular basis and notes that it has improved his energy level and he is only using albuterol once or twice per week.  He is scheduled to get a stimulator for his RSD in August from Dr. Vear Clock.  His last PT/INR was 3.4.  He is taking Lipitor and having no difficulty with that medication.  He has been on it now steadily for a month.    Review of Systems     Objective:   Physical Exam Alert and in no distress otherwise not examined       Assessment & Plan:   Encounter Diagnoses  Name Primary?  . Complex regional pain syndrome type 1 of left lower extremity Yes  . Personal history of pulmonary embolism   . Long term (current) use of anticoagulants   . Asthma with allergic rhinitis without complication, unspecified asthma severity   He will follow-up with Dr. Vear Clock as mentioned above.  Continue on his Coumadin dosing.  Continue on his asthma medication.  I will have blood work drawn and 2 months to check his lipid status.

## 2019-05-27 ENCOUNTER — Other Ambulatory Visit: Payer: Self-pay

## 2019-05-27 ENCOUNTER — Encounter: Payer: Self-pay | Admitting: Neurology

## 2019-05-27 ENCOUNTER — Ambulatory Visit (INDEPENDENT_AMBULATORY_CARE_PROVIDER_SITE_OTHER): Payer: Medicare Other | Admitting: Neurology

## 2019-05-27 VITALS — BP 116/72 | HR 53 | Temp 98.2°F | Ht 68.0 in | Wt 217.0 lb

## 2019-05-27 DIAGNOSIS — G90522 Complex regional pain syndrome I of left lower limb: Secondary | ICD-10-CM

## 2019-05-27 DIAGNOSIS — I2510 Atherosclerotic heart disease of native coronary artery without angina pectoris: Secondary | ICD-10-CM | POA: Diagnosis not present

## 2019-05-27 NOTE — Progress Notes (Signed)
Reason for visit: Left leg pain, complex regional pain syndrome  Frederick Payne is an 58 y.o. male  History of present illness:  Frederick Payne is a 58 year old right-handed white male with a history of chronic left leg pain with hypersensitivity to light touch that has been persistent over several years.  MRI of the lumbar spine has not shown an etiology for his pain, EMG and nerve conduction study done in March 2020 was unremarkable.  The patient is felt to have a complex regional pain syndrome affecting the left leg.  He has been set up to see Dr. Nicholaus Bloom, he will be seen next week.  The patient has not undergone any medications for the discomfort, he wishes to have a definitive treatment for the pain.  He indicates that he will fall on occasion, the last fall was 2 weeks ago.  He may stand up from a seated position and get a sudden jab of pain in the back and down the left leg that takes him to the ground.  He may have some bilateral hip pain when he rolls over in bed at night.  Otherwise, he sleeps fairly well at night.  He returns for an evaluation.  Past Medical History:  Diagnosis Date  . Aortic stenosis    Had surgery performed  . CAD (coronary artery disease)   . Chronic anemia   . Deep vein thrombosis ([place]) 2006   After a broken ankle--recently diagnosed with reflex sympathetic dystrophy of the left lower extremity  . Elevated LFTs    h/o excessive alcohol  . Factor V Leiden ([place])   . Hypertension   . Ischemic heart disease   . Psychiatric disorder    Underlying  . PUD (peptic ulcer disease)    with bleeding  . Reflex sympathetic dystrophy     Past Surgical History:  Procedure Laterality Date  . AORTIC VALVE REPLACEMENT  07/15/12   27 mm [employer]  . CORONARY ARTERY BYPASS GRAFT     x1  . GALLBLADDER SURGERY  2008  . gastrectomy and vagotomy    . greenfield filter      Family History  Problem Relation Age of Onset  . Healthy Father   . Stroke Paternal  Grandmother     Social history:  reports that he has quit smoking. He has never used smokeless tobacco. He reports that he does not drink alcohol or use drugs.    Allergies  Allergen Reactions  . Benzodiazepines Other (See Comments)    Causes pt to get violent   . Contrast Media [Iodinated Diagnostic Agents] Hives  . Iodine Anaphylaxis  . Other Anaphylaxis    FRUITS FROM TREES.   . Pectin Anaphylaxis  . Shellfish Allergy Anaphylaxis  . Sulfa Antibiotics Anaphylaxis  . Diazepam Other (See Comments)    Headache, strange behavior, memory loss  . Lactose Intolerance (Gi) Nausea And Vomiting    Medications:  Prior to Admission medications   Medication Sig Start Date End Date Taking? Authorizing Provider  albuterol (VENTOLIN HFA) 108 (90 Base) MCG/ACT inhaler INHALE 2 PUFFS BY MOUTH EVERY 4 TO 6 HOURS AS NEEDED 04/05/19  Yes Denita Lung, MD  ascorbic acid (VITAMIN C) 500 MG tablet Take 500 mg by mouth daily.   Yes [provider]  aspirin 81 MG tablet Take 81 mg by mouth daily.   Yes [provider]  COAGUCHEK LANCETS [place] 1 each by Does not apply route as directed. 02/14/16  Yes  Brunetta Genera, MD  Copper Gluconate (COPPER CAPS) 2 MG CAPS Take by mouth every evening.   Yes [provider]  FEROSUL 325 (65 Fe) MG tablet TAKE 1 TABLET BY MOUTH EVERY DAY 04/26/19  Yes Denita Lung, MD  fish oil-omega-3 fatty acids 1000 MG capsule Take 1 g by mouth daily.   Yes [provider]  Fluticasone Furoate (ARNUITY ELLIPTA) 100 MCG/ACT AEPB Inhale 1 puff into the lungs daily. 04/23/19  Yes Denita Lung, MD  levalbuterol [facility] HFA) 45 MCG/ACT inhaler INHALE 1-2 PUFFS INTO THE LUNGS EVERY 4-6 HOURS AS NEEDED FOR WHEEZING OR SHORTNESS OF BREATH 05/05/18  Yes Denita Lung, MD  metoprolol succinate (TOPROL-XL) 50 MG 24 hr tablet Take 1 tablet (50 mg total) by mouth daily. 12/22/18 12/17/19 Yes Lendon Colonel, NP  mometasone (ASMANEX, 60 METERED  DOSES,) 220 MCG/INH inhaler Inhale 2 puffs into the lungs daily. 04/01/19  Yes Denita Lung, MD  Multiple Vitamins-Minerals (ONE-A-DAY MENS 50+ ADVANTAGE PO) Take by mouth daily.   Yes [provider]  POLICOSANOL PO Take 20 mg by mouth daily.   Yes [provider]  POLY-IRON 150 150 MG capsule TAKE 1 CAPSULE BY MOUTH EVERY DAY 02/04/19  Yes Denita Lung, MD  PT/INR Test [facility] PT TEST) STRP 1 each by In Vitro route as directed. 02/14/16  Yes Brunetta Genera, MD  PT/INR Testing Monitor (COAGUCHEK XS PLUS SYSTEM) KIT 1 each by Does not apply route as directed. 02/14/16  Yes Brunetta Genera, MD  S-Adenosylmethionine (SAM-E) 400 MG TABS Take 1 tablet by mouth daily.   Yes [provider]  [facility] Wort 300 MG CAPS Take 300 mg by mouth 2 (two) times daily.   Yes [provider]  Thiamine HCl (B-1) 100 MG TABS Take by mouth.   Yes [provider]  warfarin (COUMADIN) 10 MG tablet TAKE 1 TABLET(10 MG) BY MOUTH DAILY 03/29/19  Yes Denita Lung, MD  warfarin (COUMADIN) 2 MG tablet TAKE 1 TABLET BY MOUTH DAILY 01/08/16  Yes Denita Lung, MD  warfarin (COUMADIN) 5 MG tablet Take 1 tablet (5 mg total) by mouth daily. 07/14/18  Yes Denita Lung, MD  atorvastatin (LIPITOR) 20 MG tablet Take 1 tablet (20 mg total) by mouth daily. Patient not taking: Reported on 05/03/2019 04/01/19 03/31/20  Denita Lung, MD    ROS:  Out of a complete 14 system review of symptoms, the patient complains only of the following symptoms, and all other reviewed systems are negative.  Back pain, left leg pain  Blood pressure 116/72, pulse (!) 53, temperature 98.2 F (36.8 C), temperature source Temporal, height '5\' 8"'$  (1.727 m), weight 217 lb (98.4 kg), SpO2 96 %.  Physical Exam  General: The patient is alert and cooperative at the time of the examination.  The patient is moderately obese.  Neuromuscular: The patient has good flexion range of movement of the low  back and rotational movement of the low back.  No discomfort was noted over the SI joints on either side, there is some tenderness along the left paraspinal muscles to palpation at the upper lumbar area.  Skin: No significant peripheral edema is noted.   Neurologic Exam  Mental status: The patient is alert and oriented x 3 at the time of the examination. The patient has apparent normal recent and remote memory, with an apparently normal attention span and concentration ability.   Cranial nerves: Facial symmetry  is present. Speech is normal, no aphasia or dysarthria is noted. Extraocular movements are full. Visual fields are full.  Motor: The patient has good strength in all 4 extremities.  Sensory examination: Soft touch sensation is symmetric on the face, arms, and legs.  The patient has hypersensitivity to light touch on the left lower leg and foot.  Coordination: The patient has good finger-nose-finger and heel-to-shin bilaterally.  Gait and station: The patient has a normal gait. Tandem gait is slightly unsteady. Romberg is negative. No drift is seen.  Reflexes: Deep tendon reflexes are symmetric.   Assessment/Plan:  1.  Chronic left leg pain, probable complex regional pain syndrome  2.  Chronic low back pain  The patient will be seen by Dr. Nicholaus Bloom next week, hopefully he can get some benefit with appropriate treatment for the above pain syndrome.  The patient does not wish to go on any medications for his discomfort.  He will follow-up here in 6 months.  Jill Alexanders MD 05/27/2019 9:51 AM  [facility] [address] [place], [place] [address]  Phone [phone] Fax [phone]

## 2019-06-01 DIAGNOSIS — G90522 Complex regional pain syndrome I of left lower limb: Secondary | ICD-10-CM | POA: Diagnosis not present

## 2019-06-01 DIAGNOSIS — G894 Chronic pain syndrome: Secondary | ICD-10-CM | POA: Diagnosis not present

## 2019-06-01 DIAGNOSIS — G4730 Sleep apnea, unspecified: Secondary | ICD-10-CM | POA: Diagnosis not present

## 2019-06-01 DIAGNOSIS — Z79891 Long term (current) use of opiate analgesic: Secondary | ICD-10-CM | POA: Diagnosis not present

## 2019-06-01 DIAGNOSIS — M47816 Spondylosis without myelopathy or radiculopathy, lumbar region: Secondary | ICD-10-CM | POA: Diagnosis not present

## 2019-06-03 ENCOUNTER — Other Ambulatory Visit (HOSPITAL_BASED_OUTPATIENT_CLINIC_OR_DEPARTMENT_OTHER): Payer: Self-pay

## 2019-06-03 DIAGNOSIS — G478 Other sleep disorders: Secondary | ICD-10-CM

## 2019-06-03 DIAGNOSIS — R0683 Snoring: Secondary | ICD-10-CM

## 2019-06-03 DIAGNOSIS — G4730 Sleep apnea, unspecified: Secondary | ICD-10-CM

## 2019-06-07 ENCOUNTER — Telehealth: Payer: Self-pay | Admitting: *Deleted

## 2019-06-07 NOTE — Telephone Encounter (Signed)
Records faxed to [facility] - Release [id number]

## 2019-06-14 ENCOUNTER — Other Ambulatory Visit (HOSPITAL_COMMUNITY)
Admission: RE | Admit: 2019-06-14 | Discharge: 2019-06-14 | Disposition: A | Discharge status: Home / Self Care | Payer: Medicare Other | Source: Ambulatory Visit | Attending: Internal Medicine | Admitting: Internal Medicine

## 2019-06-14 DIAGNOSIS — Z01812 Encounter for preprocedural laboratory examination: Secondary | ICD-10-CM | POA: Insufficient documentation

## 2019-06-14 DIAGNOSIS — Z20828 Contact with and (suspected) exposure to other viral communicable diseases: Secondary | ICD-10-CM | POA: Diagnosis not present

## 2019-06-14 LAB — SARS CORONAVIRUS 2 (TAT 6-24 HRS): SARS Coronavirus 2: NEGATIVE

## 2019-06-16 ENCOUNTER — Other Ambulatory Visit: Payer: Self-pay

## 2019-06-16 ENCOUNTER — Ambulatory Visit (HOSPITAL_BASED_OUTPATIENT_CLINIC_OR_DEPARTMENT_OTHER): Payer: Medicare Other | Attending: Anesthesiology | Admitting: Internal Medicine

## 2019-06-16 DIAGNOSIS — G4733 Obstructive sleep apnea (adult) (pediatric): Secondary | ICD-10-CM | POA: Insufficient documentation

## 2019-06-16 DIAGNOSIS — R0683 Snoring: Secondary | ICD-10-CM | POA: Diagnosis not present

## 2019-06-16 DIAGNOSIS — G478 Other sleep disorders: Secondary | ICD-10-CM | POA: Insufficient documentation

## 2019-06-16 DIAGNOSIS — G4730 Sleep apnea, unspecified: Secondary | ICD-10-CM

## 2019-06-17 ENCOUNTER — Other Ambulatory Visit: Payer: Self-pay | Admitting: Neurology

## 2019-06-17 ENCOUNTER — Other Ambulatory Visit: Payer: Self-pay | Admitting: Family Medicine

## 2019-06-17 DIAGNOSIS — D6851 Activated protein C resistance: Secondary | ICD-10-CM

## 2019-06-17 DIAGNOSIS — Z7901 Long term (current) use of anticoagulants: Secondary | ICD-10-CM

## 2019-06-20 DIAGNOSIS — R0683 Snoring: Secondary | ICD-10-CM

## 2019-06-20 NOTE — Progress Notes (Signed)
Schedule an appt to discuss this

## 2019-06-20 NOTE — Procedures (Signed)
Patient Name: Frederick Payne, [place] Date: 06/16/2019 Gender: Male D.O.B: 1961/08/18 Age (years): 58 Referring Provider: Thyra Breed Height (inches): 68 Interpreting Physician: Jetty Duhamel MD, ABSM Weight (lbs): 207 RPSGT: Shelah Lewandowsky BMI: 31 MRN: [id number] Neck Size: 15.50  CLINICAL INFORMATION Sleep Study Type: NPSG Indication for sleep study: Fatigue, Obesity, Snoring Epworth Sleepiness Score: 0  SLEEP STUDY TECHNIQUE As per the AASM Manual for the Scoring of Sleep and Associated Events v2.3 (April 2016) with a hypopnea requiring 4% desaturations.  The channels recorded and monitored were frontal, central and occipital EEG, electrooculogram (EOG), submentalis EMG (chin), nasal and oral airflow, thoracic and abdominal wall motion, anterior tibialis EMG, snore microphone, electrocardiogram, and pulse oximetry.  MEDICATIONS Medications self-administered by patient taken the night of the study : none reported  SLEEP ARCHITECTURE The study was initiated at 10:32:39 PM and ended at 5:25:31 AM.  Sleep onset time was 31.5 minutes and the sleep efficiency was 72.5%%. The total sleep time was 299.5 minutes.  Stage REM latency was 76.0 minutes.  The patient spent 8.2%% of the night in stage N1 sleep, 81.0%% in stage N2 sleep, 0.0%% in stage N3 and 10.9% in REM.  Alpha intrusion was absent.  Supine sleep was 49.25%.  RESPIRATORY PARAMETERS The overall apnea/hypopnea index (AHI) was 7.4 per hour. There were 2 total apneas, including 1 obstructive, 1 central and 0 mixed apneas. There were 35 hypopneas and 60 RERAs.  The AHI during Stage REM sleep was 25.8 per hour.  AHI while supine was 6.9 per hour.  The mean oxygen saturation was 90.5%. The minimum SpO2 during sleep was 86.0%.  moderate snoring was noted during this study.  CARDIAC DATA The 2 lead EKG demonstrated sinus rhythm. The mean heart rate was 46.7 beats per minute. Other EKG findings include: None.   LEG MOVEMENT DATA The total PLMS were 0 with a resulting PLMS index of 0.0. Associated arousal with leg movement index was 0.0 .  IMPRESSIONS - Mild obstructive sleep apnea occurred during this study (AHI = 7.4/h). - Insufficient early events to meet protocol requirements for split CPAP titration. - No significant central sleep apnea occurred during this study (CAI = 0.2/h). - Mild oxygen desaturation was noted during this study (Min O2 = 86.0%).Mean sat 90.5%. - The patient snored with moderate snoring volume. - No cardiac abnormalities were noted during this study. - Clinically significant periodic limb movements did not occur during sleep. No significant associated arousals.  DIAGNOSIS - Obstructive Sleep Apnea (327.23 [G47.33 ICD-10])  RECOMMENDATIONS - Treatment for mild OSA is directed at symptoms. Conservative measures may include observation, weight loss and sleep position off back. - Other options, including CPAP or a fitted oral appliance, would be based on clinical judgment. - Be careful with  alcohol, sedatives and other CNS depressants that may worsen sleep apnea and disrupt normal sleep architecture. - Sleep hygiene should be reviewed to assess factors that may improve sleep quality. - Weight management and regular exercise should be initiated or continued if appropriate.  [Electronically signed] 06/20/2019 12:56 PM  Jetty Duhamel MD, ABSM Diplomate, American Board of Sleep Medicine   NPI: [id number]                         Jetty Duhamel Diplomate, American Board of Sleep Medicine  ELECTRONICALLY SIGNED ON:  06/20/2019, 12:53 PM [facility] PH: [phone]   FX: [phone] ACCREDITED BY THE AMERICAN ACADEMY OF SLEEP  MEDICINE

## 2019-06-21 ENCOUNTER — Other Ambulatory Visit: Payer: Self-pay

## 2019-06-21 ENCOUNTER — Ambulatory Visit (INDEPENDENT_AMBULATORY_CARE_PROVIDER_SITE_OTHER): Payer: Medicare Other | Admitting: Family Medicine

## 2019-06-21 ENCOUNTER — Encounter: Payer: Self-pay | Admitting: Family Medicine

## 2019-06-21 VITALS — Wt 216.1 lb

## 2019-06-21 DIAGNOSIS — I2510 Atherosclerotic heart disease of native coronary artery without angina pectoris: Secondary | ICD-10-CM | POA: Diagnosis not present

## 2019-06-21 DIAGNOSIS — G4733 Obstructive sleep apnea (adult) (pediatric): Secondary | ICD-10-CM | POA: Insufficient documentation

## 2019-06-21 NOTE — Progress Notes (Signed)
Subjective:    Patient ID: Frederick Payne, male    DOB: [birth year], 58 y.o.   MRN: [id number]  HPI Documentation for virtual telephone encounter. Documentation for virtual audio and video telecommunications through Doximity encounter: The patient was located at home. The provider was located in the office. The patient did consent to this visit and is aware of possible charges through their insurance for this visit. The other persons participating in this telemedicine service were none. Time spent on call was 15 minutes minutes total.  This virtual service is not related to other E/M service within previous 7 days. He recently had a sleep study done.  He was referred by Dr. Anne Hahn to Dr. Vear Clock who ordered the sleep study as part of a pain management regimen.  Sleep study did show an AHI of 7.5.  I discussed this with him in regard to CPAP versus dental appliance.  Also discussed sleep hygiene with him in regard to avoiding alcohol which he does, stimulants including tea.  No afternoon naps.  Proper sleep hygiene was also discussed with him.  Weight management was not discussed .  He is not interested in CPAP and will consider dental.  He is also to discuss this further with Dr. Vear Clock as at this point I am not sure that any intervention would be of benefit as he states that he really has very little difficulty with sleep apnea type symptoms.     Review of Systems     Objective:   Physical Exam        Assessment & Plan:

## 2019-06-30 ENCOUNTER — Other Ambulatory Visit (INDEPENDENT_AMBULATORY_CARE_PROVIDER_SITE_OTHER): Payer: Medicare Other

## 2019-06-30 DIAGNOSIS — Z23 Encounter for immunization: Secondary | ICD-10-CM

## 2019-07-12 ENCOUNTER — Other Ambulatory Visit: Payer: Self-pay

## 2019-07-15 ENCOUNTER — Other Ambulatory Visit: Payer: Self-pay | Admitting: Family Medicine

## 2019-07-16 ENCOUNTER — Telehealth: Payer: Self-pay | Admitting: Cardiology

## 2019-07-16 NOTE — Telephone Encounter (Signed)
Agree with recs. Keep appointment with Hao Meng  Peter Martinique MD, [facility]

## 2019-07-16 NOTE — Telephone Encounter (Signed)
Returned call to pt informed him that this "episode" he probably should have went to the ER to be evaluated. Passing out and vomiting is definitely a good reason to go and be evaluated. He stated that if this happens again he should have someone take him to the ER. Verbalized understanding. He states that he would like this message forwarded to Dr Martinique

## 2019-07-16 NOTE — Telephone Encounter (Signed)
Spoke to patient Dr.Jordan's advice given. 

## 2019-07-16 NOTE — Telephone Encounter (Signed)
° °  Patient wanted to make Dr. Martinique aware that Frederick Payne had an episode last night where his heart was skipping a beat. Every time his heart skipped a beat it had a hard pounding sensation, like someone was hitting his chest with a hammer. This feeling also caused him to start shake, sweat black out and vomit. Right before Frederick Payne blacked out Frederick Payne also lost his hearing. His family called EMS, they came, but Frederick Payne declined going to the hospital. Frederick Payne doesn't recall what EMS did for him at the house.   Frederick Payne feels better now, but just wanted to make Dr. Martinique aware of what occurred last night. Frederick Payne has an appointment scheduled to se [name] 10/06 at 2:15 pm. Frederick Payne wanted to know what to do if this were to happen again, and if Frederick Payne needed to come in sooner. Please advise

## 2019-07-27 DIAGNOSIS — G90522 Complex regional pain syndrome I of left lower limb: Secondary | ICD-10-CM | POA: Diagnosis not present

## 2019-07-27 DIAGNOSIS — M47816 Spondylosis without myelopathy or radiculopathy, lumbar region: Secondary | ICD-10-CM | POA: Diagnosis not present

## 2019-07-27 DIAGNOSIS — G894 Chronic pain syndrome: Secondary | ICD-10-CM | POA: Diagnosis not present

## 2019-08-03 ENCOUNTER — Other Ambulatory Visit: Payer: Self-pay

## 2019-08-03 ENCOUNTER — Encounter: Payer: Self-pay | Admitting: Physician Assistant

## 2019-08-03 ENCOUNTER — Ambulatory Visit (INDEPENDENT_AMBULATORY_CARE_PROVIDER_SITE_OTHER): Payer: Medicare Other | Admitting: Physician Assistant

## 2019-08-03 VITALS — BP 139/82 | HR 59 | Temp 97.7°F | Ht 68.0 in | Wt 217.8 lb

## 2019-08-03 DIAGNOSIS — I2510 Atherosclerotic heart disease of native coronary artery without angina pectoris: Secondary | ICD-10-CM

## 2019-08-03 DIAGNOSIS — I10 Essential (primary) hypertension: Secondary | ICD-10-CM | POA: Diagnosis not present

## 2019-08-03 DIAGNOSIS — R55 Syncope and collapse: Secondary | ICD-10-CM | POA: Diagnosis not present

## 2019-08-03 DIAGNOSIS — E785 Hyperlipidemia, unspecified: Secondary | ICD-10-CM

## 2019-08-03 DIAGNOSIS — Z952 Presence of prosthetic heart valve: Secondary | ICD-10-CM | POA: Diagnosis not present

## 2019-08-03 DIAGNOSIS — D682 Hereditary deficiency of other clotting factors: Secondary | ICD-10-CM | POA: Diagnosis not present

## 2019-08-03 DIAGNOSIS — I4892 Unspecified atrial flutter: Secondary | ICD-10-CM | POA: Diagnosis not present

## 2019-08-03 NOTE — Patient Instructions (Addendum)
Medication Instructions:  Your physician recommends that you continue on your current medications as directed. Please refer to the Current Medication list given to you today.  If you need a refill on your cardiac medications before your next appointment, please call your pharmacy.   Lab work: You will need to have labs (blood work) drawn today:  BMP  CBC  If you have labs (blood work) drawn today and your tests are completely normal, you will receive your results only by: [name] MyChart Message (if you have MyChart) OR . A paper copy in the mail If you have any lab test that is abnormal or we need to change your treatment, we will call you to review the results.  Testing/Procedures: Your physician has recommended that you wear an event monitor. Event monitors are medical devices that record the heart's electrical activity. Doctors most often [place] these monitors to diagnose arrhythmias. Arrhythmias are problems with the speed or rhythm of the heartbeat. The monitor is a small, portable device. You can wear one while you do your normal daily activities. This is usually used to diagnose what is causing palpitations/syncope (passing out).  Preventice montior  Your physician has requested that you have an echocardiogram. Echocardiography is a painless test that uses sound waves to create images of your heart. It provides your doctor with information about the size and shape of your heart and how well your heart's chambers and valves are working. This procedure takes approximately one hour. There are no restrictions for this procedure. This procedure is done at [facility] [address]   Please schedule within 2 weeks  Follow-Up: At [facility], you and your health needs are our priority.  As part of our continuing mission to provide you with exceptional heart care, we have created designated Provider Care Teams.  These Care Teams include your primary Cardiologist (physician) and Advanced Practice  Providers (APPs -  Physician Assistants and Nurse Practitioners) who all work together to provide you with the care you need, when you need it. You will need a follow up appointment in 2 months with Peter Martinique, MD or [name], PA-C  Any Other Special Instructions Will Be Listed Below (If Applicable).

## 2019-08-03 NOTE — Progress Notes (Signed)
Cardiology Office Note    Date:  08/04/2019   ID:  Frederick Payne, DOB [birth year], MRN [id number]  PCP:  Frederick Nian, MD  Cardiologist:  Dr. Swaziland   Chief Complaint  Patient presents with  . Follow-up    recent syncope and palpitation    History of Present Illness:  Frederick Payne is a 58 y.o. male with PMH of CAD, aortic valve disease s/p mechanical AVR with Bentall procedure, atrial flutter on coumadin, EtOH abuse, factor V Leiden, history of DVT after ankle fracture, hypertension, and PVD. He previously had a DVT of his left leg.  He had IVC filter in place due to 3 separate episodes of PEs.  He underwent aortic valve replacement in July 2014 with St Jude mechanical valve along with single-vessel CABG. Last echocardiogram obtained in January 2017 showed EF 55 to 60%, normal-appearing mechanical AVR with stable trivial perivalvular regurgitation.  He was admitted at [facility] with typical atrial flutter in April 2017 and was cardioverted at the time.  Repeat cardiac cath done at the time demonstrated nonobstructive CAD.  Previous heart monitor in July 2017 showed PACs and PVCs, no A. fib or a flutter.  He had a chest pain other than that was evaluated at the [facility] in June 2018.  Myoview study at the time was negative.  CT of abdomen pelvis showed no compromise of IVC filter.  He was last seen by Frederick Mech, NP on 12/22/2018, he has abstained from drinking since September 2019.  He was doing well at the time.  He underwent sleep study in August 2028 that showed mild obstructive sleep apnea, conservative management including observation and weight loss was recommended.  Based on recent phone report, he had significant palpitation recently that was accompanied by a passing out episode on 9/17.  EMS came, however he refused to go to the ED. he says he did have some pain in the chest that was accompanied by sharp pounding sensation.  The chest pain only lasted about a  second before going away however recurred 4 different times along with a feeling of skipped heartbeat and heart pounding.  On the fourth time, she lost hearing and subsequently passed out in the chair.  He was seen in front of his mother-in-law at the time and his mother-in-law was the one who called EMS.  He only had symptoms one time before however that was during emotional and physical exertion when his neighbors pitbull killed his cat and he chased after the pitbull.  Since the episode of passing out 3 weeks ago, he has not had any recurrent symptoms.  He is able to do daily activity without exertional symptoms.  He does notice a little bit more fatigued than usual.  I will obtain standard lab works including CBC and BMET.  I plan to obtain a 30-day event monitor and also echocardiogram.  His symptom of chest pain is atypical for angina, therefore I decided to hold off on stress test unless echocardiogram is abnormal.    Past Medical History:  Diagnosis Date  . Aortic stenosis    Had surgery performed  . CAD (coronary artery disease)   . Chronic anemia   . Deep vein thrombosis (HCC) 2006   After a broken ankle--recently diagnosed with reflex sympathetic dystrophy of the left lower extremity  . Elevated LFTs    h/o excessive alcohol  . Factor V Leiden (HCC)   . Hypertension   . Ischemic heart disease   .  Psychiatric disorder    Underlying  . PUD (peptic ulcer disease)    with bleeding  . Reflex sympathetic dystrophy     Past Surgical History:  Procedure Laterality Date  . AORTIC VALVE REPLACEMENT  07/15/12   27 mm St Jude  . CORONARY ARTERY BYPASS GRAFT     x1  . GALLBLADDER SURGERY  2008  . gastrectomy and vagotomy    . greenfield filter      Current Medications: Outpatient Medications Prior to Visit  Medication Sig Dispense Refill  . albuterol (VENTOLIN HFA) 108 (90 Base) MCG/ACT inhaler INHALE 2 PUFFS BY MOUTH EVERY 4 TO 6 HOURS AS NEEDED 18 g 0  . ascorbic acid (VITAMIN  C) 500 MG tablet Take 500 mg by mouth daily.    [name] aspirin 81 MG tablet Take 81 mg by mouth daily.    [name] atorvastatin (LIPITOR) 20 MG tablet Take 1 tablet (20 mg total) by mouth daily. 90 tablet 3  . COAGUCHEK LANCETS MISC 1 each by Does not apply route as directed. 200 each 1  . Copper Gluconate (COPPER CAPS) 2 MG CAPS Take by mouth every evening.    [name] FEROSUL 325 (65 Fe) MG tablet TAKE 1 TABLET BY MOUTH EVERY DAY 90 tablet 0  . fish oil-omega-3 fatty acids 1000 MG capsule Take 1 g by mouth daily.    . Fluticasone Furoate (ARNUITY ELLIPTA) 100 MCG/ACT AEPB Inhale 1 puff into the lungs daily. 30 each 11  . levalbuterol (XOPENEX HFA) 45 MCG/ACT inhaler INHALE 1-2 PUFFS INTO THE LUNGS EVERY 4-6 HOURS AS NEEDED FOR WHEEZING OR SHORTNESS OF BREATH 75 g 0  . metoprolol succinate (TOPROL-XL) 50 MG 24 hr tablet Take 1 tablet (50 mg total) by mouth daily. 90 tablet 3  . mometasone (ASMANEX, 60 METERED DOSES,) 220 MCG/INH inhaler Inhale 2 puffs into the lungs daily. 1 Inhaler 12  . Multiple Vitamins-Minerals (ONE-A-DAY MENS 50+ ADVANTAGE PO) Take by mouth daily.    [name] POLICOSANOL PO Take 20 mg by mouth daily.    [name] POLY-IRON 150 150 MG capsule TAKE 1 CAPSULE BY MOUTH EVERY DAY 90 capsule 3  . PT/INR Test (COAGUCHEK PT TEST) STRP 1 each by In Vitro route as directed. 48 each 1  . PT/INR Testing Monitor (COAGUCHEK XS PLUS SYSTEM) KIT 1 each by Does not apply route as directed. 1 kit 0  . S-Adenosylmethionine (SAM-E) 400 MG TABS Take 1 tablet by mouth daily.    . St Johns Wort 300 MG CAPS Take 300 mg by mouth 2 (two) times daily.    . Thiamine HCl (B-1) 100 MG TABS Take by mouth.    . warfarin (COUMADIN) 10 MG tablet TAKE 1 TABLET(10 MG) BY MOUTH DAILY 90 tablet 0  . warfarin (COUMADIN) 2 MG tablet TAKE 1 TABLET BY MOUTH DAILY 30 tablet 2  . warfarin (COUMADIN) 5 MG tablet Take 1 tablet (5 mg total) by mouth daily. 90 tablet 1   No facility-administered medications prior to visit.      Allergies:    Benzodiazepines, Contrast media [iodinated diagnostic agents], Iodine, Other, Pectin, Shellfish allergy, Sulfa antibiotics, Diazepam, and Lactose intolerance (gi)   Social History   Socioeconomic History  . Marital status: Married    Spouse name: Not on file  . Number of children: 2  . Years of education: Not on file  . Highest education level: Not on file  Occupational History  . Occupation: finance-disabled  Social Needs  . Physicist, medical  strain: Not on file  . Food insecurity    Worry: Not on file    Inability: Not on file  . Transportation needs    Medical: Not on file    Non-medical: Not on file  Tobacco Use  . Smoking status: Former Games developer  . Smokeless tobacco: Never Used  . Tobacco comment: quit 15 + yrs ago  Substance and Sexual Activity  . Alcohol use: No    Alcohol/week: 0.0 standard drinks  . Drug use: No  . Sexual activity: Yes    Partners: Male  Lifestyle  . Physical activity    Days per week: Not on file    Minutes per session: Not on file  . Stress: Not on file  Relationships  . Social Musician on phone: Not on file    Gets together: Not on file    Attends religious service: Not on file    Active member of club or organization: Not on file    Attends meetings of clubs or organizations: Not on file    Relationship status: Not on file  Other Topics Concern  . Not on file  Social History Narrative   Lives at home w/ his partner, Susann Givens and his elderly mother   Right-handed   Drinks about 3 cups of tea per day     Family History:  The patient's family history includes Healthy in his father; Stroke in his paternal grandmother.   ROS:   Please see the history of present illness.    ROS All other systems reviewed and are negative.   PHYSICAL EXAM:   VS:  BP 139/82   Pulse (!) 59   Temp 97.7 F (36.5 C)   Ht 5\' 8"  (1.727 m)   Wt 217 lb 12.8 oz (98.8 kg)   SpO2 92%   BMI 33.12 kg/m    GEN: Well nourished, well developed, in  no acute distress  HEENT: normal  Neck: no JVD, carotid bruits, or masses Cardiac: RRR; no murmurs, rubs, or gallops,no edema  Respiratory:  clear to auscultation bilaterally, normal work of breathing GI: soft, nontender, nondistended, + BS MS: no deformity or atrophy  Skin: warm and dry, no rash Neuro:  Alert and Oriented x 3, Strength and sensation are intact Psych: euthymic mood, full affect  Wt Readings from Last 3 Encounters:  08/03/19 217 lb 12.8 oz (98.8 kg)  06/21/19 216 lb 1.6 oz (98 kg)  06/16/19 207 lb (93.9 kg)      Studies/Labs Reviewed:   EKG:  EKG is ordered today.  The ekg ordered today demonstrates NSR without significant ST-T wave changes  Recent Labs: 04/01/2019: ALT 50; BUN 10; Creatinine, Ser 0.97; Hemoglobin 14.8; Platelets 143; Potassium 4.7; Sodium 141   Lipid Panel    Component Value Date/Time   CHOL 207 (H) 04/01/2019 1124   TRIG 153 (H) 04/01/2019 1124   HDL 39 (L) 04/01/2019 1124   CHOLHDL 5.3 (H) 04/01/2019 1124   LDLCALC 137 (H) 04/01/2019 1124    Additional studies/ records that were reviewed today include:   Echo 11/13/2015 LV EF: 55% -   60% Study Conclusions  - Left ventricle: The cavity size was normal. Wall thickness was   normal. Systolic function was normal. The estimated ejection   fraction was in the range of 55% to 60%. - Aortic valve: Normal appearing mechanical AVR with stable trivial   peri valvular regurgitation. Valve area (VTI): 1.74 cm^2. Valve   area (Vmax):  1.8 cm^2. Valve area (Vmean): 1.71 cm^2. - Left atrium: The atrium was mildly dilated. - Atrial septum: No defect or patent foramen ovale was identified.   ASSESSMENT:    1. Syncope, unspecified syncope type   2. S/P AVR (aortic valve replacement) and aortoplasty   3. Atrial flutter, unspecified type (HCC)   4. Factor V deficiency (HCC)   5. Essential hypertension   6. Coronary artery disease involving native coronary artery of native heart without angina  pectoris   7. Hyperlipidemia LDL goal <70      PLAN:  In order of problems listed above:  1. Syncope: Symptom was accompanied by heart pounding sensation.  I will obtain a 30-day event monitor and echocardiogram.  2. History of mechanical AVR and Bentall procedure: Obtain echocardiogram.  3. Atrial flutter: Maintaining sinus rhythm.  Continue Coumadin.  4. Factor V Leiden: On Coumadin  5. Hypertension: Blood pressure stable.  6. CAD: Nonobstructive CAD on previous cardiac cath.  7. Hyperlipidemia: Continue Lipitor.  Previous lipid panel was uncontrolled, however patient suspect he was not fasting on the day.    Medication Adjustments/Labs and Tests Ordered: Current medicines are reviewed at length with the patient today.  Concerns regarding medicines are outlined above.  Medication changes, Labs and Tests ordered today are listed in the Patient Instructions below. Patient Instructions  Medication Instructions:  Your physician recommends that you continue on your current medications as directed. Please refer to the Current Medication list given to you today.  If you need a refill on your cardiac medications before your next appointment, please call your pharmacy.   Lab work: You will need to have labs (blood work) drawn today:  BMP  CBC  If you have labs (blood work) drawn today and your tests are completely normal, you will receive your results only by: [name] MyChart Message (if you have MyChart) OR . A paper copy in the mail If you have any lab test that is abnormal or we need to change your treatment, we will call you to review the results.  Testing/Procedures: Your physician has recommended that you wear an event monitor. Event monitors are medical devices that record the heart's electrical activity. Doctors most often [place] these monitors to diagnose arrhythmias. Arrhythmias are problems with the speed or rhythm of the heartbeat. The monitor is a small, portable device. You  can wear one while you do your normal daily activities. This is usually used to diagnose what is causing palpitations/syncope (passing out).  Preventice montior  Your physician has requested that you have an echocardiogram. Echocardiography is a painless test that uses sound waves to create images of your heart. It provides your doctor with information about the size and shape of your heart and how well your heart's chambers and valves are working. This procedure takes approximately one hour. There are no restrictions for this procedure. This procedure is done at [facility]   Please schedule within 2 weeks  Follow-Up: At [facility], you and your health needs are our priority.  As part of our continuing mission to provide you with exceptional heart care, we have created designated Provider Care Teams.  These Care Teams include your primary Cardiologist (physician) and Advanced Practice Providers (APPs -  Physician Assistants and Nurse Practitioners) who all work together to provide you with the care you need, when you need it. You will need a follow up appointment in 2 months with Peter Swaziland, MD or Azalee Course, PA-C  Any Other Special Instructions Will Be Listed Below (If Applicable).        Ramond Dial, [place]  08/04/2019 12:16 AM    [facility] [address] [place], [place], [place]  [address] Phone: [phone]; Fax: [phone]

## 2019-08-04 LAB — BASIC METABOLIC PANEL
BUN/Creatinine Ratio: 12 (ref 9–20)
BUN: 12 mg/dL (ref 6–24)
CO2: 23 mmol/L (ref 20–29)
Calcium: 9.4 mg/dL (ref 8.7–10.2)
Chloride: 103 mmol/L (ref 96–106)
Creatinine, Ser: 1 mg/dL (ref 0.76–1.27)
GFR calc Af Amer: 95 mL/min/{1.73_m2} (ref >59)
GFR calc non Af Amer: 83 mL/min/{1.73_m2} (ref >59)
Glucose: 95 mg/dL (ref 65–99)
Potassium: 4.5 mmol/L (ref 3.5–5.2)
Sodium: 142 mmol/L (ref 134–144)

## 2019-08-04 LAB — CBC
Hematocrit: 45.1 % (ref 37.5–51.0)
Hemoglobin: 15.3 g/dL (ref 13.0–17.7)
MCH: 29.3 pg (ref 26.6–33.0)
MCHC: 33.9 g/dL (ref 31.5–35.7)
MCV: 86 fL (ref 79–97)
Platelets: 149 10*3/uL — ABNORMAL LOW (ref 150–450)
RBC: 5.23 x10E6/uL (ref 4.14–5.80)
RDW: 13.4 % (ref 11.6–15.4)
WBC: 4.1 10*3/uL (ref 3.4–10.8)

## 2019-08-06 ENCOUNTER — Ambulatory Visit (HOSPITAL_COMMUNITY): Payer: Medicare Other | Attending: Cardiology

## 2019-08-06 ENCOUNTER — Other Ambulatory Visit: Payer: Self-pay

## 2019-08-06 DIAGNOSIS — Z952 Presence of prosthetic heart valve: Secondary | ICD-10-CM | POA: Insufficient documentation

## 2019-08-09 ENCOUNTER — Telehealth: Payer: Self-pay

## 2019-08-09 ENCOUNTER — Telehealth: Payer: Self-pay | Admitting: Family Medicine

## 2019-08-09 NOTE — Telephone Encounter (Signed)
Pt called and stated that he had a Echo on Friday and they only told him he could have a blood clot in his left Ventricle. Pt wanted to know if you could pull the report and call him whenever you can to tell him what that means. He can be reached at [phone]

## 2019-08-09 NOTE — Telephone Encounter (Addendum)
Left a voice message for the patient to give the office a call to discuss his lab results.   ----- Message from [place], [place] sent at 08/09/2019  9:09 AM EDT ----- Renal function, electrolyte and red blood cell count all normal

## 2019-08-11 ENCOUNTER — Telehealth: Payer: Self-pay

## 2019-08-11 NOTE — Telephone Encounter (Signed)
Spoke to pt, went over details of monitor. Verified address. Ordered 30 day preventice Event monitor to be mailed to pt's home address.

## 2019-08-24 DIAGNOSIS — G894 Chronic pain syndrome: Secondary | ICD-10-CM | POA: Diagnosis not present

## 2019-08-24 DIAGNOSIS — M47816 Spondylosis without myelopathy or radiculopathy, lumbar region: Secondary | ICD-10-CM | POA: Diagnosis not present

## 2019-08-24 DIAGNOSIS — G90522 Complex regional pain syndrome I of left lower limb: Secondary | ICD-10-CM | POA: Diagnosis not present

## 2019-08-25 ENCOUNTER — Ambulatory Visit (INDEPENDENT_AMBULATORY_CARE_PROVIDER_SITE_OTHER): Payer: Medicare Other

## 2019-08-25 DIAGNOSIS — R55 Syncope and collapse: Secondary | ICD-10-CM

## 2019-09-07 ENCOUNTER — Telehealth: Payer: Self-pay | Admitting: Cardiology

## 2019-09-07 NOTE — Telephone Encounter (Signed)
New message  Pt c/o Syncope: STAT if syncope occurred within 30 minutes and pt complains of lightheadedness High Priority if episode of passing out, completely, today or in last 24 hours   1. Did you pass out today? Patient out last night  2. When is the last time you passed out? See above message   3. Has this occurred multiple times? Yes   4. Did you have any symptoms prior to passing out? Nauseated and loss of hearing

## 2019-09-07 NOTE — Telephone Encounter (Signed)
Patient had another episode of passing out last night- Last night HR dropped to 49  83/51 89/56 70/47  before being put to bed.  Currently wearing monitor at this moment-   Had passing out episode last night, pt does mention having SOB, and just before he passes out he notices nausea and loss of hearing before.  -no episodes of passing out today.  -no low HR warnings from his apple watch this morning- he just checked while on the phone with me and it was 52 HR.  Advised that if BP drops low as it did last night to call EMS to have them come out and check to make sure it is accurate- also to do an EKG to see what his rhythm looks like. Patient verbalized understanding. But will route to MD for recommendations.

## 2019-09-08 NOTE — Telephone Encounter (Signed)
Drop in BP and HR along with symptoms suggest he had a vasovagal episode. For now I would recommend reducing Toprol XL to 25 mg daily. Will await results of monitor.

## 2019-09-08 NOTE — Telephone Encounter (Signed)
Spoke to patient Dr.Jordan's recommendation given. 

## 2019-09-15 ENCOUNTER — Other Ambulatory Visit: Payer: Self-pay | Admitting: Family Medicine

## 2019-09-15 ENCOUNTER — Ambulatory Visit (INDEPENDENT_AMBULATORY_CARE_PROVIDER_SITE_OTHER): Payer: Medicare Other | Admitting: Family Medicine

## 2019-09-15 ENCOUNTER — Other Ambulatory Visit: Payer: Self-pay

## 2019-09-15 ENCOUNTER — Encounter: Payer: Self-pay | Admitting: Family Medicine

## 2019-09-15 VITALS — BP 128/82 | HR 59 | Temp 96.2°F | Wt 219.4 lb

## 2019-09-15 DIAGNOSIS — I2510 Atherosclerotic heart disease of native coronary artery without angina pectoris: Secondary | ICD-10-CM | POA: Diagnosis not present

## 2019-09-15 DIAGNOSIS — G90522 Complex regional pain syndrome I of left lower limb: Secondary | ICD-10-CM

## 2019-09-15 DIAGNOSIS — R55 Syncope and collapse: Secondary | ICD-10-CM

## 2019-09-15 DIAGNOSIS — R001 Bradycardia, unspecified: Secondary | ICD-10-CM | POA: Diagnosis not present

## 2019-09-15 DIAGNOSIS — Z5181 Encounter for therapeutic drug level monitoring: Secondary | ICD-10-CM

## 2019-09-15 DIAGNOSIS — H9313 Tinnitus, bilateral: Secondary | ICD-10-CM

## 2019-09-15 DIAGNOSIS — D6851 Activated protein C resistance: Secondary | ICD-10-CM

## 2019-09-15 DIAGNOSIS — Z7901 Long term (current) use of anticoagulants: Secondary | ICD-10-CM

## 2019-09-15 DIAGNOSIS — H9193 Unspecified hearing loss, bilateral: Secondary | ICD-10-CM

## 2019-09-15 NOTE — Telephone Encounter (Signed)
[facility] is requesting to fill pt warfarin. Not sure if we are following pt . Please advise [facility]

## 2019-09-15 NOTE — Progress Notes (Signed)
Subjective:    Patient ID: Frederick Payne, male    DOB: Oct 07, 1961, 58 y.o.   MRN: [id number]  HPI He is here for consult concerning multiple issues.  He has had at least 4 episodes of syncope.  Which were definitely verified with associated bradycardia.  He presently does have an event monitor on and is being followed by cardiology.  He does have underlying aortic valve replacement.  He did have his metoprolol decreased and has noted an increase of PVCs because of that.  He also complains of increased difficulty with tinnitus as well as occasional episodes of dizziness.  He continues on Coumadin and usually does a fairly good job of controlling this although his last PT/INR was around 5.  He knows to keep it between 2.5 and 3.5.  Deviations usually occur because of change in his eating habits revolving around greens.   Review of Systems     Objective:   Physical Exam Alert and in no distress.  TMs are normal.  Cardiac exam does show a loud S2 but no murmurs.  Lungs are clear to auscultation.  No obvious purpuric lesions were noted.  Hearing screen did show high-frequency hearing loss.       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  And in conjunction with him being on Coumadin has been concerned about syncopal episodes causing trauma and bleeding. Reviewed the need for him to maintain regular eating habits in regard to greens and taking his Coumadin. We will also refer to ENT at his request.  I do not think he has Mnire's disease but think it is reasonable to have him see ENT and possibly talk about white noise aids.

## 2019-09-19 NOTE — Progress Notes (Signed)
Cardiology Office Note    Date:  09/22/2019   ID:  Frederick Payne, DOB [birth year], MRN [id number]  PCP:  Ronnald Nian, MD  Cardiologist:  Dr. Swaziland   Chief Complaint  Patient presents with  . Dizziness    History of Present Illness:  Frederick Payne is a 58 y.o. male seen for follow up syncope and bradycardia. He has a  PMH of CAD, aortic valve disease s/p mechanical AVR with Bentall procedure, atrial flutter on coumadin, EtOH abuse, factor V Leiden, history of DVT after ankle fracture, hypertension, and PVD. He previously had a DVT of his left leg.  He had IVC filter in place due to 3 separate episodes of PEs.  He underwent aortic valve replacement in July 2014 with St Jude mechanical valve along with single-vessel CABG. Last echocardiogram obtained in January 2017 showed EF 55 to 60%, normal-appearing mechanical AVR with stable trivial perivalvular regurgitation.  He was admitted at [facility] with typical atrial flutter in April 2017 and was cardioverted at the time.  Repeat cardiac cath done at the time demonstrated nonobstructive CAD.  Previous heart monitor in July 2017 showed PACs and PVCs, no A. fib or a flutter.  He had a chest pain other than that was evaluated at the [facility] in June 2018.  Myoview study at the time was negative.  CT of abdomen pelvis showed no compromise of IVC filter.  He was  seen by Frederick Mech, NP on 12/22/2018, he has abstained from drinking since September 2019.  He was doing well at the time.  He underwent sleep study in August 2028 that showed mild obstructive sleep apnea, conservative management including observation and weight loss was recommended.  In September he had significant palpitation  that was accompanied by a passing out episode on 9/17.  EMS came, however he refused to go to the ED. he says he did have some pain in the chest that was accompanied by sharp pounding sensation.  The chest pain only lasted about a second before  going away however recurred 4 different times along with a feeling of skipped heartbeat and heart pounding.  On the fourth time, he lost hearing and subsequently passed out in the chair.  This was witnessed by his mother-in-law at the time and his mother-in-law was the one who called EMS.  He only had symptoms one time before however that was during emotional and physical exertion when his neighbors pitbull killed his cat and he chased after the pitbull. He was seen by Frederick Course PA-C in October. Lab work done and [employer] and CBC were normal. Echo showed normal LV function with LVH. Aorta dilated to 4.2 cm. RV enlargement. Normal functioning AV prosthesis. Event monitor placed.   He reports that since September he has had 3 other episodes of syncope. All episodes except one occurred while watching TV and sitting in a chair. He initially feels nausea, loss of hearing, sweating then everything goes black. He feels like his life force is draining away. His partner notes he has an empty stare. No jerking. No incontinence. Symptoms resolve after a period of lying down. No clear triggers. States partner checked BP during an episode and it dropped to 70/47 and HR dropped to less than 40. He does note that since reduction in Toprol dose he has not had another episode. He was wearing a monitor with his last episode.   Past Medical History:  Diagnosis Date  . Aortic stenosis  Had surgery performed  . CAD (coronary artery disease)   . Chronic anemia   . Deep vein thrombosis (HCC) 2006   After a broken ankle--recently diagnosed with reflex sympathetic dystrophy of the left lower extremity  . Elevated LFTs    h/o excessive alcohol  . Factor V Leiden (HCC)   . Hypertension   . Ischemic heart disease   . Psychiatric disorder    Underlying  . PUD (peptic ulcer disease)    with bleeding  . Reflex sympathetic dystrophy     Past Surgical History:  Procedure Laterality Date  . AORTIC VALVE REPLACEMENT  07/15/12    27 mm St Jude  . CORONARY ARTERY BYPASS GRAFT     x1  . GALLBLADDER SURGERY  2008  . gastrectomy and vagotomy    . greenfield filter      Current Medications: Outpatient Medications Prior to Visit  Medication Sig Dispense Refill  . albuterol (VENTOLIN HFA) 108 (90 Base) MCG/ACT inhaler INHALE 2 PUFFS BY MOUTH EVERY 4 TO 6 HOURS AS NEEDED 18 g 0  . ascorbic acid (VITAMIN C) 500 MG tablet Take 500 mg by mouth daily.    [name] aspirin 81 MG tablet Take 81 mg by mouth daily.    [name] atorvastatin (LIPITOR) 20 MG tablet Take 1 tablet (20 mg total) by mouth daily. 90 tablet 3  . COAGUCHEK LANCETS MISC 1 each by Does not apply route as directed. 200 each 1  . FEROSUL 325 (65 Fe) MG tablet TAKE 1 TABLET BY MOUTH EVERY DAY 90 tablet 0  . fish oil-omega-3 fatty acids 1000 MG capsule Take 1 g by mouth daily.    . Fluticasone Furoate (ARNUITY ELLIPTA) 100 MCG/ACT AEPB Inhale 1 puff into the lungs daily. 30 each 11  . metoprolol succinate (TOPROL XL) 25 MG 24 hr tablet Take 1 tablet (25 mg total) by mouth daily.    . mometasone (ASMANEX, 60 METERED DOSES,) 220 MCG/INH inhaler Inhale 2 puffs into the lungs daily. 1 Inhaler 12  . Multiple Vitamins-Minerals (ONE-A-DAY MENS 50+ ADVANTAGE PO) Take by mouth daily.    [name] POLICOSANOL PO Take 20 mg by mouth daily.    [name] POLY-IRON 150 150 MG capsule TAKE 1 CAPSULE BY MOUTH EVERY DAY 90 capsule 3  . PT/INR Test (COAGUCHEK PT TEST) STRP 1 each by In Vitro route as directed. 48 each 1  . PT/INR Testing Monitor (COAGUCHEK XS PLUS SYSTEM) KIT 1 each by Does not apply route as directed. 1 kit 0  . S-Adenosylmethionine (SAM-E) 400 MG TABS Take 1 tablet by mouth daily.    . St Johns Wort 300 MG CAPS Take 300 mg by mouth 2 (two) times daily.    . Thiamine HCl (B-1) 100 MG TABS Take by mouth.    . warfarin (COUMADIN) 10 MG tablet TAKE 1 TABLET(10 MG) BY MOUTH DAILY 90 tablet 0  . warfarin (COUMADIN) 2 MG tablet TAKE 1 TABLET BY MOUTH DAILY 30 tablet 2  . warfarin (COUMADIN) 5 MG  tablet Take 1 tablet (5 mg total) by mouth daily. 90 tablet 1  . Copper Gluconate (COPPER CAPS) 2 MG CAPS Take by mouth every evening.    . levalbuterol (XOPENEX HFA) 45 MCG/ACT inhaler INHALE 1-2 PUFFS INTO THE LUNGS EVERY 4-6 HOURS AS NEEDED FOR WHEEZING OR SHORTNESS OF BREATH (Patient not taking: Reported on 09/15/2019) 75 g 0   No facility-administered medications prior to visit.      Allergies:   Benzodiazepines, Contrast  media [iodinated diagnostic agents], Iodine, Other, Pectin, Shellfish allergy, Sulfa antibiotics, Diazepam, and Lactose intolerance (gi)   Social History   Socioeconomic History  . Marital status: Media planner    Spouse name: Not on file  . Number of children: 2  . Years of education: Not on file  . Highest education level: Not on file  Occupational History  . Occupation: finance-disabled  Social Needs  . Financial resource strain: Not on file  . Food insecurity    Worry: Not on file    Inability: Not on file  . Transportation needs    Medical: Not on file    Non-medical: Not on file  Tobacco Use  . Smoking status: Former Games developer  . Smokeless tobacco: Never Used  . Tobacco comment: quit 15 + yrs ago  Substance and Sexual Activity  . Alcohol use: No    Alcohol/week: 0.0 standard drinks  . Drug use: No  . Sexual activity: Yes    Partners: Male  Lifestyle  . Physical activity    Days per week: Not on file    Minutes per session: Not on file  . Stress: Not on file  Relationships  . Social Musician on phone: Not on file    Gets together: Not on file    Attends religious service: Not on file    Active member of club or organization: Not on file    Attends meetings of clubs or organizations: Not on file    Relationship status: Not on file  Other Topics Concern  . Not on file  Social History Narrative   Lives at home w/ his partner, Susann Givens and his elderly mother   Right-handed   Drinks about 3 cups of tea per day     Family  History:  The patient's family history includes Healthy in his father; Stroke in his paternal grandmother.   ROS:   Please see the history of present illness.    ROS All other systems reviewed and are negative.   PHYSICAL EXAM:   VS:  BP (!) 155/88   Pulse 75   Ht 5\' 8"  (1.727 m)   Wt 218 lb (98.9 kg)   SpO2 98%   BMI 33.15 kg/m    Orthostatic check: BP 141/84 lying, 148/91 sitting, 142/91 standing and 143/93 extended standing. HR did not change.  GEN: Well nourished, well developed, in no acute distress  HEENT: normal  Neck: no JVD, carotid bruits, or masses Cardiac: RRR; no murmurs, rubs, or gallops,no edema. Good mechanical AV click. Respiratory:  clear to auscultation bilaterally, normal work of breathing GI: soft, nontender, nondistended, + BS MS: no deformity or atrophy  Skin: warm and dry, no rash Neuro:  Alert and Oriented x 3, Strength and sensation are intact Psych: euthymic mood, full affect  Wt Readings from Last 3 Encounters:  09/22/19 218 lb (98.9 kg)  09/15/19 219 lb 6.4 oz (99.5 kg)  08/03/19 217 lb 12.8 oz (98.8 kg)      Studies/Labs Reviewed:   EKG:  EKG is ordered today.  The ekg ordered today demonstrates NSR rate 75. LAD, LVH by voltage. I have personally reviewed and interpreted this study.   Recent Labs: 04/01/2019: ALT 50 08/03/2019: BUN 12; Creatinine, Ser 1.00; Hemoglobin 15.3; Platelets 149; Potassium 4.5; Sodium 142   Lipid Panel    Component Value Date/Time   CHOL 207 (H) 04/01/2019 1124   TRIG 153 (H) 04/01/2019 1124   HDL 39 (L) 04/01/2019  1124   CHOLHDL 5.3 (H) 04/01/2019 1124   LDLCALC 137 (H) 04/01/2019 1124    Additional studies/ records that were reviewed today include:   Echo 11/13/2015 LV EF: 55% -   60% Study Conclusions  - Left ventricle: The cavity size was normal. Wall thickness was   normal. Systolic function was normal. The estimated ejection   fraction was in the range of 55% to 60%. - Aortic valve: Normal  appearing mechanical AVR with stable trivial   peri valvular regurgitation. Valve area (VTI): 1.74 cm^2. Valve   area (Vmax): 1.8 cm^2. Valve area (Vmean): 1.71 cm^2. - Left atrium: The atrium was mildly dilated. - Atrial septum: No defect or patent foramen ovale was identified.  Echo 08/06/19: IMPRESSIONS    1. Left ventricular ejection fraction, by visual estimation, is 60 to 65%. The left ventricle has normal function. Normal left ventricular size. There is moderately increased left ventricular hypertrophy.  2. Left ventricular diastolic Doppler parameters are indeterminate pattern of LV diastolic filling.  3. Global right ventricle has moderately reduced systolic function.The right ventricular size is mildly enlarged. No increase in right ventricular wall thickness.  4. Left atrial size was normal.  5. Right atrial size was normal.  6. The mitral valve is normal in structure. No evidence of mitral valve regurgitation. No evidence of mitral stenosis.  7. The tricuspid valve is normal in structure. Tricuspid valve regurgitation was not visualized by color flow Doppler.  8. Aortic valve regurgitation is trivial by color flow Doppler.  9. Bileaflet mechanical prosthesis in the aortic valve position. 10. The pulmonic valve was normal in structure. Pulmonic valve regurgitation is not visualized by color flow Doppler. 11. Aortic dilatation noted. 12. There is moderate dilatation of the ascending aorta measuring 42 mm. 13. TR signal is inadequate for assessing pulmonary artery systolic pressure.   ASSESSMENT:    No diagnosis found.   PLAN:  In order of problems listed above:  1. Syncope: Based on history his symptoms are most consistent with vasovagal syncope. Intermittent heart block is another possibility. Will send back his event monitor today and see if there is any significant arrhythmia. For now continue lower dose Toprol. May need to stop altogether if he has recurrent symptoms.  Recommend liberal salt intake. Wear compression hose. Counseled on early recognition and need to lie flat when he first gets them.    2. History of mechanical AVR and Bentall procedure: Echo shows good valve function.  3. Atrial flutter: Maintaining sinus rhythm.  Continue Coumadin.  4. Factor V Leiden: On Coumadin  5. Hypertension: Blood pressure stable. Not orthostatic today  6. CAD: Nonobstructive CAD on previous cardiac cath.  7. Hyperlipidemia: he questioned about taking  Lipitor.  LDL 137. Recommended he go ahead and take Lipitor.     Medication Adjustments/Labs and Tests Ordered: Current medicines are reviewed at length with the patient today.  Concerns regarding medicines are outlined above.  Medication changes, Labs and Tests ordered today are listed in the Patient Instructions below. There are no Patient Instructions on file for this visit.   Signed, Sabastian Raimondi Swaziland, MD  09/22/2019 5:20 PM    [facility] [address] [place], [place], [place]  [address] Phone: [phone]; Fax: [phone]

## 2019-09-21 ENCOUNTER — Encounter (INDEPENDENT_AMBULATORY_CARE_PROVIDER_SITE_OTHER): Payer: Self-pay | Admitting: Otolaryngology

## 2019-09-21 ENCOUNTER — Other Ambulatory Visit: Payer: Self-pay

## 2019-09-21 ENCOUNTER — Ambulatory Visit (INDEPENDENT_AMBULATORY_CARE_PROVIDER_SITE_OTHER): Payer: Medicare Other | Admitting: Otolaryngology

## 2019-09-21 VITALS — Temp 97.3°F

## 2019-09-21 DIAGNOSIS — H9313 Tinnitus, bilateral: Secondary | ICD-10-CM | POA: Diagnosis not present

## 2019-09-21 DIAGNOSIS — I2510 Atherosclerotic heart disease of native coronary artery without angina pectoris: Secondary | ICD-10-CM | POA: Diagnosis not present

## 2019-09-21 DIAGNOSIS — H903 Sensorineural hearing loss, bilateral: Secondary | ICD-10-CM | POA: Diagnosis not present

## 2019-09-21 NOTE — Progress Notes (Signed)
HPI: Frederick Payne is a 58 y.o. male who is referred by Dr. Redmond School for evaluation of tinnitus in bilateral ears. Patient has had ringing in his ears for several years but this has increased over the last couple months. He states that he has had two falls in the past around a year ago in which he landed on his face. Otherwise, he denies any trauma to the ears. He denies history of ear infections or loud noise exposure. He does not notice much decrease in his hearing. He denies ear pain, ear drainage, nasal congestion. No further complaints today.  Past Medical History:  Diagnosis Date  . Aortic stenosis    Had surgery performed  . CAD (coronary artery disease)   . Chronic anemia   . Deep vein thrombosis ([place]) 2006   After a broken ankle--recently diagnosed with reflex sympathetic dystrophy of the left lower extremity  . Elevated LFTs    h/o excessive alcohol  . Factor V Leiden ([place])   . Hypertension   . Ischemic heart disease   . Psychiatric disorder    Underlying  . PUD (peptic ulcer disease)    with bleeding  . Reflex sympathetic dystrophy    Past Surgical History:  Procedure Laterality Date  . AORTIC VALVE REPLACEMENT  07/15/12   27 mm St Jude  . CORONARY ARTERY BYPASS GRAFT     x1  . GALLBLADDER SURGERY  2008  . gastrectomy and vagotomy    . greenfield filter     Social History   Socioeconomic History  . Marital status: Soil scientist    Spouse name: Not on file  . Number of children: 2  . Years of education: Not on file  . Highest education level: Not on file  Occupational History  . Occupation: finance-disabled  Social Needs  . Financial resource strain: Not on file  . Food insecurity    Worry: Not on file    Inability: Not on file  . Transportation needs    Medical: Not on file    Non-medical: Not on file  Tobacco Use  . Smoking status: Former Research scientist (life sciences)  . Smokeless tobacco: Never Used  . Tobacco comment: quit 15 + yrs ago  Substance and Sexual Activity  .  Alcohol use: No    Alcohol/week: 0.0 standard drinks  . Drug use: No  . Sexual activity: Yes    Partners: Male  Lifestyle  . Physical activity    Days per week: Not on file    Minutes per session: Not on file  . Stress: Not on file  Relationships  . Social Herbalist on phone: Not on file    Gets together: Not on file    Attends religious service: Not on file    Active member of club or organization: Not on file    Attends meetings of clubs or organizations: Not on file    Relationship status: Not on file  Other Topics Concern  . Not on file  Social History Narrative   Lives at home w/ his partner, Mateo Flow and his elderly mother   Right-handed   Drinks about 3 cups of tea per day   Family History  Problem Relation Age of Onset  . Healthy Father   . Stroke Paternal Grandmother    Allergies  Allergen Reactions  . Benzodiazepines Other (See Comments)    Causes pt to get violent   . Contrast Media [Iodinated Diagnostic Agents] Hives  . Iodine Anaphylaxis  .  Other Anaphylaxis    FRUITS FROM TREES.   . Pectin Anaphylaxis  . Shellfish Allergy Anaphylaxis  . Sulfa Antibiotics Anaphylaxis  . Diazepam Other (See Comments)    Headache, strange behavior, memory loss  . Lactose Intolerance (Gi) Nausea And Vomiting   Prior to Admission medications   Medication Sig Start Date End Date Taking? Authorizing Provider  albuterol (VENTOLIN HFA) 108 (90 Base) MCG/ACT inhaler INHALE 2 PUFFS BY MOUTH EVERY 4 TO 6 HOURS AS NEEDED 04/05/19   Denita Lung, MD  ascorbic acid (VITAMIN C) 500 MG tablet Take 500 mg by mouth daily.    [provider]  aspirin 81 MG tablet Take 81 mg by mouth daily.    [provider]  atorvastatin (LIPITOR) 20 MG tablet Take 1 tablet (20 mg total) by mouth daily. 04/01/19 03/31/20  Denita Lung, MD  COAGUCHEK LANCETS MISC 1 each by Does not apply route as directed. 02/14/16   Brunetta Genera, MD  Copper Gluconate (COPPER CAPS) 2  MG CAPS Take by mouth every evening.    [provider]  FEROSUL 325 (65 Fe) MG tablet TAKE 1 TABLET BY MOUTH EVERY DAY 07/15/19   Denita Lung, MD  fish oil-omega-3 fatty acids 1000 MG capsule Take 1 g by mouth daily.    [provider]  Fluticasone Furoate (ARNUITY ELLIPTA) 100 MCG/ACT AEPB Inhale 1 puff into the lungs daily. 04/23/19   Denita Lung, MD  levalbuterol [facility] HFA) 45 MCG/ACT inhaler INHALE 1-2 PUFFS INTO THE LUNGS EVERY 4-6 HOURS AS NEEDED FOR WHEEZING OR SHORTNESS OF BREATH Patient not taking: Reported on 09/15/2019 05/05/18   Denita Lung, MD  metoprolol succinate (TOPROL XL) 25 MG 24 hr tablet Take 1 tablet (25 mg total) by mouth daily. 09/08/19   Martinique, Peter M, MD  mometasone (ASMANEX, 60 METERED DOSES,) 220 MCG/INH inhaler Inhale 2 puffs into the lungs daily. 04/01/19   Denita Lung, MD  Multiple Vitamins-Minerals (ONE-A-DAY MENS 50+ ADVANTAGE PO) Take by mouth daily.    [provider]  POLICOSANOL PO Take 20 mg by mouth daily.    [provider]  POLY-IRON 150 150 MG capsule TAKE 1 CAPSULE BY MOUTH EVERY DAY 02/04/19   Denita Lung, MD  PT/INR Test [facility] PT TEST) STRP 1 each by In Vitro route as directed. 02/14/16   Brunetta Genera, MD  PT/INR Testing Monitor (COAGUCHEK XS PLUS SYSTEM) KIT 1 each by Does not apply route as directed. 02/14/16   Brunetta Genera, MD  S-Adenosylmethionine (SAM-E) 400 MG TABS Take 1 tablet by mouth daily.    [provider]  [facility] Wort 300 MG CAPS Take 300 mg by mouth 2 (two) times daily.    [provider]  Thiamine HCl (B-1) 100 MG TABS Take by mouth.    [provider]  warfarin (COUMADIN) 10 MG tablet TAKE 1 TABLET(10 MG) BY MOUTH DAILY 09/15/19   Denita Lung, MD  warfarin (COUMADIN) 2 MG tablet TAKE 1 TABLET BY MOUTH DAILY 01/08/16   Denita Lung, MD  warfarin (COUMADIN) 5 MG tablet Take 1 tablet (5 mg total) by mouth daily. 07/14/18   Denita Lung, MD     Positive ROS: otherwise negative  All other systems have been reviewed and were otherwise negative with the exception of those mentioned in the HPI and as above.  Physical Exam: Constitutional: Alert, well-appearing, no acute distress Ears: External ears without  lesions or tenderness. Ear canals are clear bilaterally with intact, clear TMs. Tuning forks reveal symmetric hearing with AC>BC bilaterally and mild high frequency SNHL with 1024 tuning fork. Audiogram reveals normal sloping to moderate high frequency SNHL. SRTs are 20 dB. Type A tympanograms bilaterally. Nasal: External nose without lesions. Septum deviates to right. Clear nasal passages Oral: Lips and gums without lesions. Tongue and palate mucosa without lesions. Posterior oropharynx clear. Neck: No palpable adenopathy or masses Respiratory: Breathing comfortably  Skin: No facial/neck lesions or rash noted.  Procedures  Assessment: Bilateral tinnitus SNHL, bilateral  Plan: Reviewed audiogram with patient. He is a candidate for hearing aids. Also discussed masking noises and Lipo-Flavonoid; samples provided in office today. He will follow up as needed.  Christin Hoffstadt, PA-C  I agree with the assessment and plan as outlined above. Radene Journey, MD

## 2019-09-22 ENCOUNTER — Other Ambulatory Visit: Payer: Self-pay

## 2019-09-22 ENCOUNTER — Encounter: Payer: Self-pay | Admitting: Cardiology

## 2019-09-22 ENCOUNTER — Ambulatory Visit (INDEPENDENT_AMBULATORY_CARE_PROVIDER_SITE_OTHER): Payer: Medicare Other | Admitting: Cardiology

## 2019-09-22 VITALS — BP 155/88 | HR 75 | Ht 68.0 in | Wt 218.0 lb

## 2019-09-22 DIAGNOSIS — I10 Essential (primary) hypertension: Secondary | ICD-10-CM | POA: Diagnosis not present

## 2019-09-22 DIAGNOSIS — D682 Hereditary deficiency of other clotting factors: Secondary | ICD-10-CM

## 2019-09-22 DIAGNOSIS — R55 Syncope and collapse: Secondary | ICD-10-CM

## 2019-09-22 DIAGNOSIS — I2510 Atherosclerotic heart disease of native coronary artery without angina pectoris: Secondary | ICD-10-CM

## 2019-09-22 DIAGNOSIS — I4892 Unspecified atrial flutter: Secondary | ICD-10-CM | POA: Diagnosis not present

## 2019-09-22 DIAGNOSIS — Z952 Presence of prosthetic heart valve: Secondary | ICD-10-CM

## 2019-09-30 ENCOUNTER — Encounter: Payer: Self-pay | Admitting: Family Medicine

## 2019-10-01 ENCOUNTER — Encounter (INDEPENDENT_AMBULATORY_CARE_PROVIDER_SITE_OTHER): Payer: Self-pay

## 2019-10-04 ENCOUNTER — Telehealth: Payer: Self-pay

## 2019-10-04 ENCOUNTER — Encounter: Payer: Self-pay | Admitting: Family Medicine

## 2019-10-04 ENCOUNTER — Other Ambulatory Visit: Payer: Self-pay | Admitting: Family Medicine

## 2019-10-04 NOTE — Telephone Encounter (Signed)
[facility] is requesting to fill pt ventolin. Please advise [facility] 

## 2019-10-04 NOTE — Telephone Encounter (Signed)
Spoke to patient about email sent to [facility] last week.Stated he does not want to see a rhythm specialist.Stated he stopped taking metoprolol.He continues to have palpitations.Appointment offered to see Dr.Jordan.Patient stated he did not want appointment.Advised to call back if needed.

## 2019-10-06 ENCOUNTER — Telehealth: Payer: Self-pay

## 2019-10-06 NOTE — Telephone Encounter (Signed)
Spoke to patient about email he sent today.Dr.Jordan advised ok to resume Toprol 50 mg daily.He advised to schedule appointment with Dr.Klein.Advised I have left 2 messages with his scheduler to call you with appointment.Stated he restarted Toprol 25 mg and he feels a lot better.He will continue 25 mg and increase to 50 mg if he needs to.

## 2019-10-17 ENCOUNTER — Encounter: Payer: Self-pay | Admitting: Family Medicine

## 2019-10-18 ENCOUNTER — Ambulatory Visit: Payer: Medicare Other | Admitting: Cardiology

## 2019-10-25 ENCOUNTER — Other Ambulatory Visit: Payer: Self-pay | Admitting: Family Medicine

## 2019-10-25 NOTE — Telephone Encounter (Signed)
[facility] is requesting to fill pt ferosul 325. Please advise [facility]

## 2019-10-27 DIAGNOSIS — R55 Syncope and collapse: Secondary | ICD-10-CM | POA: Insufficient documentation

## 2019-10-27 NOTE — Progress Notes (Signed)
ELECTROPHYSIOLOGY CONSULT NOTE  Patient ID: Frederick Payne, MRN: [id number], DOB/AGE: [birth year] 58 y.o. Admit date: (Not on file) Date of Consult: 10/28/2019  Primary Physician: Ronnald Nian, MD Primary Cardiologist: Zuhaib Qualley is a 58 y.o. male who is being seen today for the evaluation of syncope at the request of PJ this is and.    HPI Frederick Payne is a 58 y.o. male seen in consultation for recurrent syncope; 4 episodes over the fall 2020  The episodes have a stereotypical prodrome; all have occurred while seated but one.  Preceded by nausea, diaphoresis and loss of hearing and vision.  His partner and mother have witnessed and made no comment on skin color;  Residual fatigue and orthostatic intolerance.  Orthostatic intolerance, shower intolerance and heat intolerance  Was wearing a monitor during one such episode and while he did not activate the monitor, but nothing was noted by autodetect.  His partner had documented HR>>40 and BP 70/47 with a spell  He has been able to abort spells since seeing Dr Garth Bigness by lying on the floor.   He has a hx of remote episodes when as a boy punished by having to stand stil  History of aortic valve replacement-mechanical/CABG times 10/2011   Atrial flutter-typical per [facility] notes 3/17 underwent cardioversion  Factor V Leiden with recurrent pulmonary embolism on Coumadin, aspirin is status post IVC filter DATE TEST EF   3//17 LHC    % Cors Non Obstructive  3/17 Echo   55-60 %          Thromboembolic risk factors (, HTN-1, T  Vasc disease -1) for a CHADSVASc Score of 2   History of GI bleeds status post gastrectomy  NOT on anticoagulation    Telephone notes from 10/04/2019 were reviewed.  Following the event recorder that was unrevealing, EP referral was recommended.  It was declined.  It was then set up.   Past Medical History:  Diagnosis Date  . Aortic stenosis    Had surgery performed  . CAD (coronary artery  disease)   . Chronic anemia   . Deep vein thrombosis (HCC) 2006   After a broken ankle--recently diagnosed with reflex sympathetic dystrophy of the left lower extremity  . Elevated LFTs    h/o excessive alcohol  . Factor V Leiden (HCC)   . Hypertension   . Ischemic heart disease   . Psychiatric disorder    Underlying  . PUD (peptic ulcer disease)    with bleeding  . Reflex sympathetic dystrophy       Surgical History:  Past Surgical History:  Procedure Laterality Date  . AORTIC VALVE REPLACEMENT  07/15/12   27 mm St Jude  . CORONARY ARTERY BYPASS GRAFT     x1  . GALLBLADDER SURGERY  2008  . gastrectomy and vagotomy    . greenfield filter       Home Meds: Current Meds  Medication Sig  . albuterol (VENTOLIN HFA) 108 (90 Base) MCG/ACT inhaler INHALE 2 PUFFS BY MOUTH EVERY 4 TO 6 HOURS AS NEEDED  . ascorbic acid (VITAMIN C) 500 MG tablet Take 500 mg by mouth daily.  [name] aspirin 81 MG tablet Take 81 mg by mouth daily.  [name] atorvastatin (LIPITOR) 20 MG tablet Take 1 tablet (20 mg total) by mouth daily.  Dewaine Conger LANCETS MISC 1 each by Does not apply route as directed.  . FEROSUL 325 (65 Fe) MG tablet TAKE  1 TABLET BY MOUTH EVERY DAY  . fish oil-omega-3 fatty acids 1000 MG capsule Take 1 g by mouth daily.  . Fluticasone Furoate (ARNUITY ELLIPTA) 100 MCG/ACT AEPB Inhale 1 puff into the lungs daily.  . metoprolol succinate (TOPROL XL) 25 MG 24 hr tablet Take 1 tablet (25 mg total) by mouth daily.  . mometasone (ASMANEX, 60 METERED DOSES,) 220 MCG/INH inhaler Inhale 2 puffs into the lungs daily.  . Multiple Vitamins-Minerals (ONE-A-DAY MENS 50+ ADVANTAGE PO) Take by mouth daily.  [name] POLICOSANOL PO Take 20 mg by mouth daily.  [name] POLY-IRON 150 150 MG capsule TAKE 1 CAPSULE BY MOUTH EVERY DAY  . PT/INR Test (COAGUCHEK PT TEST) STRP 1 each by In Vitro route as directed.  [name] PT/INR Testing Monitor (COAGUCHEK XS PLUS SYSTEM) KIT 1 each by Does not apply route as directed.  .  S-Adenosylmethionine (SAM-E) 400 MG TABS Take 1 tablet by mouth daily.  . [place] Johns Wort 300 MG CAPS Take 300 mg by mouth 2 (two) times daily.  . Thiamine HCl (B-1) 100 MG TABS Take by mouth.  . warfarin (COUMADIN) 10 MG tablet TAKE 1 TABLET(10 MG) BY MOUTH DAILY  . warfarin (COUMADIN) 2 MG tablet TAKE 1 TABLET BY MOUTH DAILY  . warfarin (COUMADIN) 5 MG tablet Take 1 tablet (5 mg total) by mouth daily.    Allergies:  Allergies  Allergen Reactions  . Benzodiazepines Other (See Comments)    Causes pt to get violent   . Contrast Media [Iodinated Diagnostic Agents] Hives  . Iodine Anaphylaxis  . Other Anaphylaxis    FRUITS FROM TREES.   . Pectin Anaphylaxis  . Shellfish Allergy Anaphylaxis  . Sulfa Antibiotics Anaphylaxis  . Diazepam Other (See Comments)    Headache, strange behavior, memory loss  . Lactose Intolerance (Gi) Nausea And Vomiting    Social History   Socioeconomic History  . Marital status: Media planner    Spouse name: Not on file  . Number of children: 2  . Years of education: Not on file  . Highest education level: Not on file  Occupational History  . Occupation: finance-disabled  Tobacco Use  . Smoking status: Former Games developer  . Smokeless tobacco: Never Used  . Tobacco comment: quit 15 + yrs ago  Substance and Sexual Activity  . Alcohol use: No    Alcohol/week: 0.0 standard drinks  . Drug use: No  . Sexual activity: Yes    Partners: Male  Other Topics Concern  . Not on file  Social History Narrative   Lives at home w/ his partner, Susann Givens and his elderly mother   Right-handed   Drinks about 3 cups of tea per day   Social Determinants of Health   Financial Resource Strain:   . Difficulty of Paying Living Expenses: Not on file  Food Insecurity:   . Worried About Programme researcher, broadcasting/film/video in the Last Year: Not on file  . Ran Out of Food in the Last Year: Not on file  Transportation Needs:   . Lack of Transportation (Medical): Not on file  . Lack of  Transportation (Non-Medical): Not on file  Physical Activity:   . Days of Exercise per Week: Not on file  . Minutes of Exercise per Session: Not on file  Stress:   . Feeling of Stress : Not on file  Social Connections:   . Frequency of Communication with Friends and Family: Not on file  . Frequency of Social Gatherings with Friends and Family:  Not on file  . Attends Religious Services: Not on file  . Active Member of Clubs or Organizations: Not on file  . Attends Banker Meetings: Not on file  . Marital Status: Not on file  Intimate Partner Violence:   . Fear of Current or Ex-Partner: Not on file  . Emotionally Abused: Not on file  . Physically Abused: Not on file  . Sexually Abused: Not on file     Family History  Problem Relation Age of Onset  . Healthy Father   . Stroke Paternal Grandmother      ROS:  Please see the history of present illness.     All other systems reviewed and negative.    Physical Exam:  Blood pressure 128/88, pulse 61, height 5\' 8"  (1.727 m), weight 217 lb 6.4 oz (98.6 kg). General: Well developed, well nourished male in no acute distress. Head: Normocephalic, atraumatic, sclera non-icteric, no xanthomas, nares are without discharge. EENT: normal  Lymph Nodes:  none Neck: Negative for carotid bruits. JVD not elevated. Back:without scoliosis kyphosis  Lungs: Clear bilaterally to auscultation without wheezes, rales, or rhonchi. Breathing is unlabored. Heart: RRR with S1 mechanical S2. 2/6 murmur . No rubs, or gallops appreciated. Abdomen: Soft, non-tender, non-distended with normoactive bowel sounds. No hepatomegaly. No rebound/guarding. No obvious abdominal masses. Msk:  Strength and tone appear normal for age. Extremities: No clubbing or cyanosis. No + edema.  Distal pedal pulses are 2+ and equal bilaterally. Skin: Warm and Dry Neuro: Alert and oriented X 3. CN III-XII intact Grossly normal sensory and motor function . Psych:  Responds  to questions appropriately with a normal affect.      Labs: Cardiac Enzymes No results for input(s): CKTOTAL, CKMB, TROPONINI in the last 72 hours. CBC Lab Results  Component Value Date   WBC 4.1 08/03/2019   HGB 15.3 08/03/2019   HCT 45.1 08/03/2019   MCV 86 08/03/2019   PLT 149 (L) 08/03/2019   PROTIME: No results for input(s): LABPROT, INR in the last 72 hours. Chemistry No results for input(s): NA, K, CL, CO2, BUN, CREATININE, CALCIUM, PROT, BILITOT, ALKPHOS, ALT, AST, GLUCOSE in the last 168 hours.  Invalid input(s): LABALBU Lipids Lab Results  Component Value Date   CHOL 207 (H) 04/01/2019   HDL 39 (L) 04/01/2019   LDLCALC 137 (H) 04/01/2019   TRIG 153 (H) 04/01/2019   BNP No results found for: PROBNP Thyroid Function Tests: No results for input(s): TSH, T4TOTAL, T3FREE, THYROIDAB in the last 72 hours.  Invalid input(s): FREET3 Miscellaneous Lab Results  Component Value Date   DDIMER 0.43 11/17/2014    Radiology/Studies:  No results found.  EKG: sinus @ 61 18/10/43    Assessment and Plan:  Syncope presumed vasovagal   Orthostatic intolerance  AVR-mechanical   Factor V leiden-- PE recurrent with IVC filter   ATrial flutter [place] 2017 (verified by adenosine infusion per notes dated 3/17.  History of severe GI bleed status post gastrectomy    Patient has vasovagal syncope.  He has been using an [employer] monitor and during reported episodes demonstrated bradycardia. His 30-day event recorder associated with syncopal episodes failed to record tachycardia as a potential trigger. He has a bad back and spends a lot of time in a chair watching television.  I suspect venous pooling is the trigger of his events; not quite sure why it is so much worse in the last 3 months.  We discussed extensively the issues of dysautonomia, the physiology  of orthstasis and positional stress.  We discussed the role of salt and water repletion, the importance of  exercise, often needing to be started in the recumbent position, and the awareness of triggers and the role of ambient heat and dehydration  He has underlying orthostatic stress.  He has a history of vasovagal syncope that dates back to when he was a boy associated with prolonged standing.  I suggested the use of CUBII to promote venous return while sitting.  I also suggested the role of compressive wear, thigh sleeves and abdominal binders.  I suspect that neither would be precluded by his venous insufficiency disease related to his recurrent DVT.  He is better on his metoprolol.  We will continue it.  Be glad to see him as needed

## 2019-10-28 ENCOUNTER — Ambulatory Visit (INDEPENDENT_AMBULATORY_CARE_PROVIDER_SITE_OTHER): Payer: Medicare Other | Admitting: Internal Medicine

## 2019-10-28 ENCOUNTER — Other Ambulatory Visit: Payer: Self-pay

## 2019-10-28 ENCOUNTER — Encounter: Payer: Self-pay | Admitting: Internal Medicine

## 2019-10-28 VITALS — BP 128/88 | HR 61 | Ht 68.0 in | Wt 217.4 lb

## 2019-10-28 DIAGNOSIS — I483 Typical atrial flutter: Secondary | ICD-10-CM | POA: Diagnosis not present

## 2019-10-28 DIAGNOSIS — R55 Syncope and collapse: Secondary | ICD-10-CM | POA: Diagnosis not present

## 2019-10-28 NOTE — Patient Instructions (Addendum)
Medication Instructions:  Your physician recommends that you continue on your current medications as directed. Please refer to the Current Medication list given to you today.   *If you need a refill on your cardiac medications before your next appointment, please call your pharmacy*  Lab Work: None ordered.  If you have labs (blood work) drawn today and your tests are completely normal, you will receive your results only by: [name] MyChart Message (if you have MyChart) OR . A paper copy in the mail If you have any lab test that is abnormal or we need to change your treatment, we will call you to review the results.  Testing/Procedures: None ordered.    Follow-Up:  No return appointment.    At [facility], you and your health needs are our priority.  As part of our continuing mission to provide you with exceptional heart care, we have created designated Provider Care Teams.  These Care Teams include your primary Cardiologist (physician) and Advanced Practice Providers (APPs -  Physician Assistants and Nurse Practitioners) who all work together to provide you with the care you need, when you need it.  Dr Caryl Comes would like for you to consider purchasing a Cubii and wearing Compressive wear (thigh sleeve and abdominal binder)  You can find the thigh sleeves at any athletic wear store and abdominal binders may be purchased at a local pharmacy.

## 2019-11-02 ENCOUNTER — Encounter: Payer: Self-pay | Admitting: Family Medicine

## 2019-11-25 ENCOUNTER — Encounter: Payer: Self-pay | Admitting: Family Medicine

## 2019-11-25 ENCOUNTER — Other Ambulatory Visit: Payer: Self-pay | Admitting: *Deleted

## 2019-11-26 ENCOUNTER — Telehealth: Payer: Self-pay

## 2019-11-26 ENCOUNTER — Encounter: Payer: Self-pay | Admitting: Family Medicine

## 2019-11-26 ENCOUNTER — Telehealth: Payer: Self-pay | Admitting: Radiology

## 2019-11-26 DIAGNOSIS — I483 Typical atrial flutter: Secondary | ICD-10-CM

## 2019-11-26 DIAGNOSIS — R55 Syncope and collapse: Secondary | ICD-10-CM

## 2019-11-26 DIAGNOSIS — I4892 Unspecified atrial flutter: Secondary | ICD-10-CM

## 2019-11-26 NOTE — Telephone Encounter (Signed)
Enrolled patient for a 30 day [employer] Event monitor to be mailed to patients home.  

## 2019-11-26 NOTE — Telephone Encounter (Signed)
Spoke to patient about recent email.Stated he is not wearing abdominal binder and not wearing compression socks.Stated they are uncomfortable and just cannot wear.Stated he will start checking B/P when he has a episode.He just bought a B/P monitor.Dr.Jordan advised to wear a 30 day event monitor off of [employer].Order placed.Follow up appointment scheduled with Dr.Jordan 01/03/20 at 10:40 am.

## 2019-12-01 ENCOUNTER — Ambulatory Visit (INDEPENDENT_AMBULATORY_CARE_PROVIDER_SITE_OTHER): Payer: Medicare Other | Admitting: Neurology

## 2019-12-01 ENCOUNTER — Ambulatory Visit (INDEPENDENT_AMBULATORY_CARE_PROVIDER_SITE_OTHER): Payer: Medicare Other

## 2019-12-01 ENCOUNTER — Other Ambulatory Visit: Payer: Self-pay

## 2019-12-01 ENCOUNTER — Encounter: Payer: Self-pay | Admitting: Neurology

## 2019-12-01 VITALS — Temp 97.0°F | Ht 68.0 in | Wt 214.0 lb

## 2019-12-01 DIAGNOSIS — G90522 Complex regional pain syndrome I of left lower limb: Secondary | ICD-10-CM

## 2019-12-01 DIAGNOSIS — R55 Syncope and collapse: Secondary | ICD-10-CM | POA: Diagnosis not present

## 2019-12-01 DIAGNOSIS — Z9889 Other specified postprocedural states: Secondary | ICD-10-CM | POA: Diagnosis not present

## 2019-12-01 DIAGNOSIS — I4892 Unspecified atrial flutter: Secondary | ICD-10-CM

## 2019-12-01 DIAGNOSIS — H04123 Dry eye syndrome of bilateral lacrimal glands: Secondary | ICD-10-CM | POA: Diagnosis not present

## 2019-12-01 DIAGNOSIS — H2513 Age-related nuclear cataract, bilateral: Secondary | ICD-10-CM | POA: Diagnosis not present

## 2019-12-01 DIAGNOSIS — S0230XD Fracture of orbital floor, unspecified side, subsequent encounter for fracture with routine healing: Secondary | ICD-10-CM | POA: Diagnosis not present

## 2019-12-01 NOTE — Progress Notes (Addendum)
Reason for visit: Left leg pain, recurrent syncope  Frederick Payne is an 59 y.o. male  History of present illness:  Frederick Payne is a 59 year old right-handed white male with a history of left leg pain that is felt to be neuropathic in nature.  He was sent to Dr. Hardin Negus for a pain evaluation, Dr. Hardin Negus did not want to treat him because he was on Coumadin.  The patient has an artificial valve and has a IVC filter in place.  The patient has continued to have episodes of syncope, he is having one every 2 to 3 weeks, he has been noted to have events of bradycardia with heart rates going down into the upper 30s.  He is on metoprolol, he is followed by Dr. Martinique from cardiology.  He will be undergoing a heart monitor study off of metoprolol in the near future.  The patient has what sounds like vasovagal syncope with a sensation of dizziness, reduced hearing, nausea, diaphoresis, and then syncope.  He is able to prevent a blackout by lying down.  In 2018, he had an MRI of the brain that showed a small pontine infarct.  The patient returns to the office today for an evaluation.  Past Medical History:  Diagnosis Date  . Aortic stenosis    Had surgery performed  . CAD (coronary artery disease)   . Chronic anemia   . Deep vein thrombosis ([place]) 2006   After a broken ankle--recently diagnosed with reflex sympathetic dystrophy of the left lower extremity  . Elevated LFTs    h/o excessive alcohol  . Factor V Leiden ([place])   . Hypertension   . Ischemic heart disease   . Psychiatric disorder    Underlying  . PUD (peptic ulcer disease)    with bleeding  . Reflex sympathetic dystrophy     Past Surgical History:  Procedure Laterality Date  . AORTIC VALVE REPLACEMENT  07/15/12   27 mm St Jude  . CORONARY ARTERY BYPASS GRAFT     x1  . GALLBLADDER SURGERY  2008  . gastrectomy and vagotomy    . greenfield filter      Family History  Problem Relation Age of Onset  . Healthy Father   . Stroke  Paternal Grandmother     Social history:  reports that he has quit smoking. He has never used smokeless tobacco. He reports that he does not drink alcohol or use drugs.    Allergies  Allergen Reactions  . Benzodiazepines Other (See Comments)    Causes pt to get violent   . Contrast Media [Iodinated Diagnostic Agents] Hives  . Iodine Anaphylaxis  . Other Anaphylaxis    FRUITS FROM TREES.   . Pectin Anaphylaxis  . Shellfish Allergy Anaphylaxis  . Sulfa Antibiotics Anaphylaxis  . Diazepam Other (See Comments)    Headache, strange behavior, memory loss  . Lactose Intolerance (Gi) Nausea And Vomiting    Medications:  Prior to Admission medications   Medication Sig Start Date End Date Taking? Authorizing Provider  albuterol (VENTOLIN HFA) 108 (90 Base) MCG/ACT inhaler INHALE 2 PUFFS BY MOUTH EVERY 4 TO 6 HOURS AS NEEDED 10/04/19  Yes Denita Lung, MD  ascorbic acid (VITAMIN C) 500 MG tablet Take 500 mg by mouth daily.   Yes [provider]  aspirin 81 MG tablet Take 81 mg by mouth daily.   Yes [provider]  atorvastatin (LIPITOR) 20 MG tablet Take 1 tablet (20 mg total) by mouth daily.  04/01/19 03/31/20 Yes Denita Lung, MD  COAGUCHEK LANCETS MISC 1 each by Does not apply route as directed. 02/14/16  Yes Brunetta Genera, MD  ferrous sulfate 325 (65 FE) MG EC tablet Take by mouth.   Yes [provider]  fish oil-omega-3 fatty acids 1000 MG capsule Take 1 g by mouth daily.   Yes [provider]  Fluticasone Furoate (ARNUITY ELLIPTA) 100 MCG/ACT AEPB Inhale 1 puff into the lungs daily. 04/23/19  Yes Denita Lung, MD  L-Methylfolate-Algae (DEPLIN 15) 302 308 9397 MG CAPS Take by mouth.   Yes [provider]  metoprolol succinate (TOPROL XL) 25 MG 24 hr tablet Take 1 tablet (25 mg total) by mouth daily. 09/08/19  Yes Martinique, Peter M, MD  mometasone (ASMANEX, 60 METERED DOSES,) 220 MCG/INH inhaler Inhale 2 puffs into the lungs daily. 04/01/19   Yes Denita Lung, MD  Multiple Vitamins-Minerals (ONE-A-DAY MENS 50+ ADVANTAGE PO) Take by mouth daily.   Yes [provider]  POLICOSANOL PO Take 20 mg by mouth daily.   Yes [provider]  POLY-IRON 150 150 MG capsule TAKE 1 CAPSULE BY MOUTH EVERY DAY 02/04/19  Yes Denita Lung, MD  PT/INR Test [facility] PT TEST) STRP 1 each by In Vitro route as directed. 02/14/16  Yes Brunetta Genera, MD  PT/INR Testing Monitor (COAGUCHEK XS PLUS SYSTEM) KIT 1 each by Does not apply route as directed. 02/14/16  Yes Brunetta Genera, MD  S-Adenosylmethionine (SAM-E) 400 MG TABS Take 1 tablet by mouth daily.   Yes [provider]  [facility] Wort 300 MG CAPS Take 300 mg by mouth 2 (two) times daily.   Yes [provider]  Thiamine HCl (B-1) 100 MG TABS Take by mouth.   Yes [provider]  Vitamins/Minerals TABS Take by mouth.   Yes [provider]  warfarin (COUMADIN) 10 MG tablet TAKE 1 TABLET(10 MG) BY MOUTH DAILY 09/15/19  Yes Denita Lung, MD  warfarin (COUMADIN) 2 MG tablet TAKE 1 TABLET BY MOUTH DAILY 01/08/16  Yes Denita Lung, MD  warfarin (COUMADIN) 5 MG tablet Take 1 tablet (5 mg total) by mouth daily. 07/14/18  Yes Denita Lung, MD    ROS:  Out of a complete 14 system review of symptoms, the patient complains only of the following symptoms, and all other reviewed systems are negative.  Left leg pain Blacking out Palpitations of the heart  Temperature (!) 97 F (36.1 C), height _0  (1.727 m), weight 214 lb (97.1 kg).  Physical Exam  General: The patient is alert and cooperative at the time of the examination.  Skin: No significant peripheral edema is noted.   Neurologic Exam  Mental status: The patient is alert and oriented x 3 at the time of the examination. The patient has apparent normal recent and remote memory, with an apparently normal attention span and concentration ability.   Cranial nerves: Facial  symmetry is present. Speech is normal, no aphasia or dysarthria is noted. Extraocular movements are full. Visual fields are full.  Motor: The patient has good strength in all 4 extremities.  Sensory examination: Soft touch sensation is symmetric on the face, arms, and legs.  Coordination: The patient has good finger-nose-finger and heel-to-shin bilaterally.  Gait and station: The patient has a normal gait. Tandem gait is slightly unsteady. Romberg is negative. No drift is seen.  Reflexes: Deep tendon reflexes are symmetric.   Assessment/Plan:  1.  Left leg  pain, possible complex regional pain syndrome  2.  Recurrent syncope, likely vasovagal syncope  The patient be sent for carotid Doppler study given the syncopal events, he is allergic to x-ray dye.  The patient will be referred to [facility] for the left leg pain.  The patient should be able to have nerve block procedures but may have to be placed on Lovenox in transition.  He will follow-up here in 6 months.  Jill Alexanders MD 12/01/2019 10:44 AM  [facility] [address] [place], [place] [address]  Phone [phone] Fax [phone]

## 2019-12-06 ENCOUNTER — Telehealth: Payer: Self-pay

## 2019-12-06 NOTE — Telephone Encounter (Signed)
Received monitor strips from 12/05/19 at 3:57 pm revealed 6 beat run of V Tach, rate 195.Spoke to patient he was aware.He did not pass out.Dr.Jordan reviewed.He advised no changes,continue to wear monitor.Stated he saw neurologist and will be having a carotid doppler this Fri.12/10/19.

## 2019-12-09 DIAGNOSIS — M545 Low back pain: Secondary | ICD-10-CM | POA: Diagnosis not present

## 2019-12-09 DIAGNOSIS — M47816 Spondylosis without myelopathy or radiculopathy, lumbar region: Secondary | ICD-10-CM | POA: Diagnosis not present

## 2019-12-09 DIAGNOSIS — G90522 Complex regional pain syndrome I of left lower limb: Secondary | ICD-10-CM | POA: Diagnosis not present

## 2019-12-09 DIAGNOSIS — G894 Chronic pain syndrome: Secondary | ICD-10-CM | POA: Diagnosis not present

## 2019-12-10 ENCOUNTER — Ambulatory Visit (HOSPITAL_COMMUNITY)
Admission: RE | Admit: 2019-12-10 | Discharge: 2019-12-10 | Disposition: A | Discharge status: Home / Self Care | Payer: Medicare Other | Source: Ambulatory Visit | Attending: Neurology | Admitting: Neurology

## 2019-12-10 ENCOUNTER — Other Ambulatory Visit: Payer: Self-pay

## 2019-12-10 DIAGNOSIS — R55 Syncope and collapse: Secondary | ICD-10-CM | POA: Insufficient documentation

## 2019-12-12 ENCOUNTER — Telehealth: Payer: Self-pay | Admitting: Neurology

## 2019-12-12 NOTE — Telephone Encounter (Addendum)
I called the patient.  Carotid Doppler study appears to be unremarkable.   Carotid doppler 12/10/19:  Summary: Right Carotid: Velocities in the right ICA are consistent with a 1-39% stenosis.  Left Carotid: Velocities in the left ICA are consistent with a 1-39% stenosis.  Vertebrals:  Bilateral vertebral arteries demonstrate antegrade flow. Subclavians: Normal flow hemodynamics were seen in bilateral subclavian              arteries.

## 2019-12-14 ENCOUNTER — Other Ambulatory Visit: Payer: Self-pay | Admitting: Family Medicine

## 2019-12-14 DIAGNOSIS — D6851 Activated protein C resistance: Secondary | ICD-10-CM

## 2019-12-14 DIAGNOSIS — Z7901 Long term (current) use of anticoagulants: Secondary | ICD-10-CM

## 2019-12-20 NOTE — Telephone Encounter (Signed)
Let's get the complete report from his event monitor then schedule him to be seen.  Shivonne Schwartzman Martinique MD, [facility]

## 2019-12-21 ENCOUNTER — Encounter: Payer: Self-pay | Admitting: Cardiology

## 2019-12-21 ENCOUNTER — Encounter: Payer: Self-pay | Admitting: Family Medicine

## 2019-12-21 ENCOUNTER — Ambulatory Visit (INDEPENDENT_AMBULATORY_CARE_PROVIDER_SITE_OTHER): Payer: Medicare Other | Admitting: Cardiology

## 2019-12-21 ENCOUNTER — Other Ambulatory Visit: Payer: Self-pay

## 2019-12-21 VITALS — BP 151/96 | HR 83 | Temp 97.5°F | Ht 66.0 in | Wt 214.0 lb

## 2019-12-21 DIAGNOSIS — I491 Atrial premature depolarization: Secondary | ICD-10-CM | POA: Diagnosis not present

## 2019-12-21 DIAGNOSIS — R55 Syncope and collapse: Secondary | ICD-10-CM

## 2019-12-21 DIAGNOSIS — I483 Typical atrial flutter: Secondary | ICD-10-CM | POA: Diagnosis not present

## 2019-12-21 DIAGNOSIS — I493 Ventricular premature depolarization: Secondary | ICD-10-CM | POA: Diagnosis not present

## 2019-12-21 DIAGNOSIS — I2510 Atherosclerotic heart disease of native coronary artery without angina pectoris: Secondary | ICD-10-CM

## 2019-12-21 DIAGNOSIS — Z952 Presence of prosthetic heart valve: Secondary | ICD-10-CM | POA: Diagnosis not present

## 2019-12-21 DIAGNOSIS — I10 Essential (primary) hypertension: Secondary | ICD-10-CM | POA: Diagnosis not present

## 2019-12-21 MED ORDER — METOPROLOL SUCCINATE ER 50 MG PO TB24: 50.0000 mg | ORAL_TABLET | Freq: Every day | ORAL | 3 refills | Status: DC

## 2019-12-21 NOTE — Progress Notes (Signed)
Cardiology Office Note    Date:  12/21/2019   ID:  Frederick Payne, DOB 11/01/1960, MRN [id number]  PCP:  Frederick Nian, MD  Cardiologist:  Dr. [place]   Chief Complaint  Patient presents with  . Palpitations  . Loss of Consciousness    History of Present Illness:  Frederick Payne is a 59 y.o. male seen for follow up today. He has a  PMH of CAD, aortic valve disease s/p mechanical AVR with Bentall procedure, prior atrial flutter on coumadin, EtOH abuse, factor V Leiden deficiency, history of DVT after ankle fracture, hypertension, and PVD. He previously had a DVT of his left leg.  He had IVC filter in place due to 3 separate episodes of PEs.  He underwent aortic valve replacement in July 2014 with St Jude mechanical valve along with single-vessel CABG. Echocardiogram obtained in January 2017 showed EF 55 to 60%, normal-appearing mechanical AVR with stable trivial perivalvular regurgitation.  He was admitted at [facility] with typical atrial flutter in April 2017 and was cardioverted at the time.  Repeat cardiac cath done at the time demonstrated nonobstructive CAD.  Previous heart monitor in July 2017 showed PACs and PVCs, no A. fib or a flutter.  He had a chest pain   that was evaluated at the [facility] in June 2018.  Myoview study at the time was negative.  CT of abdomen pelvis showed no compromise of IVC filter.  He underwent sleep study in August 2020 that showed mild obstructive sleep apnea, conservative management including observation and weight loss was recommended.  In September 2020 he had significant palpitation  that was accompanied by a passing out episode on 9/17.  EMS came, however he refused to go to the ED. he says he did have some pain in the chest that was accompanied by sharp pounding sensation.  The chest pain only lasted about a second before going away however recurred 4 different times along with a feeling of skipped heartbeat and heart pounding.  On the fourth  time, he lost hearing and subsequently passed out in the chair.  This was witnessed by his mother-in-law at the time and his mother-in-law was the one who called EMS.  He only had symptoms one time before however that was during emotional and physical exertion when his neighbors pitbull killed his cat and he chased after the pitbull. He was seen by Azalee Course PA-C in October. Lab work done and [employer] and CBC were normal. Echo showed normal LV function with LVH. Aorta dilated to 4.2 cm. RV enlargement. Normal functioning AV prosthesis.   When seen in November he had 3 other episodes of syncope since his initial evaluation. All episodes except one occurred while watching TV and sitting in a chair. He initially feels nausea, loss of hearing, sweating then everything goes black. He feels like his life force is draining away. His partner notes he has an empty stare. No jerking. No incontinence. Symptoms resolve after a period of lying down. No clear triggers. States partner checked BP during an episode and it dropped to 70/47 and HR dropped to less than 40.  He was wearing a monitor with his last episode without significant arrhythmia.  Event monitor in November was completely benign without PVCs or bradycardia/pauses. Echo was satisfactory. Carotid dopplers were negative.  We reduced his Toprol dose but he complained of increased palpitations so he wanted to wear a monitor while off Toprol so we could see what was going on.  He also saw EP-Dr Graciela Husbands for evaluation of his syncope and was felt to have classic vasovagal syncope. He recommended CUPII and compression hose, thigh sleeves and abdominal binder. He felt comfortable with continuing Toprol if patient felt better on it. Event monitor to date off Toprol has demonstrated some PVCs with bigeminy. 2 strips with sinus tachycardia to 117 with PACs. One 6 beat run NSVT.   On evaluation today he states he feels terrible. Notes that since stopping Toprol he has more  palpitations, tachycardia, and feeling weak and trembling. Last night did not sleep well. Felt heart pounding and notes oxygen sat at 93%. Felt like something sitting on his chest.   Past Medical History:  Diagnosis Date  . Aortic stenosis    Had surgery performed  . CAD (coronary artery disease)   . Chronic anemia   . Deep vein thrombosis (HCC) 2006   After a broken ankle--recently diagnosed with reflex sympathetic dystrophy of the left lower extremity  . Elevated LFTs    h/o excessive alcohol  . Factor V Leiden (HCC)   . Hypertension   . Ischemic heart disease   . Psychiatric disorder    Underlying  . PUD (peptic ulcer disease)    with bleeding  . Reflex sympathetic dystrophy     Past Surgical History:  Procedure Laterality Date  . AORTIC VALVE REPLACEMENT  07/15/12   27 mm St Jude  . CORONARY ARTERY BYPASS GRAFT     x1  . GALLBLADDER SURGERY  2008  . gastrectomy and vagotomy    . greenfield filter      Current Medications: Outpatient Medications Prior to Visit  Medication Sig Dispense Refill  . albuterol (VENTOLIN HFA) 108 (90 Base) MCG/ACT inhaler INHALE 2 PUFFS BY MOUTH EVERY 4 TO 6 HOURS AS NEEDED 18 g 0  . ascorbic acid (VITAMIN C) 500 MG tablet Take 500 mg by mouth daily.    [name] aspirin 81 MG tablet Take 81 mg by mouth daily.    [name] atorvastatin (LIPITOR) 20 MG tablet Take 1 tablet (20 mg total) by mouth daily. 90 tablet 3  . COAGUCHEK LANCETS MISC 1 each by Does not apply route as directed. 200 each 1  . FEROSUL 325 (65 Fe) MG tablet TAKE 1 TABLET BY MOUTH EVERY DAY 90 tablet 0  . ferrous sulfate 325 (65 FE) MG EC tablet Take by mouth.    . fish oil-omega-3 fatty acids 1000 MG capsule Take 1 g by mouth daily.    . Fluticasone Furoate (ARNUITY ELLIPTA) 100 MCG/ACT AEPB Inhale 1 puff into the lungs daily. 30 each 11  . L-Methylfolate-Algae (DEPLIN 15) 15-90.314 MG CAPS Take by mouth.    . mometasone (ASMANEX, 60 METERED DOSES,) 220 MCG/INH inhaler Inhale 2 puffs into  the lungs daily. 1 Inhaler 12  . Multiple Vitamins-Minerals (ONE-A-DAY MENS 50+ ADVANTAGE PO) Take by mouth daily.    [name] POLICOSANOL PO Take 20 mg by mouth daily.    [name] POLY-IRON 150 150 MG capsule TAKE 1 CAPSULE BY MOUTH EVERY DAY 90 capsule 3  . PT/INR Test (COAGUCHEK PT TEST) STRP 1 each by In Vitro route as directed. 48 each 1  . PT/INR Testing Monitor (COAGUCHEK XS PLUS SYSTEM) KIT 1 each by Does not apply route as directed. 1 kit 0  . S-Adenosylmethionine (SAM-E) 400 MG TABS Take 1 tablet by mouth daily.    . St Johns Wort 300 MG CAPS Take 300 mg by mouth 2 (two) times daily.    [name]  Thiamine HCl (B-1) 100 MG TABS Take by mouth.    . Vitamins/Minerals TABS Take by mouth.    . warfarin (COUMADIN) 10 MG tablet TAKE 1 TABLET(10 MG) BY MOUTH DAILY 90 tablet 0  . warfarin (COUMADIN) 2 MG tablet TAKE 1 TABLET BY MOUTH DAILY 30 tablet 2  . warfarin (COUMADIN) 5 MG tablet Take 1 tablet (5 mg total) by mouth daily. 90 tablet 1  . metoprolol succinate (TOPROL XL) 25 MG 24 hr tablet Take 1 tablet (25 mg total) by mouth daily.     No facility-administered medications prior to visit.     Allergies:   Benzodiazepines, Contrast media [iodinated diagnostic agents], Iodine, Other, Pectin, Shellfish allergy, Sulfa antibiotics, Diazepam, and Lactose intolerance (gi)   Social History   Socioeconomic History  . Marital status: Media planner    Spouse name: Not on file  . Number of children: 2  . Years of education: Not on file  . Highest education level: Not on file  Occupational History  . Occupation: finance-disabled  Tobacco Use  . Smoking status: Former Games developer  . Smokeless tobacco: Never Used  . Tobacco comment: quit 15 + yrs ago  Substance and Sexual Activity  . Alcohol use: No    Alcohol/week: 0.0 standard drinks  . Drug use: No  . Sexual activity: Yes    Partners: Male  Other Topics Concern  . Not on file  Social History Narrative   Lives at home w/ his partner, Susann Givens and his  elderly mother   Right-handed   Drinks about 3 cups of tea per day   Social Determinants of Health   Financial Resource Strain:   . Difficulty of Paying Living Expenses: Not on file  Food Insecurity:   . Worried About Programme researcher, broadcasting/film/video in the Last Year: Not on file  . Ran Out of Food in the Last Year: Not on file  Transportation Needs:   . Lack of Transportation (Medical): Not on file  . Lack of Transportation (Non-Medical): Not on file  Physical Activity:   . Days of Exercise per Week: Not on file  . Minutes of Exercise per Session: Not on file  Stress:   . Feeling of Stress : Not on file  Social Connections:   . Frequency of Communication with Friends and Family: Not on file  . Frequency of Social Gatherings with Friends and Family: Not on file  . Attends Religious Services: Not on file  . Active Member of Clubs or Organizations: Not on file  . Attends Banker Meetings: Not on file  . Marital Status: Not on file     Family History:  The patient's family history includes Healthy in his father; Stroke in his paternal grandmother.   ROS:   Please see the history of present illness.    ROS All other systems reviewed and are negative.   PHYSICAL EXAM:   VS:  BP (!) 151/96   Pulse 83   Temp (!) 97.5 F (36.4 C)   Ht 5\' 6"  (1.676 m)   Wt 214 lb (97.1 kg)   SpO2 99%   BMI 34.54 kg/m    GEN: Well nourished, well developed, in no acute distress  HEENT: normal  Neck: no JVD, carotid bruits, or masses Cardiac: RRR; no murmurs, rubs, or gallops,no edema. Good mechanical AV click. Respiratory:  clear to auscultation bilaterally, normal work of breathing GI: soft, nontender, nondistended, + BS MS: no deformity or atrophy  Skin: warm  and dry, no rash Neuro:  Alert and Oriented x 3, Strength and sensation are intact Psych: euthymic mood, full affect  Wt Readings from Last 3 Encounters:  12/21/19 214 lb (97.1 kg)  12/01/19 214 lb (97.1 kg)  10/28/19 217 lb  6.4 oz (98.6 kg)      Studies/Labs Reviewed:   EKG:  EKG is not ordered today.     Recent Labs: 04/01/2019: ALT 50 08/03/2019: BUN 12; Creatinine, Ser 1.00; Hemoglobin 15.3; Platelets 149; Potassium 4.5; Sodium 142   Lipid Panel    Component Value Date/Time   CHOL 207 (H) 04/01/2019 1124   TRIG 153 (H) 04/01/2019 1124   HDL 39 (L) 04/01/2019 1124   CHOLHDL 5.3 (H) 04/01/2019 1124   LDLCALC 137 (H) 04/01/2019 1124    Additional studies/ records that were reviewed today include:   Echo 11/13/2015 LV EF: 55% -   60% Study Conclusions  - Left ventricle: The cavity size was normal. Wall thickness was   normal. Systolic function was normal. The estimated ejection   fraction was in the range of 55% to 60%. - Aortic valve: Normal appearing mechanical AVR with stable trivial   peri valvular regurgitation. Valve area (VTI): 1.74 cm^2. Valve   area (Vmax): 1.8 cm^2. Valve area (Vmean): 1.71 cm^2. - Left atrium: The atrium was mildly dilated. - Atrial septum: No defect or patent foramen ovale was identified.  Echo 08/06/19: IMPRESSIONS    1. Left ventricular ejection fraction, by visual estimation, is 60 to 65%. The left ventricle has normal function. Normal left ventricular size. There is moderately increased left ventricular hypertrophy.  2. Left ventricular diastolic Doppler parameters are indeterminate pattern of LV diastolic filling.  3. Global right ventricle has moderately reduced systolic function.The right ventricular size is mildly enlarged. No increase in right ventricular wall thickness.  4. Left atrial size was normal.  5. Right atrial size was normal.  6. The mitral valve is normal in structure. No evidence of mitral valve regurgitation. No evidence of mitral stenosis.  7. The tricuspid valve is normal in structure. Tricuspid valve regurgitation was not visualized by color flow Doppler.  8. Aortic valve regurgitation is trivial by color flow Doppler.  9. Bileaflet  mechanical prosthesis in the aortic valve position. 10. The pulmonic valve was normal in structure. Pulmonic valve regurgitation is not visualized by color flow Doppler. 11. Aortic dilatation noted. 12. There is moderate dilatation of the ascending aorta measuring 42 mm. 13. TR signal is inadequate for assessing pulmonary artery systolic pressure.   ASSESSMENT:    No diagnosis found.   PLAN:  In order of problems listed above:  1. Syncope:  most consistent with vasovagal syncope. He has had several episodes while wearing a monitor without significant associated arrhythmia. Symptoms occur both on and off Toprol. Seen by Dr Graciela Husbands who agreed these episodes were vasovagal.  Will resume Toprol XL.  Recommend liberal salt intake. Wear compression hose. Counseled on methods of avoidance. He is not interested in abdominal binders or thigh sleeves. Found compression hose uncomfortable.   2. History of mechanical AVR and Bentall procedure: Echo shows good valve function.  3. Atrial flutter: Maintaining sinus rhythm.  Continue Coumadin.  4. Factor V Leiden: On Coumadin  5. Hypertension: Blood pressure is elevated today. Will monitor with resumption of Toprol.   6. CAD: Nonobstructive CAD on previous cardiac cath.  7. Hyperlipidemia: on lipitor  8.   PVCs, PACs. One run of NSVT. Explained that these arrhythmias, while  symptomatic, are benign. Will resume Toprol XL 50 mg daily. I would not recommend AAD therapy. We have seen previously that Toprol offered good control of ectopy and we have also demonstrated that Toprol has no effect on his vasovagal syncope.   Follow up in 3 months.      Medication Adjustments/Labs and Tests Ordered: Current medicines are reviewed at length with the patient today.  Concerns regarding medicines are outlined above.  Medication changes, Labs and Tests ordered today are listed in the Patient Instructions below. There are no Patient Instructions on file for this  visit.   Signed, Calen Posch Sw[place], MD  12/21/2019 5:08 PM    [facility] [address] [place], [place], [place]  [address] Phone: [phone]; Fax: [phone]

## 2019-12-23 ENCOUNTER — Ambulatory Visit: Payer: Medicare Other | Admitting: Cardiology

## 2019-12-28 DIAGNOSIS — Z23 Encounter for immunization: Secondary | ICD-10-CM | POA: Diagnosis not present

## 2020-01-03 ENCOUNTER — Ambulatory Visit: Payer: Medicare Other | Admitting: Cardiology

## 2020-01-05 DIAGNOSIS — D0422 Carcinoma in situ of skin of left ear and external auricular canal: Secondary | ICD-10-CM | POA: Diagnosis not present

## 2020-01-05 DIAGNOSIS — D044 Carcinoma in situ of skin of scalp and neck: Secondary | ICD-10-CM | POA: Diagnosis not present

## 2020-01-05 DIAGNOSIS — L821 Other seborrheic keratosis: Secondary | ICD-10-CM | POA: Diagnosis not present

## 2020-01-05 DIAGNOSIS — Z85828 Personal history of other malignant neoplasm of skin: Secondary | ICD-10-CM | POA: Diagnosis not present

## 2020-01-05 DIAGNOSIS — D485 Neoplasm of uncertain behavior of skin: Secondary | ICD-10-CM | POA: Diagnosis not present

## 2020-01-05 DIAGNOSIS — L570 Actinic keratosis: Secondary | ICD-10-CM | POA: Diagnosis not present

## 2020-01-10 DIAGNOSIS — Z954 Presence of other heart-valve replacement: Secondary | ICD-10-CM | POA: Diagnosis not present

## 2020-01-10 DIAGNOSIS — Z951 Presence of aortocoronary bypass graft: Secondary | ICD-10-CM | POA: Diagnosis not present

## 2020-01-10 DIAGNOSIS — I483 Typical atrial flutter: Secondary | ICD-10-CM | POA: Diagnosis not present

## 2020-01-10 DIAGNOSIS — D682 Hereditary deficiency of other clotting factors: Secondary | ICD-10-CM | POA: Diagnosis not present

## 2020-01-10 DIAGNOSIS — I2510 Atherosclerotic heart disease of native coronary artery without angina pectoris: Secondary | ICD-10-CM | POA: Diagnosis not present

## 2020-01-20 DIAGNOSIS — Z954 Presence of other heart-valve replacement: Secondary | ICD-10-CM | POA: Diagnosis not present

## 2020-01-20 DIAGNOSIS — Z91041 Radiographic dye allergy status: Secondary | ICD-10-CM | POA: Diagnosis not present

## 2020-01-20 DIAGNOSIS — F4310 Post-traumatic stress disorder, unspecified: Secondary | ICD-10-CM | POA: Diagnosis not present

## 2020-01-20 DIAGNOSIS — Z87891 Personal history of nicotine dependence: Secondary | ICD-10-CM | POA: Diagnosis not present

## 2020-01-20 DIAGNOSIS — I2510 Atherosclerotic heart disease of native coronary artery without angina pectoris: Secondary | ICD-10-CM | POA: Diagnosis not present

## 2020-01-20 DIAGNOSIS — Z952 Presence of prosthetic heart valve: Secondary | ICD-10-CM | POA: Diagnosis not present

## 2020-01-20 DIAGNOSIS — Z91011 Allergy to milk products: Secondary | ICD-10-CM | POA: Diagnosis not present

## 2020-01-20 DIAGNOSIS — Z7982 Long term (current) use of aspirin: Secondary | ICD-10-CM | POA: Diagnosis not present

## 2020-01-20 DIAGNOSIS — I472 Ventricular tachycardia: Secondary | ICD-10-CM | POA: Diagnosis not present

## 2020-01-20 DIAGNOSIS — R55 Syncope and collapse: Secondary | ICD-10-CM | POA: Diagnosis not present

## 2020-01-20 DIAGNOSIS — Z882 Allergy status to sulfonamides status: Secondary | ICD-10-CM | POA: Diagnosis not present

## 2020-01-20 DIAGNOSIS — Z7901 Long term (current) use of anticoagulants: Secondary | ICD-10-CM | POA: Diagnosis not present

## 2020-01-20 DIAGNOSIS — F329 Major depressive disorder, single episode, unspecified: Secondary | ICD-10-CM | POA: Diagnosis not present

## 2020-01-20 DIAGNOSIS — I484 Atypical atrial flutter: Secondary | ICD-10-CM | POA: Diagnosis not present

## 2020-01-20 DIAGNOSIS — Z86718 Personal history of other venous thrombosis and embolism: Secondary | ICD-10-CM | POA: Diagnosis not present

## 2020-01-20 DIAGNOSIS — Z951 Presence of aortocoronary bypass graft: Secondary | ICD-10-CM | POA: Diagnosis not present

## 2020-01-20 DIAGNOSIS — Z79899 Other long term (current) drug therapy: Secondary | ICD-10-CM | POA: Diagnosis not present

## 2020-01-20 DIAGNOSIS — Z888 Allergy status to other drugs, medicaments and biological substances status: Secondary | ICD-10-CM | POA: Diagnosis not present

## 2020-01-20 DIAGNOSIS — Z91013 Allergy to seafood: Secondary | ICD-10-CM | POA: Diagnosis not present

## 2020-01-20 DIAGNOSIS — Z86711 Personal history of pulmonary embolism: Secondary | ICD-10-CM | POA: Diagnosis not present

## 2020-01-20 DIAGNOSIS — D6851 Activated protein C resistance: Secondary | ICD-10-CM | POA: Diagnosis not present

## 2020-01-24 ENCOUNTER — Other Ambulatory Visit: Payer: Self-pay | Admitting: Family Medicine

## 2020-01-24 NOTE — Telephone Encounter (Signed)
[facility] is requesting to fill pt albuterol. Please advise [facility] 

## 2020-01-25 DIAGNOSIS — Z23 Encounter for immunization: Secondary | ICD-10-CM | POA: Diagnosis not present

## 2020-01-26 ENCOUNTER — Encounter: Payer: Self-pay | Admitting: Family Medicine

## 2020-01-27 ENCOUNTER — Ambulatory Visit: Payer: Medicare Other | Admitting: Cardiology

## 2020-02-08 ENCOUNTER — Telehealth: Payer: Self-pay

## 2020-02-08 NOTE — Telephone Encounter (Signed)
It is written as generic so should not be an issue.  Clear that with the pharmacy.

## 2020-02-08 NOTE — Telephone Encounter (Signed)
I called the pharmacy and they said the pt. Did pick up one last script on the albuterol on 01/25/20 but next time it was refilled it would have to be sent in as the ventolin.

## 2020-02-08 NOTE — Telephone Encounter (Signed)
I received a letter from the pts. [place] stating that his Albuterol inhaler is not going to be covered anymore but the covered alternative is the Ventolin inhaler if you wanted to change to that inhaler. They did give him a temporary supply on 01/24/20 of his albuterol until this issue is resolved.

## 2020-02-10 DIAGNOSIS — I2510 Atherosclerotic heart disease of native coronary artery without angina pectoris: Secondary | ICD-10-CM | POA: Diagnosis not present

## 2020-02-10 DIAGNOSIS — D682 Hereditary deficiency of other clotting factors: Secondary | ICD-10-CM | POA: Diagnosis not present

## 2020-02-10 DIAGNOSIS — I483 Typical atrial flutter: Secondary | ICD-10-CM | POA: Diagnosis not present

## 2020-02-10 DIAGNOSIS — Z951 Presence of aortocoronary bypass graft: Secondary | ICD-10-CM | POA: Diagnosis not present

## 2020-02-10 DIAGNOSIS — Z9889 Other specified postprocedural states: Secondary | ICD-10-CM | POA: Diagnosis not present

## 2020-02-10 DIAGNOSIS — R55 Syncope and collapse: Secondary | ICD-10-CM | POA: Diagnosis not present

## 2020-02-10 DIAGNOSIS — Z954 Presence of other heart-valve replacement: Secondary | ICD-10-CM | POA: Diagnosis not present

## 2020-02-16 ENCOUNTER — Ambulatory Visit (INDEPENDENT_AMBULATORY_CARE_PROVIDER_SITE_OTHER): Payer: Medicare Other | Admitting: Family Medicine

## 2020-02-16 ENCOUNTER — Encounter: Payer: Self-pay | Admitting: Family Medicine

## 2020-02-16 ENCOUNTER — Other Ambulatory Visit: Payer: Self-pay

## 2020-02-16 VITALS — BP 152/92 | HR 95 | Temp 99.1°F | Ht 67.5 in | Wt 216.6 lb

## 2020-02-16 DIAGNOSIS — G90522 Complex regional pain syndrome I of left lower limb: Secondary | ICD-10-CM

## 2020-02-16 DIAGNOSIS — J45909 Unspecified asthma, uncomplicated: Secondary | ICD-10-CM | POA: Diagnosis not present

## 2020-02-16 DIAGNOSIS — N5201 Erectile dysfunction due to arterial insufficiency: Secondary | ICD-10-CM

## 2020-02-16 DIAGNOSIS — Z952 Presence of prosthetic heart valve: Secondary | ICD-10-CM

## 2020-02-16 DIAGNOSIS — D682 Hereditary deficiency of other clotting factors: Secondary | ICD-10-CM

## 2020-02-16 DIAGNOSIS — Z86711 Personal history of pulmonary embolism: Secondary | ICD-10-CM

## 2020-02-16 DIAGNOSIS — I2510 Atherosclerotic heart disease of native coronary artery without angina pectoris: Secondary | ICD-10-CM

## 2020-02-16 DIAGNOSIS — E538 Deficiency of other specified B group vitamins: Secondary | ICD-10-CM

## 2020-02-16 DIAGNOSIS — I483 Typical atrial flutter: Secondary | ICD-10-CM

## 2020-02-16 DIAGNOSIS — H9113 Presbycusis, bilateral: Secondary | ICD-10-CM

## 2020-02-16 DIAGNOSIS — Z951 Presence of aortocoronary bypass graft: Secondary | ICD-10-CM

## 2020-02-16 DIAGNOSIS — J3089 Other allergic rhinitis: Secondary | ICD-10-CM

## 2020-02-16 DIAGNOSIS — Z1211 Encounter for screening for malignant neoplasm of colon: Secondary | ICD-10-CM | POA: Diagnosis not present

## 2020-02-16 DIAGNOSIS — F325 Major depressive disorder, single episode, in full remission: Secondary | ICD-10-CM

## 2020-02-16 DIAGNOSIS — F1121 Opioid dependence, in remission: Secondary | ICD-10-CM

## 2020-02-16 DIAGNOSIS — G4733 Obstructive sleep apnea (adult) (pediatric): Secondary | ICD-10-CM

## 2020-02-16 DIAGNOSIS — F1011 Alcohol abuse, in remission: Secondary | ICD-10-CM

## 2020-02-16 LAB — CBC WITH DIFFERENTIAL/PLATELET
Basophils Absolute: 0 10*3/uL (ref 0.0–0.2)
Basos: 0 %
EOS (ABSOLUTE): 0.1 10*3/uL (ref 0.0–0.4)
Eos: 1 %
Hematocrit: 45.1 % (ref 37.5–51.0)
Hemoglobin: 15.1 g/dL (ref 13.0–17.7)
Immature Grans (Abs): 0 10*3/uL (ref 0.0–0.1)
Immature Granulocytes: 0 %
Lymphocytes Absolute: 0.6 10*3/uL — ABNORMAL LOW (ref 0.7–3.1)
Lymphs: 11 %
MCH: 29 pg (ref 26.6–33.0)
MCHC: 33.5 g/dL (ref 31.5–35.7)
MCV: 87 fL (ref 79–97)
Monocytes Absolute: 0.4 10*3/uL (ref 0.1–0.9)
Monocytes: 7 %
Neutrophils Absolute: 4.3 10*3/uL (ref 1.4–7.0)
Neutrophils: 81 %
Platelets: 147 10*3/uL — ABNORMAL LOW (ref 150–450)
RBC: 5.21 x10E6/uL (ref 4.14–5.80)
RDW: 13.2 % (ref 11.6–15.4)
WBC: 5.4 10*3/uL (ref 3.4–10.8)

## 2020-02-16 MED ORDER — ASMANEX (60 METERED DOSES) 220 MCG/INH IN AEPB: 2.0000 | INHALATION_SPRAY | Freq: Every day | RESPIRATORY_TRACT | 12 refills | Status: DC

## 2020-02-16 MED ORDER — TADALAFIL 20 MG PO TABS: 20.0000 mg | ORAL_TABLET | Freq: Every day | ORAL | 0 refills | Status: DC | PRN

## 2020-02-16 NOTE — Patient Instructions (Signed)
  Mr. Frederick Payne , Thank you for taking time to come for your Medicare Wellness Visit. I appreciate your ongoing commitment to your health goals. Please review the following plan we discussed and let me know if I can assist you in the future.   These are the goals we discussed: Use the Asmanex regularly and albuterol as needed but if you need to use the albuterol more than twice a week let me know This is a list of the screening recommended for you and due dates:  Health Maintenance  Topic Date Due  . Colon Cancer Screening  Never done  . Flu Shot  05/28/2020  . Tetanus Vaccine  01/22/2026  . COVID-19 Vaccine  Completed  .  Hepatitis C: One time screening is recommended by Center for Disease Control  (CDC) for  adults born from [birth year] through 1965.   Completed  . HIV Screening  Completed

## 2020-02-16 NOTE — Progress Notes (Signed)
Frederick Payne is a 59 y.o. male who presents for annual wellness visit and follow-up on chronic medical conditions.  He has a new cardiologist in [facility] that he is happy with.  He apparently now has a monitor.  He does have a history of atrial flutter as well as previous CABG and AVR.  He does have underlying RSD but presently is on no medications stating he would rather deal with the pain then deal with the medications.  There is a history of OSA but presently he is not on CPAP.  His allergies seem to be under good control with Claritin and Flonase.  He does use albuterol regularly.  He stopped using the Asmanex but does not give a good reason as to why he is doing that.  He does have hearing aids that he uses regularly.  Psychologically he is doing quite well and has cut way back on his alcohol consumption.  He does use St. Frederick Payne's wort regularly.  He continues on Coumadin and does get regular PT/INR done at home.  He does have a history of PE and DVT as well as factor V Leiden.   Immunizations and Health Maintenance Immunization History  Administered Date(s) Administered  . Influenza,inj,Quad PF,6+ Mos 06/24/2016, 07/16/2017, 07/14/2018, 06/30/2019  . Influenza-Unspecified 08/26/2014  . PFIZER SARS-COV-2 Vaccination 12/29/2019, 01/26/2020  . Pneumococcal Conjugate-13 08/29/2014  . Pneumococcal Polysaccharide-23 10/17/2009, 04/27/2013  . Tdap 06/06/2014, 01/23/2016  . Zoster 08/18/2013   Health Maintenance Due  Topic Date Due  . COLONOSCOPY  Never done    Last colonoscopy: N/A Last PSA: unkown Dentist: 2020 Ophtho: yearly Exercise: no  Other doctors caring for patient include: Dr. Elonda Payne cardio, Dr. Jannifer Payne neurology, [place] [facility]  Advanced Directives: not on file  Does Patient Have a Medical Advance Directive?: Yes Type of Advance Directive: Frederick Payne will Does patient want to make changes to medical advance directive?: No - Patient  declined Copy of [place] in Chart?: No - copy requested  Depression screen:  See questionnaire below.     Depression screen Frederick Payne 2/9 02/16/2020 08/26/2018 07/16/2017  Decreased Interest 0 0 0  Down, Depressed, Hopeless 0 0 0  PHQ - 2 Score 0 0 0    Fall Screen: See Questionaire below.   Fall Risk  02/16/2020 08/26/2018  Falls in the past year? 1 Yes  Number falls in past yr: 1 2 or more  Injury with Fall? 1 No  Risk for fall due to : - Other (Comment)  Risk for fall due to: Comment - has bad left leg   Follow up - Falls prevention discussed    ADL screen:  See questionnaire below.  Functional Status Survey: Is the patient deaf or have difficulty hearing?: Yes Does the patient have difficulty seeing, even when wearing glasses/contacts?: No Does the patient have difficulty concentrating, remembering, or making decisions?: No Does the patient have difficulty walking or climbing stairs?: Yes Does the patient have difficulty dressing or bathing?: No Does the patient have difficulty doing errands alone such as visiting a doctor's office or shopping?: No   Review of Systems  Constitutional: -, -unexpected weight change, -anorexia, -fatigue Allergy: -sneezing, -itching, -congestion Dermatology: denies changing moles, rash, lumps ENT: -runny nose, -ear pain, -sore throat,  Cardiology:  -chest pain, -palpitations, -orthopnea, Respiratory: -cough, -shortness of breath, -dyspnea on exertion, -wheezing,  Gastroenterology: -abdominal pain, -nausea, -vomiting, -diarrhea, -constipation, -dysphagia Hematology: -bleeding or bruising problems Musculoskeletal: -arthralgias, -myalgias, -joint swelling, -  back pain, - Ophthalmology: -vision changes,  Urology: -dysuria, -difficulty urinating,  -urinary frequency, -urgency, incontinence Neurology: -, -numbness, , -memory loss, -falls, -dizziness    PHYSICAL EXAM:  BP (!) 152/92   Pulse 95   Temp 99.1 F (37.3 C)   Ht 5'  7.5" (1.715 m)   Wt 216 lb 9.6 oz (98.2 kg)   SpO2 98%   BMI 33.42 kg/m   General Appearance: Alert, cooperative, no distress, appears stated age Head: Normocephalic, without obvious abnormality, atraumatic Eyes: PERRL, conjunctiva/corneas clear, EOM's intact, fundi benign Ears: Normal TM's and external ear canals Nose: Nares normal, mucosa normal, no drainage or sinus   tenderness Throat: Lips, mucosa, and tongue normal; teeth and gums normal Neck: Supple, no lymphadenopathy, thyroid:no enlargement/tenderness/nodules; no carotid bruit or JVD Lungs: Clear to auscultation bilaterally without wheezes, rales or ronchi; respirations unlabored Heart: Regular rate and rhythm, S1 and S2 normal, no murmur, rub or gallop Abdomen: Soft, non-tender, nondistended, normoactive bowel sounds, no masses, no hepatosplenomegaly Extremities: No clubbing, cyanosis or edema Pulses: 2+ and symmetric all extremities Skin: Skin color, texture, turgor normal, no rashes or lesions Lymph nodes: Cervical, supraclavicular, and axillary nodes normal Neurologic: CNII-XII intact, normal strength, sensation and gait; reflexes 2+ and symmetric throughout   Psych: Normal mood, affect, hygiene and grooming  ASSESSMENT/PLAN: Screening for colon cancer - Plan: Cologuard  Typical atrial flutter (HCC)  S/P CABG x 1 - Plan: CBC with Differential/Platelet  S/P AVR  Complex regional pain syndrome type 1 of left lower extremity - Plan: CBC with Differential/Platelet  Personal history of pulmonary embolism  OSA (obstructive sleep apnea)  Opioid dependence in remission (HCC)  History of alcohol abuse  Factor V deficiency (HCC)  Depression, major, in remission ([place])  Non-seasonal allergic rhinitis due to other allergic trigger  B12 deficiency  Asthma with allergic rhinitis without complication, unspecified asthma severity - Plan: mometasone (ASMANEX, 60 METERED DOSES,) 220 MCG/INH inhaler  Erectile dysfunction  due to arterial insufficiency - Plan: tadalafil (CIALIS) 20 MG tablet  Presbycusis of both ears The main issue today is that he does not have his asthma under good control.  He is to start back on Asmanex and use that regularly and get to the point that he will only need his albuterol twice per week during the day or twice per month at night.      Colonoscopy recommendations reviewed.   Medicare Attestation I have personally reviewed: The patient's medical and social history Their use of alcohol, tobacco or illicit drugs Their current medications and supplements The patient's functional ability including ADLs,fall risks, home safety risks, cognitive, and hearing and visual impairment Diet and physical activities Evidence for depression or mood disorders  The patient's weight, height, and BMI have been recorded in the chart.  I have made referrals, counseling, and provided education to the patient based on review of the above and I have provided the patient with a written personalized care plan for preventive services.     Jill Alexanders, MD   02/16/2020

## 2020-02-17 ENCOUNTER — Telehealth: Payer: Self-pay

## 2020-02-17 DIAGNOSIS — N5201 Erectile dysfunction due to arterial insufficiency: Secondary | ICD-10-CM

## 2020-02-17 DIAGNOSIS — J4520 Mild intermittent asthma, uncomplicated: Secondary | ICD-10-CM

## 2020-02-17 MED ORDER — ARNUITY ELLIPTA 100 MCG/ACT IN AEPB: 1.0000 | INHALATION_SPRAY | Freq: Two times a day (BID) | RESPIRATORY_TRACT | 11 refills | Status: DC

## 2020-02-17 MED ORDER — ARNUITY ELLIPTA 100 MCG/ACT IN AEPB: 1.0000 | INHALATION_SPRAY | Freq: Every day | RESPIRATORY_TRACT | 11 refills | Status: DC

## 2020-02-17 MED ORDER — TADALAFIL 20 MG PO TABS: 20.0000 mg | ORAL_TABLET | Freq: Every day | ORAL | 0 refills | Status: DC | PRN

## 2020-02-17 NOTE — Addendum Note (Signed)
Addended by: Denita Lung on: 02/17/2020 03:22 PM   Modules accepted: Orders

## 2020-02-17 NOTE — Telephone Encounter (Signed)
done

## 2020-02-17 NOTE — Telephone Encounter (Signed)
Please send in script of tadalafil to [facility] [id number]  [place]. And insurance will cover either flovent or ellipta. Please advise [facility]

## 2020-02-17 NOTE — Telephone Encounter (Signed)
Pt was called about two medicines that his insurance no cover tadalafill or his asmanex. Pt is aware of paying for tadalafil please send a script to [facility]

## 2020-02-17 NOTE — Addendum Note (Signed)
Addended by: Denita Lung on: 02/17/2020 03:18 PM   Modules accepted: Orders

## 2020-02-19 ENCOUNTER — Other Ambulatory Visit: Payer: Self-pay | Admitting: Family Medicine

## 2020-02-22 DIAGNOSIS — H15111 Episcleritis periodica fugax, right eye: Secondary | ICD-10-CM | POA: Diagnosis not present

## 2020-02-22 DIAGNOSIS — H16223 Keratoconjunctivitis sicca, not specified as Sjogren's, bilateral: Secondary | ICD-10-CM | POA: Diagnosis not present

## 2020-02-23 ENCOUNTER — Encounter: Payer: Self-pay | Admitting: Family Medicine

## 2020-02-29 DIAGNOSIS — H16223 Keratoconjunctivitis sicca, not specified as Sjogren's, bilateral: Secondary | ICD-10-CM | POA: Diagnosis not present

## 2020-02-29 DIAGNOSIS — Z9889 Other specified postprocedural states: Secondary | ICD-10-CM | POA: Diagnosis not present

## 2020-02-29 DIAGNOSIS — H15111 Episcleritis periodica fugax, right eye: Secondary | ICD-10-CM | POA: Diagnosis not present

## 2020-02-29 DIAGNOSIS — S0230XD Fracture of orbital floor, unspecified side, subsequent encounter for fracture with routine healing: Secondary | ICD-10-CM | POA: Diagnosis not present

## 2020-02-29 DIAGNOSIS — H2513 Age-related nuclear cataract, bilateral: Secondary | ICD-10-CM | POA: Diagnosis not present

## 2020-03-06 DIAGNOSIS — I4891 Unspecified atrial fibrillation: Secondary | ICD-10-CM | POA: Diagnosis not present

## 2020-03-06 DIAGNOSIS — I4892 Unspecified atrial flutter: Secondary | ICD-10-CM | POA: Diagnosis not present

## 2020-03-09 ENCOUNTER — Encounter: Payer: Self-pay | Admitting: Family Medicine

## 2020-03-13 ENCOUNTER — Other Ambulatory Visit: Payer: Self-pay | Admitting: Family Medicine

## 2020-03-13 DIAGNOSIS — Z7901 Long term (current) use of anticoagulants: Secondary | ICD-10-CM

## 2020-03-13 DIAGNOSIS — D6851 Activated protein C resistance: Secondary | ICD-10-CM

## 2020-03-23 DIAGNOSIS — M47816 Spondylosis without myelopathy or radiculopathy, lumbar region: Secondary | ICD-10-CM | POA: Diagnosis not present

## 2020-03-23 DIAGNOSIS — M545 Low back pain: Secondary | ICD-10-CM | POA: Diagnosis not present

## 2020-03-23 DIAGNOSIS — G90522 Complex regional pain syndrome I of left lower limb: Secondary | ICD-10-CM | POA: Diagnosis not present

## 2020-03-23 DIAGNOSIS — G894 Chronic pain syndrome: Secondary | ICD-10-CM | POA: Diagnosis not present

## 2020-03-31 ENCOUNTER — Ambulatory Visit: Payer: Medicare Other | Admitting: Cardiology

## 2020-04-06 DIAGNOSIS — M47816 Spondylosis without myelopathy or radiculopathy, lumbar region: Secondary | ICD-10-CM | POA: Diagnosis not present

## 2020-04-06 DIAGNOSIS — M545 Low back pain: Secondary | ICD-10-CM | POA: Diagnosis not present

## 2020-04-06 DIAGNOSIS — G90522 Complex regional pain syndrome I of left lower limb: Secondary | ICD-10-CM | POA: Diagnosis not present

## 2020-04-06 DIAGNOSIS — G894 Chronic pain syndrome: Secondary | ICD-10-CM | POA: Diagnosis not present

## 2020-04-21 ENCOUNTER — Encounter: Payer: Self-pay | Admitting: Family Medicine

## 2020-05-10 DIAGNOSIS — Z954 Presence of other heart-valve replacement: Secondary | ICD-10-CM | POA: Diagnosis not present

## 2020-05-10 DIAGNOSIS — I4892 Unspecified atrial flutter: Secondary | ICD-10-CM | POA: Diagnosis not present

## 2020-05-10 DIAGNOSIS — Z951 Presence of aortocoronary bypass graft: Secondary | ICD-10-CM | POA: Diagnosis not present

## 2020-05-10 DIAGNOSIS — R55 Syncope and collapse: Secondary | ICD-10-CM | POA: Diagnosis not present

## 2020-05-10 DIAGNOSIS — D682 Hereditary deficiency of other clotting factors: Secondary | ICD-10-CM | POA: Diagnosis not present

## 2020-05-10 DIAGNOSIS — I4891 Unspecified atrial fibrillation: Secondary | ICD-10-CM | POA: Diagnosis not present

## 2020-05-10 DIAGNOSIS — Z9889 Other specified postprocedural states: Secondary | ICD-10-CM | POA: Diagnosis not present

## 2020-05-10 DIAGNOSIS — I483 Typical atrial flutter: Secondary | ICD-10-CM | POA: Diagnosis not present

## 2020-05-10 DIAGNOSIS — I2510 Atherosclerotic heart disease of native coronary artery without angina pectoris: Secondary | ICD-10-CM | POA: Diagnosis not present

## 2020-05-14 DIAGNOSIS — I483 Typical atrial flutter: Secondary | ICD-10-CM | POA: Diagnosis not present

## 2020-05-23 DIAGNOSIS — M47816 Spondylosis without myelopathy or radiculopathy, lumbar region: Secondary | ICD-10-CM | POA: Diagnosis not present

## 2020-05-25 ENCOUNTER — Encounter: Payer: Self-pay | Admitting: Family Medicine

## 2020-05-25 DIAGNOSIS — M47812 Spondylosis without myelopathy or radiculopathy, cervical region: Secondary | ICD-10-CM | POA: Insufficient documentation

## 2020-05-26 ENCOUNTER — Other Ambulatory Visit: Payer: Self-pay | Admitting: Family Medicine

## 2020-05-26 DIAGNOSIS — D6851 Activated protein C resistance: Secondary | ICD-10-CM

## 2020-05-26 DIAGNOSIS — Z7901 Long term (current) use of anticoagulants: Secondary | ICD-10-CM

## 2020-06-01 ENCOUNTER — Ambulatory Visit (INDEPENDENT_AMBULATORY_CARE_PROVIDER_SITE_OTHER): Payer: Medicare Other | Admitting: Neurology

## 2020-06-01 ENCOUNTER — Other Ambulatory Visit: Payer: Self-pay

## 2020-06-01 ENCOUNTER — Encounter: Payer: Self-pay | Admitting: Neurology

## 2020-06-01 VITALS — BP 133/85 | HR 78 | Ht 68.0 in | Wt 216.0 lb

## 2020-06-01 DIAGNOSIS — I2510 Atherosclerotic heart disease of native coronary artery without angina pectoris: Secondary | ICD-10-CM

## 2020-06-01 DIAGNOSIS — G90522 Complex regional pain syndrome I of left lower limb: Secondary | ICD-10-CM

## 2020-06-01 NOTE — Progress Notes (Signed)
I have read the note, and I agree with the clinical assessment and plan.  Roslyn Else K Sheala Dosh   

## 2020-06-01 NOTE — Patient Instructions (Signed)
Continue with the pain center  Check with your primary doctor about bridging the coumadin  See you back in 6 months

## 2020-06-01 NOTE — Progress Notes (Signed)
PATIENT: Frederick Payne DOB: [birth year]  REASON FOR VISIT: follow up HISTORY FROM: patient  HISTORY OF PRESENT ILLNESS: Today 06/01/20  Frederick Payne is a 59 year old male with history of left leg pain that is felt to be neuropathic (describes pain to his left mid calf, down into his foot like someone is stepping on him).  He is now at the [facility] pain center.  He was in a study to have an implant placed for his left leg pain, was assigned to the control group, didn't get the implant.  He will be having a medial nerve block to help his neck and low back pain on August 10.  He is on Coumadin, has been told to stop Coumadin for 5 days.  He is followed by cardiology, has a loop recorder, stopped metoprolol, since then no syncopal episodes.  In 2018, MRI of the brain showed a small pontine infarct.  Carotid Doppler study has been unremarkable.  He has history of factor V, blood clot in his left leg in 2007, and artificial heart valve with IVC filter.  He presents today for evaluation unaccompanied.  HISTORY 12/01/2019 Dr. Jannifer Franklin: Frederick Payne is a 59 year old right-handed white male with a history of left leg pain that is felt to be neuropathic in nature.  He was sent to Dr. Hardin Negus for a pain evaluation, Dr. Hardin Negus did not want to treat him because he was on Coumadin.  The patient has an artificial valve and has a IVC filter in place.  The patient has continued to have episodes of syncope, he is having one every 2 to 3 weeks, he has been noted to have events of bradycardia with heart rates going down into the upper 30s.  He is on metoprolol, he is followed by Dr. Martinique from cardiology.  He will be undergoing a heart monitor study off of metoprolol in the near future.  The patient has what sounds like vasovagal syncope with a sensation of dizziness, reduced hearing, nausea, diaphoresis, and then syncope.  He is able to prevent a blackout by lying down.  In 2018, he had an MRI of the brain that showed a  small pontine infarct.  The patient returns to the office today for an evaluation.   REVIEW OF SYSTEMS: Out of a complete 14 system review of symptoms, the patient complains only of the following symptoms, and all other reviewed systems are negative.  Chronic pain  ALLERGIES: Allergies  Allergen Reactions  . Benzodiazepines Other (See Comments)    Causes pt to get violent   . Contrast Media [Iodinated Diagnostic Agents] Hives  . Iodine Anaphylaxis  . Other Anaphylaxis    FRUITS FROM TREES.   . Pectin Anaphylaxis  . Shellfish Allergy Anaphylaxis  . Sulfa Antibiotics Anaphylaxis  . Diazepam Other (See Comments)    Headache, strange behavior, memory loss  . Lactose Intolerance (Gi) Nausea And Vomiting    HOME MEDICATIONS: Outpatient Medications Prior to Visit  Medication Sig Dispense Refill  . albuterol (VENTOLIN HFA) 108 (90 Base) MCG/ACT inhaler INHALE 2 PUFFS BY MOUTH EVERY 4 TO 6 HOURS AS NEEDED 18 g 0  . ascorbic acid (VITAMIN C) 500 MG tablet Take 500 mg by mouth daily.    [name] aspirin 81 MG tablet Take 81 mg by mouth daily.    Sabino Dick LANCETS MISC 1 each by Does not apply route as directed. 200 each 1  . DHA-PHOSPHATIDYLSERINE PO Take 500 mg by mouth.    . FEROSUL 325 (  PATIENT: Frederick Payne DOB: [birth year]  REASON FOR VISIT: follow up HISTORY FROM: patient  HISTORY OF PRESENT ILLNESS: Today 06/01/20  Frederick Payne is a 59 year old male with history of left leg pain that is felt to be neuropathic (describes pain to his left mid calf, down into his foot like someone is stepping on him).  He is now at the [facility] pain center.  He was in a study to have an implant placed for his left leg pain, was assigned to the control group, didn't get the implant.  He will be having a medial nerve block to help his neck and low back pain on August 10.  He is on Coumadin, has been told to stop Coumadin for 5 days.  He is followed by cardiology, has a loop recorder, stopped metoprolol, since then no syncopal episodes.  In 2018, MRI of the brain showed a small pontine infarct.  Carotid Doppler study has been unremarkable.  He has history of factor V, blood clot in his left leg in 2007, and artificial heart valve with IVC filter.  He presents today for evaluation unaccompanied.  HISTORY 12/01/2019 Dr. Jannifer Franklin: Frederick Payne is a 59 year old right-handed white male with a history of left leg pain that is felt to be neuropathic in nature.  He was sent to Dr. Hardin Negus for a pain evaluation, Dr. Hardin Negus did not want to treat him because he was on Coumadin.  The patient has an artificial valve and has a IVC filter in place.  The patient has continued to have episodes of syncope, he is having one every 2 to 3 weeks, he has been noted to have events of bradycardia with heart rates going down into the upper 30s.  He is on metoprolol, he is followed by Dr. Martinique from cardiology.  He will be undergoing a heart monitor study off of metoprolol in the near future.  The patient has what sounds like vasovagal syncope with a sensation of dizziness, reduced hearing, nausea, diaphoresis, and then syncope.  He is able to prevent a blackout by lying down.  In 2018, he had an MRI of the brain that showed a  small pontine infarct.  The patient returns to the office today for an evaluation.   REVIEW OF SYSTEMS: Out of a complete 14 system review of symptoms, the patient complains only of the following symptoms, and all other reviewed systems are negative.  Chronic pain  ALLERGIES: Allergies  Allergen Reactions  . Benzodiazepines Other (See Comments)    Causes pt to get violent   . Contrast Media [Iodinated Diagnostic Agents] Hives  . Iodine Anaphylaxis  . Other Anaphylaxis    FRUITS FROM TREES.   . Pectin Anaphylaxis  . Shellfish Allergy Anaphylaxis  . Sulfa Antibiotics Anaphylaxis  . Diazepam Other (See Comments)    Headache, strange behavior, memory loss  . Lactose Intolerance (Gi) Nausea And Vomiting    HOME MEDICATIONS: Outpatient Medications Prior to Visit  Medication Sig Dispense Refill  . albuterol (VENTOLIN HFA) 108 (90 Base) MCG/ACT inhaler INHALE 2 PUFFS BY MOUTH EVERY 4 TO 6 HOURS AS NEEDED 18 g 0  . ascorbic acid (VITAMIN C) 500 MG tablet Take 500 mg by mouth daily.    [name] aspirin 81 MG tablet Take 81 mg by mouth daily.    Sabino Dick LANCETS MISC 1 each by Does not apply route as directed. 200 each 1  . DHA-PHOSPHATIDYLSERINE PO Take 500 mg by mouth.    . FEROSUL 325 (  PATIENT: Frederick Payne DOB: [birth year]  REASON FOR VISIT: follow up HISTORY FROM: patient  HISTORY OF PRESENT ILLNESS: Today 06/01/20  Frederick Payne is a 59 year old male with history of left leg pain that is felt to be neuropathic (describes pain to his left mid calf, down into his foot like someone is stepping on him).  He is now at the [facility] pain center.  He was in a study to have an implant placed for his left leg pain, was assigned to the control group, didn't get the implant.  He will be having a medial nerve block to help his neck and low back pain on August 10.  He is on Coumadin, has been told to stop Coumadin for 5 days.  He is followed by cardiology, has a loop recorder, stopped metoprolol, since then no syncopal episodes.  In 2018, MRI of the brain showed a small pontine infarct.  Carotid Doppler study has been unremarkable.  He has history of factor V, blood clot in his left leg in 2007, and artificial heart valve with IVC filter.  He presents today for evaluation unaccompanied.  HISTORY 12/01/2019 Dr. Jannifer Franklin: Frederick Payne is a 59 year old right-handed white male with a history of left leg pain that is felt to be neuropathic in nature.  He was sent to Dr. Hardin Negus for a pain evaluation, Dr. Hardin Negus did not want to treat him because he was on Coumadin.  The patient has an artificial valve and has a IVC filter in place.  The patient has continued to have episodes of syncope, he is having one every 2 to 3 weeks, he has been noted to have events of bradycardia with heart rates going down into the upper 30s.  He is on metoprolol, he is followed by Dr. Martinique from cardiology.  He will be undergoing a heart monitor study off of metoprolol in the near future.  The patient has what sounds like vasovagal syncope with a sensation of dizziness, reduced hearing, nausea, diaphoresis, and then syncope.  He is able to prevent a blackout by lying down.  In 2018, he had an MRI of the brain that showed a  small pontine infarct.  The patient returns to the office today for an evaluation.   REVIEW OF SYSTEMS: Out of a complete 14 system review of symptoms, the patient complains only of the following symptoms, and all other reviewed systems are negative.  Chronic pain  ALLERGIES: Allergies  Allergen Reactions  . Benzodiazepines Other (See Comments)    Causes pt to get violent   . Contrast Media [Iodinated Diagnostic Agents] Hives  . Iodine Anaphylaxis  . Other Anaphylaxis    FRUITS FROM TREES.   . Pectin Anaphylaxis  . Shellfish Allergy Anaphylaxis  . Sulfa Antibiotics Anaphylaxis  . Diazepam Other (See Comments)    Headache, strange behavior, memory loss  . Lactose Intolerance (Gi) Nausea And Vomiting    HOME MEDICATIONS: Outpatient Medications Prior to Visit  Medication Sig Dispense Refill  . albuterol (VENTOLIN HFA) 108 (90 Base) MCG/ACT inhaler INHALE 2 PUFFS BY MOUTH EVERY 4 TO 6 HOURS AS NEEDED 18 g 0  . ascorbic acid (VITAMIN C) 500 MG tablet Take 500 mg by mouth daily.    [name] aspirin 81 MG tablet Take 81 mg by mouth daily.    Sabino Dick LANCETS MISC 1 each by Does not apply route as directed. 200 each 1  . DHA-PHOSPHATIDYLSERINE PO Take 500 mg by mouth.    . FEROSUL 325 (  PATIENT: Kyser Payne DOB: 1961-08-26  REASON FOR VISIT: follow up HISTORY FROM: patient  HISTORY OF PRESENT ILLNESS: Today 06/01/20  Frederick Payne is a 59 year old male with history of left leg pain that is felt to be neuropathic (describes pain to his left mid calf, down into his foot like someone is stepping on him).  He is now at the [facility] pain center.  He was in a study to have an implant placed for his left leg pain, was assigned to the control group, didn't get the implant.  He will be having a medial nerve block to help his neck and low back pain on August 10.  He is on Coumadin, has been told to stop Coumadin for 5 days.  He is followed by cardiology, has a loop recorder, stopped metoprolol, since then no syncopal episodes.  In 2018, MRI of the brain showed a small pontine infarct.  Carotid Doppler study has been unremarkable.  He has history of factor V, blood clot in his left leg in 2007, and artificial heart valve with IVC filter.  He presents today for evaluation unaccompanied.  HISTORY 12/01/2019 Dr. Jannifer Franklin: Frederick Payne is a 59 year old right-handed white male with a history of left leg pain that is felt to be neuropathic in nature.  He was sent to Dr. Hardin Negus for a pain evaluation, Dr. Hardin Negus did not want to treat him because he was on Coumadin.  The patient has an artificial valve and has a IVC filter in place.  The patient has continued to have episodes of syncope, he is having one every 2 to 3 weeks, he has been noted to have events of bradycardia with heart rates going down into the upper 30s.  He is on metoprolol, he is followed by Dr. Martinique from cardiology.  He will be undergoing a heart monitor study off of metoprolol in the near future.  The patient has what sounds like vasovagal syncope with a sensation of dizziness, reduced hearing, nausea, diaphoresis, and then syncope.  He is able to prevent a blackout by lying down.  In 2018, he had an MRI of the brain that showed a  small pontine infarct.  The patient returns to the office today for an evaluation.   REVIEW OF SYSTEMS: Out of a complete 14 system review of symptoms, the patient complains only of the following symptoms, and all other reviewed systems are negative.  Chronic pain  ALLERGIES: Allergies  Allergen Reactions  . Benzodiazepines Other (See Comments)    Causes pt to get violent   . Contrast Media [Iodinated Diagnostic Agents] Hives  . Iodine Anaphylaxis  . Other Anaphylaxis    FRUITS FROM TREES.   . Pectin Anaphylaxis  . Shellfish Allergy Anaphylaxis  . Sulfa Antibiotics Anaphylaxis  . Diazepam Other (See Comments)    Headache, strange behavior, memory loss  . Lactose Intolerance (Gi) Nausea And Vomiting    HOME MEDICATIONS: Outpatient Medications Prior to Visit  Medication Sig Dispense Refill  . albuterol (VENTOLIN HFA) 108 (90 Base) MCG/ACT inhaler INHALE 2 PUFFS BY MOUTH EVERY 4 TO 6 HOURS AS NEEDED 18 g 0  . ascorbic acid (VITAMIN C) 500 MG tablet Take 500 mg by mouth daily.    [name] aspirin 81 MG tablet Take 81 mg by mouth daily.    Sabino Dick LANCETS MISC 1 each by Does not apply route as directed. 200 each 1  . DHA-PHOSPHATIDYLSERINE PO Take 500 mg by mouth.    . FEROSUL 325 (

## 2020-06-06 DIAGNOSIS — Z903 Acquired absence of stomach [part of]: Secondary | ICD-10-CM | POA: Diagnosis not present

## 2020-06-06 DIAGNOSIS — M1990 Unspecified osteoarthritis, unspecified site: Secondary | ICD-10-CM | POA: Diagnosis not present

## 2020-06-06 DIAGNOSIS — Z8379 Family history of other diseases of the digestive system: Secondary | ICD-10-CM | POA: Diagnosis not present

## 2020-06-06 DIAGNOSIS — Z872 Personal history of diseases of the skin and subcutaneous tissue: Secondary | ICD-10-CM | POA: Diagnosis not present

## 2020-06-06 DIAGNOSIS — D509 Iron deficiency anemia, unspecified: Secondary | ICD-10-CM | POA: Diagnosis not present

## 2020-06-06 DIAGNOSIS — Z809 Family history of malignant neoplasm, unspecified: Secondary | ICD-10-CM | POA: Diagnosis not present

## 2020-06-06 DIAGNOSIS — Z87891 Personal history of nicotine dependence: Secondary | ICD-10-CM | POA: Diagnosis not present

## 2020-06-06 DIAGNOSIS — Z86010 Personal history of colonic polyps: Secondary | ICD-10-CM | POA: Diagnosis not present

## 2020-06-06 DIAGNOSIS — Z9852 Vasectomy status: Secondary | ICD-10-CM | POA: Diagnosis not present

## 2020-06-06 DIAGNOSIS — Z8719 Personal history of other diseases of the digestive system: Secondary | ICD-10-CM | POA: Diagnosis not present

## 2020-06-06 DIAGNOSIS — D6851 Activated protein C resistance: Secondary | ICD-10-CM | POA: Diagnosis not present

## 2020-06-06 DIAGNOSIS — E079 Disorder of thyroid, unspecified: Secondary | ICD-10-CM | POA: Diagnosis not present

## 2020-06-06 DIAGNOSIS — Z811 Family history of alcohol abuse and dependence: Secondary | ICD-10-CM | POA: Diagnosis not present

## 2020-06-06 DIAGNOSIS — Z951 Presence of aortocoronary bypass graft: Secondary | ICD-10-CM | POA: Diagnosis not present

## 2020-06-06 DIAGNOSIS — Z95828 Presence of other vascular implants and grafts: Secondary | ICD-10-CM | POA: Diagnosis not present

## 2020-06-06 DIAGNOSIS — Z86711 Personal history of pulmonary embolism: Secondary | ICD-10-CM | POA: Diagnosis not present

## 2020-06-06 DIAGNOSIS — Z91041 Radiographic dye allergy status: Secondary | ICD-10-CM | POA: Diagnosis not present

## 2020-06-06 DIAGNOSIS — M47816 Spondylosis without myelopathy or radiculopathy, lumbar region: Secondary | ICD-10-CM | POA: Diagnosis not present

## 2020-06-06 DIAGNOSIS — Z86718 Personal history of other venous thrombosis and embolism: Secondary | ICD-10-CM | POA: Diagnosis not present

## 2020-06-06 DIAGNOSIS — Z9049 Acquired absence of other specified parts of digestive tract: Secondary | ICD-10-CM | POA: Diagnosis not present

## 2020-06-06 DIAGNOSIS — I2510 Atherosclerotic heart disease of native coronary artery without angina pectoris: Secondary | ICD-10-CM | POA: Diagnosis not present

## 2020-06-06 DIAGNOSIS — Z832 Family history of diseases of the blood and blood-forming organs and certain disorders involving the immune mechanism: Secondary | ICD-10-CM | POA: Diagnosis not present

## 2020-06-06 DIAGNOSIS — J45909 Unspecified asthma, uncomplicated: Secondary | ICD-10-CM | POA: Diagnosis not present

## 2020-06-06 DIAGNOSIS — Z818 Family history of other mental and behavioral disorders: Secondary | ICD-10-CM | POA: Diagnosis not present

## 2020-06-14 ENCOUNTER — Ambulatory Visit: Payer: Medicare Other | Admitting: Neurology

## 2020-06-19 DIAGNOSIS — I4892 Unspecified atrial flutter: Secondary | ICD-10-CM | POA: Diagnosis not present

## 2020-06-19 DIAGNOSIS — I4891 Unspecified atrial fibrillation: Secondary | ICD-10-CM | POA: Diagnosis not present

## 2020-06-20 DIAGNOSIS — D509 Iron deficiency anemia, unspecified: Secondary | ICD-10-CM | POA: Diagnosis not present

## 2020-06-20 DIAGNOSIS — Z9852 Vasectomy status: Secondary | ICD-10-CM | POA: Diagnosis not present

## 2020-06-20 DIAGNOSIS — Z882 Allergy status to sulfonamides status: Secondary | ICD-10-CM | POA: Diagnosis not present

## 2020-06-20 DIAGNOSIS — E079 Disorder of thyroid, unspecified: Secondary | ICD-10-CM | POA: Diagnosis not present

## 2020-06-20 DIAGNOSIS — Z87891 Personal history of nicotine dependence: Secondary | ICD-10-CM | POA: Diagnosis not present

## 2020-06-20 DIAGNOSIS — Z91013 Allergy to seafood: Secondary | ICD-10-CM | POA: Diagnosis not present

## 2020-06-20 DIAGNOSIS — Z95828 Presence of other vascular implants and grafts: Secondary | ICD-10-CM | POA: Diagnosis not present

## 2020-06-20 DIAGNOSIS — Z86711 Personal history of pulmonary embolism: Secondary | ICD-10-CM | POA: Diagnosis not present

## 2020-06-20 DIAGNOSIS — M47816 Spondylosis without myelopathy or radiculopathy, lumbar region: Secondary | ICD-10-CM | POA: Diagnosis not present

## 2020-06-20 DIAGNOSIS — Z8679 Personal history of other diseases of the circulatory system: Secondary | ICD-10-CM | POA: Diagnosis not present

## 2020-06-20 DIAGNOSIS — Z9049 Acquired absence of other specified parts of digestive tract: Secondary | ICD-10-CM | POA: Diagnosis not present

## 2020-06-20 DIAGNOSIS — J45909 Unspecified asthma, uncomplicated: Secondary | ICD-10-CM | POA: Diagnosis not present

## 2020-06-20 DIAGNOSIS — D6851 Activated protein C resistance: Secondary | ICD-10-CM | POA: Diagnosis not present

## 2020-06-20 DIAGNOSIS — Z885 Allergy status to narcotic agent status: Secondary | ICD-10-CM | POA: Diagnosis not present

## 2020-06-20 DIAGNOSIS — Z91041 Radiographic dye allergy status: Secondary | ICD-10-CM | POA: Diagnosis not present

## 2020-06-20 DIAGNOSIS — Z952 Presence of prosthetic heart valve: Secondary | ICD-10-CM | POA: Diagnosis not present

## 2020-06-20 DIAGNOSIS — M1990 Unspecified osteoarthritis, unspecified site: Secondary | ICD-10-CM | POA: Diagnosis not present

## 2020-06-20 DIAGNOSIS — Z951 Presence of aortocoronary bypass graft: Secondary | ICD-10-CM | POA: Diagnosis not present

## 2020-06-20 DIAGNOSIS — Z86010 Personal history of colonic polyps: Secondary | ICD-10-CM | POA: Diagnosis not present

## 2020-06-20 DIAGNOSIS — Z903 Acquired absence of stomach [part of]: Secondary | ICD-10-CM | POA: Diagnosis not present

## 2020-06-20 DIAGNOSIS — Z86718 Personal history of other venous thrombosis and embolism: Secondary | ICD-10-CM | POA: Diagnosis not present

## 2020-06-20 DIAGNOSIS — Z8719 Personal history of other diseases of the digestive system: Secondary | ICD-10-CM | POA: Diagnosis not present

## 2020-06-20 DIAGNOSIS — Z8711 Personal history of peptic ulcer disease: Secondary | ICD-10-CM | POA: Diagnosis not present

## 2020-06-20 DIAGNOSIS — I2510 Atherosclerotic heart disease of native coronary artery without angina pectoris: Secondary | ICD-10-CM | POA: Diagnosis not present

## 2020-07-05 ENCOUNTER — Ambulatory Visit
Admission: RE | Admit: 2020-07-05 | Discharge: 2020-07-05 | Disposition: A | Discharge status: Home / Self Care | Payer: Medicare Other | Source: Ambulatory Visit | Attending: Family Medicine | Admitting: Family Medicine

## 2020-07-05 ENCOUNTER — Ambulatory Visit (INDEPENDENT_AMBULATORY_CARE_PROVIDER_SITE_OTHER): Payer: Medicare Other | Admitting: Family Medicine

## 2020-07-05 ENCOUNTER — Encounter: Payer: Self-pay | Admitting: Family Medicine

## 2020-07-05 ENCOUNTER — Other Ambulatory Visit: Payer: Self-pay

## 2020-07-05 VITALS — BP 142/90 | HR 89 | Temp 98.6°F | Wt 216.4 lb

## 2020-07-05 DIAGNOSIS — I2510 Atherosclerotic heart disease of native coronary artery without angina pectoris: Secondary | ICD-10-CM

## 2020-07-05 DIAGNOSIS — M1612 Unilateral primary osteoarthritis, left hip: Secondary | ICD-10-CM | POA: Diagnosis not present

## 2020-07-05 DIAGNOSIS — M25552 Pain in left hip: Secondary | ICD-10-CM

## 2020-07-05 NOTE — Progress Notes (Signed)
Subjective:    Patient ID: Frederick Payne, male    DOB: 1960-12-30, 59 y.o.   MRN: [id number]  HPI He complains of a 2-year history of left hip pain that has been slowly getting worse.  He notes difficulty getting in and out of cars and it is limiting his other ADLs as well specifically going up and down steps.  No other joints are involved.   Review of Systems     Objective:   Physical Exam External rotation is normal, internal rotation causes some discomfort.  Pain on flexion of the hip.  No tenderness over the greater trochanter.  SI joints are nontender.  No pain over sciatic notch       Assessment & Plan:  Left hip pain - Plan: DG Hip Unilat W OR W/O Pelvis 2-3 Views Left Discussed the use of Tylenol to help with his hip pain and the possible need for steroid injections if continued difficulty.  Also discussed the possibility of hip replacement.

## 2020-07-09 ENCOUNTER — Other Ambulatory Visit: Payer: Self-pay | Admitting: Family Medicine

## 2020-07-10 NOTE — Telephone Encounter (Signed)
[facility] is requesting to fill pt albuterol inhaler. Please advise [facility]

## 2020-07-11 ENCOUNTER — Other Ambulatory Visit: Payer: Self-pay | Admitting: Family Medicine

## 2020-07-11 DIAGNOSIS — J4520 Mild intermittent asthma, uncomplicated: Secondary | ICD-10-CM

## 2020-07-11 NOTE — Telephone Encounter (Signed)
[facility] is requesting to fill pt arnuity inhaler. Please advise [facility]

## 2020-07-12 DIAGNOSIS — D485 Neoplasm of uncertain behavior of skin: Secondary | ICD-10-CM | POA: Diagnosis not present

## 2020-07-12 DIAGNOSIS — L821 Other seborrheic keratosis: Secondary | ICD-10-CM | POA: Diagnosis not present

## 2020-07-12 DIAGNOSIS — L820 Inflamed seborrheic keratosis: Secondary | ICD-10-CM | POA: Diagnosis not present

## 2020-07-12 DIAGNOSIS — D0422 Carcinoma in situ of skin of left ear and external auricular canal: Secondary | ICD-10-CM | POA: Diagnosis not present

## 2020-07-12 DIAGNOSIS — L853 Xerosis cutis: Secondary | ICD-10-CM | POA: Diagnosis not present

## 2020-07-12 DIAGNOSIS — Z86008 Personal history of in-situ neoplasm of other site: Secondary | ICD-10-CM | POA: Diagnosis not present

## 2020-07-31 DIAGNOSIS — L820 Inflamed seborrheic keratosis: Secondary | ICD-10-CM | POA: Diagnosis not present

## 2020-07-31 DIAGNOSIS — D0422 Carcinoma in situ of skin of left ear and external auricular canal: Secondary | ICD-10-CM | POA: Diagnosis not present

## 2020-08-17 DIAGNOSIS — Z23 Encounter for immunization: Secondary | ICD-10-CM | POA: Diagnosis not present

## 2020-08-21 ENCOUNTER — Other Ambulatory Visit: Payer: Self-pay | Admitting: Family Medicine

## 2020-08-23 DIAGNOSIS — Z23 Encounter for immunization: Secondary | ICD-10-CM | POA: Diagnosis not present

## 2020-08-27 DIAGNOSIS — I483 Typical atrial flutter: Secondary | ICD-10-CM | POA: Diagnosis not present

## 2020-09-15 DIAGNOSIS — G894 Chronic pain syndrome: Secondary | ICD-10-CM | POA: Diagnosis not present

## 2020-09-15 DIAGNOSIS — M549 Dorsalgia, unspecified: Secondary | ICD-10-CM | POA: Diagnosis not present

## 2020-09-15 DIAGNOSIS — M47816 Spondylosis without myelopathy or radiculopathy, lumbar region: Secondary | ICD-10-CM | POA: Diagnosis not present

## 2020-09-15 DIAGNOSIS — M545 Low back pain, unspecified: Secondary | ICD-10-CM | POA: Diagnosis not present

## 2020-09-22 DIAGNOSIS — M2578 Osteophyte, vertebrae: Secondary | ICD-10-CM | POA: Diagnosis not present

## 2020-09-22 DIAGNOSIS — M47812 Spondylosis without myelopathy or radiculopathy, cervical region: Secondary | ICD-10-CM | POA: Diagnosis not present

## 2020-09-22 DIAGNOSIS — M542 Cervicalgia: Secondary | ICD-10-CM | POA: Diagnosis not present

## 2020-09-22 DIAGNOSIS — G894 Chronic pain syndrome: Secondary | ICD-10-CM | POA: Diagnosis not present

## 2020-09-22 DIAGNOSIS — M5031 Other cervical disc degeneration,  high cervical region: Secondary | ICD-10-CM | POA: Diagnosis not present

## 2020-10-02 DIAGNOSIS — I483 Typical atrial flutter: Secondary | ICD-10-CM | POA: Diagnosis not present

## 2020-10-18 DIAGNOSIS — M47812 Spondylosis without myelopathy or radiculopathy, cervical region: Secondary | ICD-10-CM | POA: Diagnosis not present

## 2020-11-05 DIAGNOSIS — I4892 Unspecified atrial flutter: Secondary | ICD-10-CM | POA: Diagnosis not present

## 2020-11-10 DIAGNOSIS — G894 Chronic pain syndrome: Secondary | ICD-10-CM | POA: Diagnosis not present

## 2020-11-10 DIAGNOSIS — M25561 Pain in right knee: Secondary | ICD-10-CM | POA: Diagnosis not present

## 2020-11-10 DIAGNOSIS — M25562 Pain in left knee: Secondary | ICD-10-CM | POA: Diagnosis not present

## 2020-11-10 DIAGNOSIS — M47816 Spondylosis without myelopathy or radiculopathy, lumbar region: Secondary | ICD-10-CM | POA: Diagnosis not present

## 2020-11-10 DIAGNOSIS — G8929 Other chronic pain: Secondary | ICD-10-CM | POA: Diagnosis not present

## 2020-11-10 DIAGNOSIS — M47812 Spondylosis without myelopathy or radiculopathy, cervical region: Secondary | ICD-10-CM | POA: Diagnosis not present

## 2020-11-10 DIAGNOSIS — M7062 Trochanteric bursitis, left hip: Secondary | ICD-10-CM | POA: Diagnosis not present

## 2020-11-10 DIAGNOSIS — M542 Cervicalgia: Secondary | ICD-10-CM | POA: Diagnosis not present

## 2020-11-10 DIAGNOSIS — M545 Low back pain, unspecified: Secondary | ICD-10-CM | POA: Diagnosis not present

## 2020-11-10 DIAGNOSIS — G90522 Complex regional pain syndrome I of left lower limb: Secondary | ICD-10-CM | POA: Diagnosis not present

## 2020-11-10 DIAGNOSIS — M549 Dorsalgia, unspecified: Secondary | ICD-10-CM | POA: Diagnosis not present

## 2020-11-16 ENCOUNTER — Encounter: Payer: Self-pay | Admitting: Family Medicine

## 2020-11-20 ENCOUNTER — Other Ambulatory Visit: Payer: Self-pay

## 2020-11-20 ENCOUNTER — Encounter: Payer: Self-pay | Admitting: Family Medicine

## 2020-11-20 ENCOUNTER — Ambulatory Visit (INDEPENDENT_AMBULATORY_CARE_PROVIDER_SITE_OTHER): Payer: Medicare Other | Admitting: Family Medicine

## 2020-11-20 VITALS — BP 122/80 | HR 86 | Temp 98.2°F | Wt 215.4 lb

## 2020-11-20 DIAGNOSIS — G90522 Complex regional pain syndrome I of left lower limb: Secondary | ICD-10-CM

## 2020-11-20 DIAGNOSIS — M545 Low back pain, unspecified: Secondary | ICD-10-CM

## 2020-11-20 DIAGNOSIS — G8929 Other chronic pain: Secondary | ICD-10-CM | POA: Diagnosis not present

## 2020-11-20 NOTE — Progress Notes (Signed)
Subjective:    Patient ID: Frederick Payne, male    DOB: 1961/03/16, 60 y.o.   MRN: [id number]  HPI He is here for consult concerning continued difficulty with hip pain.  He now explains that the pain is actually both hips, left greater than right as well as his low back area.  He has been going to pain management and getting injections into his back as well as his cervical spine.  He does have a history of chronic pain syndrome.  He has had an MRI.   Review of Systems     Objective:   Physical Exam Alert and complaining of pain in the lateral gluteal area near the gluteus medius and minimus.  No tenderness over SI joint.  No tenderness over greater trochanter.  He does complain of tenderness over the entire low back area.  Straight leg raising was negative.  Ankle reflex was questionable.  Knee reflexes 1+.       Assessment & Plan:  Complex regional pain syndrome type 1 of left lower extremity  Chronic bilateral low back pain without sciatica Difficult to assess.  I will discuss this with Lamar Benes who he is seeing for his back pain.

## 2020-11-22 DIAGNOSIS — M47812 Spondylosis without myelopathy or radiculopathy, cervical region: Secondary | ICD-10-CM | POA: Diagnosis not present

## 2020-11-24 ENCOUNTER — Other Ambulatory Visit: Payer: Self-pay | Admitting: Family Medicine

## 2020-11-24 DIAGNOSIS — Z7901 Long term (current) use of anticoagulants: Secondary | ICD-10-CM

## 2020-11-24 DIAGNOSIS — D6851 Activated protein C resistance: Secondary | ICD-10-CM

## 2020-12-11 DIAGNOSIS — I4892 Unspecified atrial flutter: Secondary | ICD-10-CM | POA: Diagnosis not present

## 2020-12-14 ENCOUNTER — Encounter: Payer: Self-pay | Admitting: Neurology

## 2020-12-14 ENCOUNTER — Ambulatory Visit (INDEPENDENT_AMBULATORY_CARE_PROVIDER_SITE_OTHER): Payer: Medicare Other | Admitting: Neurology

## 2020-12-14 VITALS — BP 139/92 | HR 80 | Ht 68.0 in | Wt 215.6 lb

## 2020-12-14 DIAGNOSIS — G90522 Complex regional pain syndrome I of left lower limb: Secondary | ICD-10-CM | POA: Diagnosis not present

## 2020-12-14 NOTE — Progress Notes (Signed)
Reason for visit: Complex regional pain syndrome  Frederick Payne is an 60 y.o. male  History of present illness:  Frederick Payne is a 60 year old right-handed white male with a history of chronic pain involving the left lower leg.  He indicates that he is now having a lot of problems with his hips on both sides, left greater than right.  This has been a problem for several years but has gotten worse recently.  He finds that he has no pain with sitting but when he stands up and tries to walk he has a lot of discomfort, he cannot roll over on either side because of hip pain.  He has had x-rays of the hips that do not show significant arthritis.  He is followed through a pain center, he is getting injections in the neck area.  He continues to have the left leg pain but he claims that he is used to it and it does not bother him.  He returns to this office for further evaluation.  He is not getting any medications through this office.  Past Medical History:  Diagnosis Date  . Aortic stenosis    Had surgery performed  . CAD (coronary artery disease)   . Chronic anemia   . Deep vein thrombosis ([place]) 2006   After a broken ankle--recently diagnosed with reflex sympathetic dystrophy of the left lower extremity  . Elevated LFTs    h/o excessive alcohol  . Factor V Leiden ([place])   . Hypertension   . Ischemic heart disease   . Psychiatric disorder    Underlying  . PUD (peptic ulcer disease)    with bleeding  . Reflex sympathetic dystrophy     Past Surgical History:  Procedure Laterality Date  . AORTIC VALVE REPLACEMENT  07/15/12   27 mm [employer]  . CORONARY ARTERY BYPASS GRAFT     x1  . GALLBLADDER SURGERY  2008  . gastrectomy and vagotomy    . greenfield filter      Family History  Problem Relation Age of Onset  . Healthy Father   . Stroke Paternal Grandmother     Social history:  reports that he has quit smoking. He has never used smokeless tobacco. He reports that he does not drink  alcohol and does not use drugs.    Allergies  Allergen Reactions  . Benzodiazepines Other (See Comments)    Causes pt to get violent   . Contrast Media [Iodinated Diagnostic Agents] Hives  . Iodine Anaphylaxis  . Other Anaphylaxis    FRUITS FROM TREES.   . Pectin Anaphylaxis  . Shellfish Allergy Anaphylaxis  . Sulfa Antibiotics Anaphylaxis  . Diazepam Other (See Comments)    Headache, strange behavior, memory loss  . Lactose Intolerance (Gi) Nausea And Vomiting    Medications:  Prior to Admission medications   Medication Sig Start Date End Date Taking? Authorizing Provider  albuterol (VENTOLIN HFA) 108 (90 Base) MCG/ACT inhaler INHALE 2 PUFFS BY MOUTH EVERY 4 TO 6 HOURS AS NEEDED 07/10/20  Yes Denita Lung, MD  ARNUITY ELLIPTA 100 MCG/ACT AEPB INHALE 1 PUFF INTO THE LUNGS DAILY 07/11/20  Yes Denita Lung, MD  ascorbic acid (VITAMIN C) 500 MG tablet Take 500 mg by mouth daily.   Yes [provider]  aspirin 81 MG tablet Take 81 mg by mouth daily.   Yes [provider]  COAGUCHEK LANCETS [place] 1 each by Does not apply route as directed. 02/14/16  Yes  Johney Maine, MD  DHA-PHOSPHATIDYLSERINE PO Take 500 mg by mouth.   Yes [provider]  FEROSUL 325 (65 Fe) MG tablet TAKE 1 TABLET BY MOUTH EVERY DAY 08/21/20  Yes Ronnald Nian, MD  fish oil-omega-3 fatty acids 1000 MG capsule Take 1 g by mouth daily.   Yes [provider]  L-Methylfolate-Algae (DEPLIN 15) 15-90.314 MG CAPS Take by mouth.   Yes [provider]  Multiple Vitamins-Minerals (ONE-A-DAY MENS 50+ ADVANTAGE PO) Take by mouth daily.   Yes [provider]  OVER THE COUNTER MEDICATION    Yes [provider]  POLICOSANOL PO Take 20 mg by mouth daily.   Yes [provider]  POLY-IRON 150 150 MG capsule TAKE 1 CAPSULE BY MOUTH EVERY DAY 02/21/20  Yes Ronnald Nian, MD  PT/INR Test [facility] PT TEST) STRP 1 each by In Vitro route as directed.  02/14/16  Yes Johney Maine, MD  PT/INR Testing Monitor (COAGUCHEK XS PLUS SYSTEM) KIT 1 each by Does not apply route as directed. 02/14/16  Yes Johney Maine, MD  S-Adenosylmethionine (SAM-E) 400 MG TABS Take 1 tablet by mouth daily.   Yes [provider]  [facility] Wort 300 MG CAPS Take 300 mg by mouth 2 (two) times daily.   Yes [provider]  Thiamine HCl (B-1) 100 MG TABS Take by mouth.   Yes [provider]  warfarin (COUMADIN) 10 MG tablet TAKE 1 TABLET(10 MG) BY MOUTH DAILY 03/13/20  Yes Ronnald Nian, MD  warfarin (COUMADIN) 2 MG tablet TAKE 1 TABLET BY MOUTH DAILY 01/08/16  Yes Ronnald Nian, MD  warfarin (COUMADIN) 5 MG tablet TAKE 1 TABLET BY MOUTH DAILY 11/24/20  Yes Ronnald Nian, MD  atorvastatin (LIPITOR) 20 MG tablet Take 1 tablet (20 mg total) by mouth daily. 04/01/19 07/05/20  Ronnald Nian, MD    ROS:  Out of a complete 14 system review of symptoms, the patient complains only of the following symptoms, and all other reviewed systems are negative.  Hip pain Neck discomfort  Blood pressure 140/71, pulse 62, height 6' (1.829 m), weight 214 lb 6.4 oz (97.3 kg).  Physical Exam  General: The patient is alert and cooperative at the time of the examination.  Neuromuscular: With internal and external rotation of the hips, the patient reports discomfort in the hip area and groin area bilaterally.  Skin: No significant peripheral edema is noted.   Neurologic Exam  Mental status: The patient is alert and oriented x 3 at the time of the examination. The patient has apparent normal recent and remote memory, with an apparently normal attention span and concentration ability.   Cranial nerves: Facial symmetry is present. Speech is normal, no aphasia or dysarthria is noted. Extraocular movements are full. Visual fields are full.  Motor: The patient has good strength in all 4 extremities.  Sensory examination: Soft touch sensation is  symmetric on the face, arms, and legs.  Coordination: The patient has good finger-nose-finger and heel-to-shin bilaterally.  Gait and station: The patient has a normal gait. Tandem gait is normal. Romberg is negative. No drift is seen.  Reflexes: Deep tendon reflexes are symmetric.   Assessment/Plan:  1.  Complex regional pain syndrome, left lower leg  2.  Bilateral hip discomfort  3.  Chronic neck pain  The patient will continue on with treatment through the pain center.  If the hip problems do not improve with injections, he will contact  our office and I will send him to an orthopedic surgeon for further evaluation.  Otherwise, he will follow up through this office on an as-needed basis.  Jill Alexanders MD 12/14/2020 1:16 PM  [facility] [address] [place], [place] [address]  Phone [phone] Fax [phone]

## 2020-12-18 DIAGNOSIS — D682 Hereditary deficiency of other clotting factors: Secondary | ICD-10-CM | POA: Diagnosis not present

## 2020-12-18 DIAGNOSIS — I4891 Unspecified atrial fibrillation: Secondary | ICD-10-CM | POA: Diagnosis not present

## 2020-12-18 DIAGNOSIS — Z9889 Other specified postprocedural states: Secondary | ICD-10-CM | POA: Diagnosis not present

## 2020-12-18 DIAGNOSIS — Z951 Presence of aortocoronary bypass graft: Secondary | ICD-10-CM | POA: Diagnosis not present

## 2020-12-18 DIAGNOSIS — Z954 Presence of other heart-valve replacement: Secondary | ICD-10-CM | POA: Diagnosis not present

## 2020-12-18 DIAGNOSIS — I2510 Atherosclerotic heart disease of native coronary artery without angina pectoris: Secondary | ICD-10-CM | POA: Diagnosis not present

## 2020-12-18 DIAGNOSIS — I483 Typical atrial flutter: Secondary | ICD-10-CM | POA: Diagnosis not present

## 2020-12-18 DIAGNOSIS — I4892 Unspecified atrial flutter: Secondary | ICD-10-CM | POA: Diagnosis not present

## 2020-12-21 DIAGNOSIS — M7062 Trochanteric bursitis, left hip: Secondary | ICD-10-CM | POA: Diagnosis not present

## 2020-12-21 DIAGNOSIS — M7061 Trochanteric bursitis, right hip: Secondary | ICD-10-CM | POA: Diagnosis not present

## 2020-12-21 DIAGNOSIS — M47812 Spondylosis without myelopathy or radiculopathy, cervical region: Secondary | ICD-10-CM | POA: Diagnosis not present

## 2021-01-10 DIAGNOSIS — M47812 Spondylosis without myelopathy or radiculopathy, cervical region: Secondary | ICD-10-CM | POA: Diagnosis not present

## 2021-01-15 DIAGNOSIS — X32XXXS Exposure to sunlight, sequela: Secondary | ICD-10-CM | POA: Diagnosis not present

## 2021-01-15 DIAGNOSIS — L570 Actinic keratosis: Secondary | ICD-10-CM | POA: Diagnosis not present

## 2021-01-15 DIAGNOSIS — Z86008 Personal history of in-situ neoplasm of other site: Secondary | ICD-10-CM | POA: Diagnosis not present

## 2021-01-15 DIAGNOSIS — I483 Typical atrial flutter: Secondary | ICD-10-CM | POA: Diagnosis not present

## 2021-01-15 DIAGNOSIS — L814 Other melanin hyperpigmentation: Secondary | ICD-10-CM | POA: Diagnosis not present

## 2021-01-16 DIAGNOSIS — S0230XD Fracture of orbital floor, unspecified side, subsequent encounter for fracture with routine healing: Secondary | ICD-10-CM | POA: Diagnosis not present

## 2021-01-16 DIAGNOSIS — Z9889 Other specified postprocedural states: Secondary | ICD-10-CM | POA: Diagnosis not present

## 2021-01-16 DIAGNOSIS — H16223 Keratoconjunctivitis sicca, not specified as Sjogren's, bilateral: Secondary | ICD-10-CM | POA: Diagnosis not present

## 2021-01-16 DIAGNOSIS — H2513 Age-related nuclear cataract, bilateral: Secondary | ICD-10-CM | POA: Diagnosis not present

## 2021-01-25 ENCOUNTER — Other Ambulatory Visit: Payer: Self-pay | Admitting: Family Medicine

## 2021-02-12 DIAGNOSIS — M549 Dorsalgia, unspecified: Secondary | ICD-10-CM | POA: Diagnosis not present

## 2021-02-12 DIAGNOSIS — M542 Cervicalgia: Secondary | ICD-10-CM | POA: Diagnosis not present

## 2021-02-12 DIAGNOSIS — M25562 Pain in left knee: Secondary | ICD-10-CM | POA: Diagnosis not present

## 2021-02-12 DIAGNOSIS — M25561 Pain in right knee: Secondary | ICD-10-CM | POA: Diagnosis not present

## 2021-02-12 DIAGNOSIS — M47812 Spondylosis without myelopathy or radiculopathy, cervical region: Secondary | ICD-10-CM | POA: Diagnosis not present

## 2021-02-12 DIAGNOSIS — M47816 Spondylosis without myelopathy or radiculopathy, lumbar region: Secondary | ICD-10-CM | POA: Diagnosis not present

## 2021-02-12 DIAGNOSIS — G8929 Other chronic pain: Secondary | ICD-10-CM | POA: Diagnosis not present

## 2021-02-12 DIAGNOSIS — G894 Chronic pain syndrome: Secondary | ICD-10-CM | POA: Diagnosis not present

## 2021-02-12 DIAGNOSIS — G90522 Complex regional pain syndrome I of left lower limb: Secondary | ICD-10-CM | POA: Diagnosis not present

## 2021-02-12 DIAGNOSIS — M545 Low back pain, unspecified: Secondary | ICD-10-CM | POA: Diagnosis not present

## 2021-02-12 DIAGNOSIS — M7062 Trochanteric bursitis, left hip: Secondary | ICD-10-CM | POA: Diagnosis not present

## 2021-02-16 ENCOUNTER — Other Ambulatory Visit: Payer: Self-pay | Admitting: Family Medicine

## 2021-02-18 ENCOUNTER — Other Ambulatory Visit: Payer: Self-pay | Admitting: Family Medicine

## 2021-02-19 DIAGNOSIS — I483 Typical atrial flutter: Secondary | ICD-10-CM | POA: Diagnosis not present

## 2021-03-15 ENCOUNTER — Other Ambulatory Visit: Payer: Self-pay | Admitting: Family Medicine

## 2021-03-15 DIAGNOSIS — D6851 Activated protein C resistance: Secondary | ICD-10-CM

## 2021-03-15 DIAGNOSIS — Z7901 Long term (current) use of anticoagulants: Secondary | ICD-10-CM

## 2021-03-26 DIAGNOSIS — I4892 Unspecified atrial flutter: Secondary | ICD-10-CM | POA: Diagnosis not present

## 2021-03-26 DIAGNOSIS — I4891 Unspecified atrial fibrillation: Secondary | ICD-10-CM | POA: Diagnosis not present

## 2021-03-28 DIAGNOSIS — Z23 Encounter for immunization: Secondary | ICD-10-CM | POA: Diagnosis not present

## 2021-04-10 ENCOUNTER — Ambulatory Visit (INDEPENDENT_AMBULATORY_CARE_PROVIDER_SITE_OTHER): Payer: Medicare Other | Admitting: Family Medicine

## 2021-04-10 ENCOUNTER — Other Ambulatory Visit: Payer: Self-pay

## 2021-04-10 VITALS — BP 118/80 | HR 76 | Temp 97.3°F | Wt 197.8 lb

## 2021-04-10 DIAGNOSIS — J4520 Mild intermittent asthma, uncomplicated: Secondary | ICD-10-CM

## 2021-04-10 DIAGNOSIS — I483 Typical atrial flutter: Secondary | ICD-10-CM

## 2021-04-10 DIAGNOSIS — Z7901 Long term (current) use of anticoagulants: Secondary | ICD-10-CM

## 2021-04-10 DIAGNOSIS — G90522 Complex regional pain syndrome I of left lower limb: Secondary | ICD-10-CM

## 2021-04-10 DIAGNOSIS — Z952 Presence of prosthetic heart valve: Secondary | ICD-10-CM

## 2021-04-10 DIAGNOSIS — Z951 Presence of aortocoronary bypass graft: Secondary | ICD-10-CM | POA: Diagnosis not present

## 2021-04-10 DIAGNOSIS — E785 Hyperlipidemia, unspecified: Secondary | ICD-10-CM | POA: Diagnosis not present

## 2021-04-10 DIAGNOSIS — K709 Alcoholic liver disease, unspecified: Secondary | ICD-10-CM | POA: Diagnosis not present

## 2021-04-10 DIAGNOSIS — Z1211 Encounter for screening for malignant neoplasm of colon: Secondary | ICD-10-CM | POA: Diagnosis not present

## 2021-04-10 NOTE — Progress Notes (Signed)
Subjective:    Patient ID: Frederick Payne, male    DOB: 07-27-1961, 60 y.o.   MRN: [id number]  HPI He is here for an interval evaluation.  He does have a history of A. fib as well as CABG and implant and presently is on Coumadin.  He is checking his Coumadin levels at home and he says they are in the therapeutic range.  He is also had epidural injections for cervical spondylosis.  He has not had any alcohol consumption in 3 years.  Does have a previous history of alcoholic liver disease.  He was recently married.  Planning on moving to the [place]. His allergy and asthma seem to be under good control.  He is in chronic pain management and seems to be fairly stable.  Review of his record indicates he needs follow-up on colon cancer screening.  Review of Systems     Objective:   Physical Exam Alert and in no distress. Tympanic membranes and canals are normal. Pharyngeal area is normal. Neck is supple without adenopathy or thyromegaly. Cardiac exam shows a regular sinus rhythm without murmurs or gallops. Lungs are clear to auscultation.        Assessment & Plan:  Typical atrial flutter (HCC)  Complex regional pain syndrome type 1 of left lower extremity  S/P CABG x 1 - Plan: CBC with Differential/Platelet, Comprehensive metabolic panel, Lipid panel  S/P AVR  Asthma with allergic rhinitis, mild intermittent, uncomplicated  Alcoholic liver disease (HCC) - Plan: CBC with Differential/Platelet, Comprehensive metabolic panel  Chronic anticoagulation  Screening for colon cancer - Plan: Cologuard  Hyperlipidemia, unspecified hyperlipidemia type - Plan: Lipid panel I congratulated him on his sobriety and his marriage.  He will continue to check his PT/INR at home concerning his Coumadin and follow-up with cardiology regularly.  We will also follow-up concerning the chronic pain and cervical spondylosis.

## 2021-04-11 LAB — CBC WITH DIFFERENTIAL/PLATELET
Basophils Absolute: 0 10*3/uL (ref 0.0–0.2)
Basos: 1 %
EOS (ABSOLUTE): 0.1 10*3/uL (ref 0.0–0.4)
Eos: 2 %
Hematocrit: 47.8 % (ref 37.5–51.0)
Hemoglobin: 15.7 g/dL (ref 13.0–17.7)
Immature Grans (Abs): 0 10*3/uL (ref 0.0–0.1)
Immature Granulocytes: 0 %
Lymphocytes Absolute: 0.7 10*3/uL (ref 0.7–3.1)
Lymphs: 17 %
MCH: 28.5 pg (ref 26.6–33.0)
MCHC: 32.8 g/dL (ref 31.5–35.7)
MCV: 87 fL (ref 79–97)
Monocytes Absolute: 0.3 10*3/uL (ref 0.1–0.9)
Monocytes: 6 %
Neutrophils Absolute: 3 10*3/uL (ref 1.4–7.0)
Neutrophils: 74 %
Platelets: 161 10*3/uL (ref 150–450)
RBC: 5.51 x10E6/uL (ref 4.14–5.80)
RDW: 13.8 % (ref 11.6–15.4)
WBC: 4.1 10*3/uL (ref 3.4–10.8)

## 2021-04-11 LAB — COMPREHENSIVE METABOLIC PANEL
ALT: 19 IU/L (ref 0–44)
AST: 22 IU/L (ref 0–40)
Albumin/Globulin Ratio: 1.7 (ref 1.2–2.2)
Albumin: 4.5 g/dL (ref 3.8–4.9)
Alkaline Phosphatase: 49 IU/L (ref 44–121)
BUN/Creatinine Ratio: 9 (ref 9–20)
BUN: 9 mg/dL (ref 6–24)
Bilirubin Total: 0.5 mg/dL (ref 0.0–1.2)
CO2: 22 mmol/L (ref 20–29)
Calcium: 9.7 mg/dL (ref 8.7–10.2)
Chloride: 101 mmol/L (ref 96–106)
Creatinine, Ser: 0.99 mg/dL (ref 0.76–1.27)
Globulin, Total: 2.7 g/dL (ref 1.5–4.5)
Glucose: 101 mg/dL — ABNORMAL HIGH (ref 65–99)
Potassium: 4.6 mmol/L (ref 3.5–5.2)
Sodium: 139 mmol/L (ref 134–144)
Total Protein: 7.2 g/dL (ref 6.0–8.5)
eGFR: 88 mL/min/{1.73_m2} (ref >59)

## 2021-04-11 LAB — LIPID PANEL
Chol/HDL Ratio: 6.2 ratio — ABNORMAL HIGH (ref 0.0–5.0)
Cholesterol, Total: 235 mg/dL — ABNORMAL HIGH (ref 100–199)
HDL: 38 mg/dL — ABNORMAL LOW (ref >39)
LDL Chol Calc (NIH): 164 mg/dL — ABNORMAL HIGH (ref 0–99)
Triglycerides: 181 mg/dL — ABNORMAL HIGH (ref 0–149)
VLDL Cholesterol Cal: 33 mg/dL (ref 5–40)

## 2021-04-12 ENCOUNTER — Other Ambulatory Visit: Payer: Self-pay | Admitting: Family Medicine

## 2021-04-13 ENCOUNTER — Telehealth: Payer: Self-pay | Admitting: Neurology

## 2021-04-13 NOTE — Telephone Encounter (Signed)
Pt called stating that he is having a lot of issues in the Left leg and is wanting to be advised or scheduled with the provider. No soon appts available with MD nor NP

## 2021-04-16 DIAGNOSIS — G8929 Other chronic pain: Secondary | ICD-10-CM | POA: Diagnosis not present

## 2021-04-16 DIAGNOSIS — G90522 Complex regional pain syndrome I of left lower limb: Secondary | ICD-10-CM | POA: Diagnosis not present

## 2021-04-16 DIAGNOSIS — M47812 Spondylosis without myelopathy or radiculopathy, cervical region: Secondary | ICD-10-CM | POA: Diagnosis not present

## 2021-04-16 DIAGNOSIS — M542 Cervicalgia: Secondary | ICD-10-CM | POA: Diagnosis not present

## 2021-04-16 DIAGNOSIS — M545 Low back pain, unspecified: Secondary | ICD-10-CM | POA: Diagnosis not present

## 2021-04-16 DIAGNOSIS — M25562 Pain in left knee: Secondary | ICD-10-CM | POA: Diagnosis not present

## 2021-04-16 DIAGNOSIS — M549 Dorsalgia, unspecified: Secondary | ICD-10-CM | POA: Diagnosis not present

## 2021-04-16 DIAGNOSIS — G894 Chronic pain syndrome: Secondary | ICD-10-CM | POA: Diagnosis not present

## 2021-04-16 DIAGNOSIS — M47816 Spondylosis without myelopathy or radiculopathy, lumbar region: Secondary | ICD-10-CM | POA: Diagnosis not present

## 2021-04-16 DIAGNOSIS — M7062 Trochanteric bursitis, left hip: Secondary | ICD-10-CM | POA: Diagnosis not present

## 2021-04-16 DIAGNOSIS — M25561 Pain in right knee: Secondary | ICD-10-CM | POA: Diagnosis not present

## 2021-04-16 NOTE — Telephone Encounter (Signed)
I called the patient.  He is having worsening pain of the left leg, he is not really on any medications for the pain.  I discussed potentially starting gabapentin or Lyrica, he is not sure that he wants to go on a new medication, he will let me know if he wishes to do this.

## 2021-04-18 ENCOUNTER — Other Ambulatory Visit: Payer: Self-pay | Admitting: Neurology

## 2021-04-18 MED ORDER — GABAPENTIN 300 MG PO CAPS: ORAL_CAPSULE | ORAL | 3 refills | Status: DC

## 2021-04-30 DIAGNOSIS — I483 Typical atrial flutter: Secondary | ICD-10-CM | POA: Diagnosis not present

## 2021-05-08 DIAGNOSIS — Z1211 Encounter for screening for malignant neoplasm of colon: Secondary | ICD-10-CM | POA: Diagnosis not present

## 2021-05-09 LAB — COLOGUARD: Cologuard: NEGATIVE

## 2021-05-14 LAB — EXTERNAL GENERIC LAB PROCEDURE: COLOGUARD: NEGATIVE

## 2021-05-14 LAB — COLOGUARD: COLOGUARD: NEGATIVE

## 2021-05-15 NOTE — Progress Notes (Signed)
Advised pt of negative cologuard results. Kh

## 2021-06-04 DIAGNOSIS — I483 Typical atrial flutter: Secondary | ICD-10-CM | POA: Diagnosis not present

## 2021-06-17 ENCOUNTER — Other Ambulatory Visit: Payer: Self-pay | Admitting: Family Medicine

## 2021-06-18 ENCOUNTER — Telehealth: Payer: Self-pay

## 2021-06-18 DIAGNOSIS — Z9889 Other specified postprocedural states: Secondary | ICD-10-CM | POA: Diagnosis not present

## 2021-06-18 DIAGNOSIS — D682 Hereditary deficiency of other clotting factors: Secondary | ICD-10-CM | POA: Diagnosis not present

## 2021-06-18 DIAGNOSIS — I4892 Unspecified atrial flutter: Secondary | ICD-10-CM | POA: Diagnosis not present

## 2021-06-18 DIAGNOSIS — R55 Syncope and collapse: Secondary | ICD-10-CM | POA: Diagnosis not present

## 2021-06-18 DIAGNOSIS — I4891 Unspecified atrial fibrillation: Secondary | ICD-10-CM | POA: Diagnosis not present

## 2021-06-18 DIAGNOSIS — I2510 Atherosclerotic heart disease of native coronary artery without angina pectoris: Secondary | ICD-10-CM | POA: Diagnosis not present

## 2021-06-18 DIAGNOSIS — I483 Typical atrial flutter: Secondary | ICD-10-CM | POA: Diagnosis not present

## 2021-06-18 DIAGNOSIS — Z951 Presence of aortocoronary bypass graft: Secondary | ICD-10-CM | POA: Diagnosis not present

## 2021-06-18 DIAGNOSIS — Z954 Presence of other heart-valve replacement: Secondary | ICD-10-CM | POA: Diagnosis not present

## 2021-06-18 NOTE — Telephone Encounter (Signed)
This ok to refill.

## 2021-06-18 NOTE — Telephone Encounter (Signed)
Received a request for a refill on Ventolin inhaler pt. Last apt 04/10/21.

## 2021-06-20 ENCOUNTER — Ambulatory Visit (INDEPENDENT_AMBULATORY_CARE_PROVIDER_SITE_OTHER): Payer: Medicare Other | Admitting: Neurology

## 2021-06-20 ENCOUNTER — Encounter: Payer: Self-pay | Admitting: Neurology

## 2021-06-20 ENCOUNTER — Telehealth: Payer: Self-pay | Admitting: Neurology

## 2021-06-20 VITALS — BP 150/102 | HR 92 | Ht 68.0 in | Wt 194.5 lb

## 2021-06-20 DIAGNOSIS — R55 Syncope and collapse: Secondary | ICD-10-CM | POA: Diagnosis not present

## 2021-06-20 DIAGNOSIS — H8110 Benign paroxysmal vertigo, unspecified ear: Secondary | ICD-10-CM | POA: Diagnosis not present

## 2021-06-20 NOTE — Telephone Encounter (Signed)
I will place another CT order.

## 2021-06-20 NOTE — Telephone Encounter (Signed)
Medicare order sent to GI. NPR they will reach out to the patient to schedule.

## 2021-06-20 NOTE — Progress Notes (Signed)
Reason for visit: Vertigo, left leg pain, syncope  Frederick Payne is an 60 y.o. male  History of present illness:  Frederick Payne is a 60 year old right-handed white male with a history of chronic left leg pain.  He just recently had a spinal stimulator placed in the low back.  He comes in to the office today for evaluation of several new problems.  The patient reports that 6 days ago he began having spontaneous onset of vertigo.  He got up in the morning around 4 AM to go the bathroom and had severe dizziness and vertigo, he staggered but did not fall.  He had no nausea or vomiting.  He has bilateral tinnitus, but this is a chronic issue.  No change in hearing has been noted.  He reports no double vision, loss of vision, slurred speech, or numbness or weakness of the face, arms, or legs that is new or different.  At times, he may have some numbness in the arms when he wakes up in the morning.  He does have chronic neck pain but this has not increased recently.  He denies any significant headache but does have some sinus pressure at times in the frontal regions that has been present off and on for several years.  He reports a 17 pound weight loss over the last several months.  He was started on gabapentin in June 2022, but he has been on a stable dose over the last couple months.  The patient also reports a 1 to 2-year history of intermittent blackouts.  He has had 3 such episodes so far this year.  The last episode was 2 to 3 weeks ago.  He has been seen through cardiology, a loop recorder was placed and he was told that it was not related to any cardiac rhythm problem.  The patient reports that he has had 2 episodes of loss of consciousness while sitting.  The patient may feel lightheaded and gets diaphoretic.  He may have some nausea associated with events.  He is not sure but he feels that he may have some increasing leg pain associated with these events.  The patient may look flushed in the face with  the episodes, he may be unconscious for 1 to 2 minutes, he does not bite his tongue or lose control of the bowels or the bladder.  He comes to this office for further evaluation.  Past Medical History:  Diagnosis Date   Aortic stenosis    Had surgery performed   CAD (coronary artery disease)    Chronic anemia    Deep vein thrombosis ([facility]) 2006   After a broken ankle--recently diagnosed with reflex sympathetic dystrophy of the left lower extremity   Elevated LFTs    h/o excessive alcohol   Factor V Leiden ([name])    Hypertension    Ischemic heart disease    Psychiatric disorder    Underlying   PUD (peptic ulcer disease)    with bleeding   Reflex sympathetic dystrophy     Past Surgical History:  Procedure Laterality Date   AORTIC VALVE REPLACEMENT  07/15/12   27 mm St Jude   CORONARY ARTERY BYPASS GRAFT     x1   GALLBLADDER SURGERY  2008   gastrectomy and vagotomy     greenfield filter      Family History  Problem Relation Age of Onset   Healthy Father    Stroke Paternal Grandmother     Social history:  reports  that he has quit smoking. He has never used smokeless tobacco. He reports that he does not drink alcohol and does not use drugs.    Allergies  Allergen Reactions   Benzodiazepines Other (See Comments)    Causes pt to get violent    Contrast Media [Iodinated Diagnostic Agents] Hives   Iodine Anaphylaxis   Other Anaphylaxis    FRUITS FROM TREES.    Pectin Anaphylaxis   Shellfish Allergy Anaphylaxis   Sulfa Antibiotics Anaphylaxis   Diazepam Other (See Comments)    Headache, strange behavior, memory loss   Lactose Intolerance (Gi) Nausea And Vomiting    Medications:  Prior to Admission medications   Medication Sig Start Date End Date Taking? Authorizing Provider  albuterol (VENTOLIN HFA) 108 (90 Base) MCG/ACT inhaler INHALE 2 PUFFS BY MOUTH EVERY 4 TO 6 HOURS AS NEEDED 06/18/21  Yes Denita Lung, MD  ARNUITY ELLIPTA 100 MCG/ACT AEPB INHALE 1 PUFF INTO  THE LUNGS DAILY 07/11/20  Yes Denita Lung, MD  ascorbic acid (VITAMIN C) 500 MG tablet Take 500 mg by mouth daily.   Yes [provider]  aspirin 81 MG tablet Take 81 mg by mouth daily.   Yes [provider]  COAGUCHEK LANCETS [place] 1 each by Does not apply route as directed. 02/14/16  Yes Brunetta Genera, MD  DHA-PHOSPHATIDYLSERINE PO Take 500 mg by mouth.   Yes [provider]  Ferrous Sulfate (IRON) 325 (65 Fe) MG TABS TAKE 1 TABLET BY MOUTH EVERY DAY 02/19/21  Yes Denita Lung, MD  fish oil-omega-3 fatty acids 1000 MG capsule Take 1 g by mouth daily.   Yes [provider]  gabapentin (NEURONTIN) 300 MG capsule 1 capsule twice daily for 1 week, then take 1 in the morning and 2 in the evening 04/18/21  Yes Kathrynn Ducking, MD  iron polysaccharides (NIFEREX) 150 MG capsule TAKE 1 CAPSULE BY MOUTH EVERY DAY 04/12/21  Yes Denita Lung, MD  L-Methylfolate-Algae (DEPLIN 15) 15-90.314 MG CAPS Take by mouth.   Yes [provider]  Multiple Vitamins-Minerals (ONE-A-DAY MENS 50+ ADVANTAGE PO) Take by mouth daily.   Yes [provider]  OVER THE COUNTER MEDICATION    Yes [provider]  POLICOSANOL PO Take 20 mg by mouth daily.   Yes [provider]  PT/INR Test (COAGUCHEK PT TEST) STRP 1 each by In Vitro route as directed. 02/14/16  Yes Brunetta Genera, MD  PT/INR Testing Monitor (COAGUCHEK XS PLUS SYSTEM) KIT 1 each by Does not apply route as directed. 02/14/16  Yes Brunetta Genera, MD  S-Adenosylmethionine (SAM-E) 400 MG TABS Take 1 tablet by mouth daily.   Yes [provider]  [facility] Wort 300 MG CAPS Take 300 mg by mouth 2 (two) times daily.   Yes [provider]  [facility] Wort 300 MG CAPS Take by mouth.   Yes [provider]  Thiamine HCl (B-1) 100 MG TABS Take by mouth.   Yes [provider]  warfarin (COUMADIN) 10 MG tablet TAKE 1 TABLET(10 MG) BY MOUTH DAILY 03/16/21   Yes Denita Lung, MD  warfarin (COUMADIN) 2 MG tablet TAKE 1 TABLET BY MOUTH DAILY 01/08/16  Yes Denita Lung, MD  warfarin (COUMADIN) 5 MG tablet TAKE 1 TABLET BY MOUTH DAILY 11/24/20  Yes Denita Lung, MD  atorvastatin (LIPITOR) 20 MG tablet Take 1 tablet (20 mg total) by mouth daily. 04/01/19 07/05/20  Denita Lung,  MD    ROS:  Out of a complete 14 system review of symptoms, the patient complains only of the following symptoms, and all other reviewed systems are negative.  Syncope Left leg pain Vertigo  Blood pressure (!) 150/102, pulse 92, height _0  (1.727 m), weight 194 lb 8 oz (88.2 kg).  Physical Exam  General: The patient is alert and cooperative at the time of the examination.  Ears: Tympanic membranes are clear bilaterally.  Respiratory: Lung fields are clear.  Neck: Neck is supple, no carotid bruits are noted.  Cardiovascular: Regular rate and rhythm, no murmurs or rubs are noted.  Eyes: Pupils are equal, round, and reactive to light.  Discs are flat bilaterally.  Skin: No significant peripheral edema is noted.   Neurologic Exam  Mental status: The patient is alert and oriented x 3 at the time of the examination. The patient has apparent normal recent and remote memory, with an apparently normal attention span and concentration ability.   Cranial nerves: Facial symmetry is present. Speech is normal, no aphasia or dysarthria is noted. Extraocular movements are full. Visual fields are full.  Motor: The patient has good strength in all 4 extremities.  Sensory examination: Soft touch sensation is symmetric on the face, arms, and legs.  Pinprick, vibration sensation are symmetric throughout.  Coordination: The patient has good finger-nose-finger and heel-to-shin bilaterally. The Nyan-Barrany procedure was done.  With the patient tilted back and head turned to the left, the patient developed prominent horizontal and rotational nystagmus when looking to the  left with subjective symptoms of dizziness.  The event lasted about 15 to 20 seconds and then resolved.  Gait and station: The patient has a normal gait. Tandem gait is normal. Romberg is negative. No drift is seen.  Reflexes: Deep tendon reflexes are symmetric.   Assessment/Plan:  1.  Chronic left leg pain  2.  Positional vertigo  3.  Recurrent syncope, possible vasovagal syncope  The patient will be set up for CT of the brain, he cannot have MRI given the spinal stimulator placement.  The patient will undergo an EEG study.  He will be referred to physical therapy for vestibular rehab.  He will follow-up here for his next scheduled visit in December 2022.  In the future, he can be followed through Dr. Krista Blue.  Jill Alexanders MD 06/20/2021 11:50 AM  [facility] [address] [place], [place] [address]  Phone [phone] Fax [phone]

## 2021-06-20 NOTE — Telephone Encounter (Signed)
When you get a chance can you put a new CT Head order in with out the (56m).

## 2021-06-21 ENCOUNTER — Telehealth: Payer: Self-pay | Admitting: Neurology

## 2021-06-21 NOTE — Telephone Encounter (Signed)
EEG order sent to [facility] for scheduling

## 2021-06-27 ENCOUNTER — Other Ambulatory Visit: Payer: Self-pay

## 2021-06-27 ENCOUNTER — Telehealth: Payer: Self-pay | Admitting: Neurology

## 2021-06-27 ENCOUNTER — Ambulatory Visit (INDEPENDENT_AMBULATORY_CARE_PROVIDER_SITE_OTHER): Payer: Medicare Other

## 2021-06-27 DIAGNOSIS — R55 Syncope and collapse: Secondary | ICD-10-CM

## 2021-06-27 NOTE — Procedures (Signed)
    History:  Frederick Payne is a 60 year old patient with a history of episodes of syncope that have occurred 2-3 times over the last 8 months.  He will get dizzy, have diaphoresis and some mild nausea with brief episode of loss of consciousness.  He is being evaluated for these events.  This is a routine EEG.  No skull defects are noted.  Medications include albuterol, gabapentin, Coumadin, Ellipta, and Lipitor.  EEG classification: Normal awake  Description of the recording: The background rhythms of this recording consists of a fairly well modulated medium amplitude alpha rhythm of 9 Hz that is reactive to eye opening and closure. As the record progresses, the patient appears to remain in the waking state throughout the recording. Photic stimulation was performed, resulting in a bilateral and symmetric photic driving response. Hyperventilation was also performed, resulting in a minimal buildup of the background rhythm activities without significant slowing seen. At no time during the recording does there appear to be evidence of spike or spike wave discharges or evidence of focal slowing. EKG monitor shows no evidence of cardiac rhythm abnormalities with a heart rate of 72.  Impression: This is a normal EEG recording in the waking state. No evidence of ictal or interictal discharges are seen.

## 2021-06-27 NOTE — Telephone Encounter (Signed)
I called the patient.  The EEG study was unremarkable, he will be entering into vestibular rehab for the vertigo.

## 2021-06-27 NOTE — Progress Notes (Signed)
Routine EEG completed at [facility] with photic stimulation without hyperventilation.  Full report to follow by Dr. Roxy Cedar, M.D.

## 2021-07-03 ENCOUNTER — Other Ambulatory Visit: Payer: Self-pay

## 2021-07-03 ENCOUNTER — Ambulatory Visit: Payer: Medicare Other | Attending: Neurology | Admitting: Physical Therapy

## 2021-07-03 ENCOUNTER — Encounter: Payer: Self-pay | Admitting: Physical Therapy

## 2021-07-03 VITALS — BP 131/93 | HR 84

## 2021-07-03 DIAGNOSIS — R42 Dizziness and giddiness: Secondary | ICD-10-CM | POA: Diagnosis not present

## 2021-07-03 DIAGNOSIS — H8112 Benign paroxysmal vertigo, left ear: Secondary | ICD-10-CM | POA: Insufficient documentation

## 2021-07-03 DIAGNOSIS — R2681 Unsteadiness on feet: Secondary | ICD-10-CM | POA: Diagnosis not present

## 2021-07-03 NOTE — Therapy (Signed)
both ears 02/16/2020   OSA (obstructive sleep apnea) 06/21/2019   Snoring 06/16/2019   B12 deficiency [id number]   Alcoholic liver disease ([place]) 01/23/2016   History of alcohol abuse 09/19/2015   Thyroid nodule 08/29/2015   Thrombocytopenia ([place]) 07/25/2014   Asthma with allergic rhinitis 02/08/2014   Allergic rhinitis 02/08/2014   Anemia 02/08/2014   Complex regional pain syndrome i of left lower limb 02/08/2014   Opioid dependence in remission ([place]) 08/18/2013   Depression, major, in  remission ([place]) 03/03/2013   PTSD (post-traumatic stress disorder) 03/03/2013   Postphlebitic syndrome 02/25/2013   History of DVT (deep vein thrombosis) 01/20/2013   Anticoagulation goal of INR 2.5 to 3.5 09/09/2012   S/P AVR 08/17/2012   Typical atrial flutter ([place]) 08/17/2012   Other primary thrombophilia ([place]) 08/17/2012   Factor V deficiency ([place]) 08/17/2012   S/P CABG x 1 07/23/2012   Long term (current) use of anticoagulants 07/21/2012   S/P cardiac cath 05/06/2012   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  [facility] [address] [place], [place], [address] Phone: [phone]   Fax:  [phone]  Physical Therapy Evaluation  Patient Details  Name: Frederick [facility] MRN: [id number] Date of Birth: 04-Aug-1961 Referring Provider (PT): Kathrynn Ducking, MD   Encounter Date: 07/03/2021   PT End of Session - 07/03/21 1203     Visit Number 1    Number of Visits 5    Date for PT Re-Evaluation 08/02/21    Authorization Type Medicare    Progress Note Due on Visit 10    PT Start Time 1017    PT Stop Time 1100    PT Time Calculation (min) 43 min    Activity Tolerance Patient tolerated treatment well    Behavior During Therapy [facility] for tasks assessed/performed             Past Medical History:  Diagnosis Date   Aortic stenosis    Had surgery performed   CAD (coronary artery disease)    Chronic anemia    Deep vein thrombosis ([place]) 2006   After a broken ankle--recently diagnosed with reflex sympathetic dystrophy of the left lower extremity   Elevated LFTs    h/o excessive alcohol   Factor V Leiden ([place])    Hypertension    Ischemic heart disease    Psychiatric disorder    Underlying   PUD (peptic ulcer disease)    with bleeding   Reflex sympathetic dystrophy     Past Surgical History:  Procedure Laterality Date   AORTIC VALVE REPLACEMENT  07/15/12   27 mm St Jude   CORONARY ARTERY BYPASS GRAFT     x1   GALLBLADDER SURGERY  2008   gastrectomy and vagotomy     greenfield filter      Vitals:   07/03/21 1023  BP: (!) 131/93  Pulse: 84      Subjective Assessment - 07/03/21 1025     Subjective Over the past couple of years pt has a history of sudden passing out - pt has warning signs of syncope - cramping in abdomen and then pt has a syncope episode.  Is undergoing work up by neurology and cardiology - tilt table test.  Pt also reports onset of vertigo 3 weeks ago with changing positions in bed and when sitting  tilting his head back and to the L.    Pertinent History normal EEG, syncope, chronic LLE pain - reflex sympathetic dystrophy, spinal stimulator in low back, bilat tinnitus, chronic neck pain, aortic stenosis, CAD, anemia, DVT, elevated liver function tests, HTN, psychiatric disorder, peptic ulcer disease, Factor V Leiden, aortic valve replacement, CABG, gallbladder surgery, PTSD  Has seen cardiology, loop recorder, no cardiac abnormalities    Diagnostic tests EEG    Patient Stated Goals Not sure what therapy can do for this dizziness    Currently in Pain? Yes    Pain Location Back    Pain Orientation Lower    Pain Descriptors / Indicators Discomfort    Pain Type Chronic pain                [facility] PT Assessment - 07/03/21 1030       Assessment   Medical Diagnosis Vertigo    Referring Provider (PT) Kathrynn Ducking, MD    Onset Date/Surgical Date 06/20/21    Prior Therapy not for vertigo      Precautions   Precautions Other (comment)    Precaution Comments normal EEG, syncope, chronic  both ears 02/16/2020   OSA (obstructive sleep apnea) 06/21/2019   Snoring 06/16/2019   B12 deficiency [id number]   Alcoholic liver disease ([place]) 01/23/2016   History of alcohol abuse 09/19/2015   Thyroid nodule 08/29/2015   Thrombocytopenia ([place]) 07/25/2014   Asthma with allergic rhinitis 02/08/2014   Allergic rhinitis 02/08/2014   Anemia 02/08/2014   Complex regional pain syndrome i of left lower limb 02/08/2014   Opioid dependence in remission ([place]) 08/18/2013   Depression, major, in  remission ([place]) 03/03/2013   PTSD (post-traumatic stress disorder) 03/03/2013   Postphlebitic syndrome 02/25/2013   History of DVT (deep vein thrombosis) 01/20/2013   Anticoagulation goal of INR 2.5 to 3.5 09/09/2012   S/P AVR 08/17/2012   Typical atrial flutter ([place]) 08/17/2012   Other primary thrombophilia ([place]) 08/17/2012   Factor V deficiency ([place]) 08/17/2012   S/P CABG x 1 07/23/2012   Long term (current) use of anticoagulants 07/21/2012   S/P cardiac cath 05/06/2012   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  both ears 02/16/2020   OSA (obstructive sleep apnea) 06/21/2019   Snoring 06/16/2019   B12 deficiency [id number]   Alcoholic liver disease ([place]) 01/23/2016   History of alcohol abuse 09/19/2015   Thyroid nodule 08/29/2015   Thrombocytopenia ([place]) 07/25/2014   Asthma with allergic rhinitis 02/08/2014   Allergic rhinitis 02/08/2014   Anemia 02/08/2014   Complex regional pain syndrome i of left lower limb 02/08/2014   Opioid dependence in remission ([place]) 08/18/2013   Depression, major, in  remission ([place]) 03/03/2013   PTSD (post-traumatic stress disorder) 03/03/2013   Postphlebitic syndrome 02/25/2013   History of DVT (deep vein thrombosis) 01/20/2013   Anticoagulation goal of INR 2.5 to 3.5 09/09/2012   S/P AVR 08/17/2012   Typical atrial flutter ([place]) 08/17/2012   Other primary thrombophilia ([place]) 08/17/2012   Factor V deficiency ([place]) 08/17/2012   S/P CABG x 1 07/23/2012   Long term (current) use of anticoagulants 07/21/2012   S/P cardiac cath 05/06/2012   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

## 2021-07-09 DIAGNOSIS — I483 Typical atrial flutter: Secondary | ICD-10-CM | POA: Diagnosis not present

## 2021-07-10 ENCOUNTER — Other Ambulatory Visit: Payer: Self-pay

## 2021-07-10 ENCOUNTER — Ambulatory Visit
Admission: RE | Admit: 2021-07-10 | Discharge: 2021-07-10 | Disposition: A | Discharge status: Home / Self Care | Payer: Medicare Other | Source: Ambulatory Visit | Attending: Neurology | Admitting: Neurology

## 2021-07-10 DIAGNOSIS — R55 Syncope and collapse: Secondary | ICD-10-CM

## 2021-07-12 ENCOUNTER — Telehealth: Payer: Self-pay | Admitting: Neurology

## 2021-07-12 NOTE — Telephone Encounter (Signed)
I called the patient.  CT of the head is unremarkable.    CT head 07/12/21:  IMPRESSION:    Normal CT head (without).

## 2021-07-13 ENCOUNTER — Ambulatory Visit: Payer: Medicare Other | Admitting: Physical Therapy

## 2021-07-14 ENCOUNTER — Other Ambulatory Visit: Payer: Self-pay | Admitting: Family Medicine

## 2021-07-16 DIAGNOSIS — Z23 Encounter for immunization: Secondary | ICD-10-CM | POA: Diagnosis not present

## 2021-07-18 DIAGNOSIS — Z86008 Personal history of in-situ neoplasm of other site: Secondary | ICD-10-CM | POA: Diagnosis not present

## 2021-07-18 DIAGNOSIS — L738 Other specified follicular disorders: Secondary | ICD-10-CM | POA: Diagnosis not present

## 2021-07-18 DIAGNOSIS — L821 Other seborrheic keratosis: Secondary | ICD-10-CM | POA: Diagnosis not present

## 2021-07-18 DIAGNOSIS — H61002 Unspecified perichondritis of left external ear: Secondary | ICD-10-CM | POA: Diagnosis not present

## 2021-07-18 DIAGNOSIS — L570 Actinic keratosis: Secondary | ICD-10-CM | POA: Diagnosis not present

## 2021-07-18 DIAGNOSIS — L820 Inflamed seborrheic keratosis: Secondary | ICD-10-CM | POA: Diagnosis not present

## 2021-07-18 DIAGNOSIS — L218 Other seborrheic dermatitis: Secondary | ICD-10-CM | POA: Diagnosis not present

## 2021-07-18 DIAGNOSIS — D225 Melanocytic nevi of trunk: Secondary | ICD-10-CM | POA: Diagnosis not present

## 2021-07-19 DIAGNOSIS — R55 Syncope and collapse: Secondary | ICD-10-CM | POA: Diagnosis not present

## 2021-08-10 ENCOUNTER — Other Ambulatory Visit: Payer: Self-pay | Admitting: Family Medicine

## 2021-08-10 DIAGNOSIS — D6851 Activated protein C resistance: Secondary | ICD-10-CM

## 2021-08-10 DIAGNOSIS — Z23 Encounter for immunization: Secondary | ICD-10-CM | POA: Diagnosis not present

## 2021-08-10 DIAGNOSIS — Z7901 Long term (current) use of anticoagulants: Secondary | ICD-10-CM

## 2021-08-13 DIAGNOSIS — I483 Typical atrial flutter: Secondary | ICD-10-CM | POA: Diagnosis not present

## 2021-08-20 ENCOUNTER — Other Ambulatory Visit: Payer: Self-pay | Admitting: Family Medicine

## 2021-08-20 DIAGNOSIS — Z7901 Long term (current) use of anticoagulants: Secondary | ICD-10-CM

## 2021-08-20 DIAGNOSIS — D6851 Activated protein C resistance: Secondary | ICD-10-CM

## 2021-08-22 DIAGNOSIS — M545 Low back pain, unspecified: Secondary | ICD-10-CM | POA: Diagnosis not present

## 2021-08-22 DIAGNOSIS — M47817 Spondylosis without myelopathy or radiculopathy, lumbosacral region: Secondary | ICD-10-CM | POA: Diagnosis not present

## 2021-08-22 DIAGNOSIS — M47815 Spondylosis without myelopathy or radiculopathy, thoracolumbar region: Secondary | ICD-10-CM | POA: Diagnosis not present

## 2021-08-22 DIAGNOSIS — M5459 Other low back pain: Secondary | ICD-10-CM | POA: Diagnosis not present

## 2021-08-27 DIAGNOSIS — I2510 Atherosclerotic heart disease of native coronary artery without angina pectoris: Secondary | ICD-10-CM | POA: Diagnosis not present

## 2021-08-27 DIAGNOSIS — D682 Hereditary deficiency of other clotting factors: Secondary | ICD-10-CM | POA: Diagnosis not present

## 2021-08-27 DIAGNOSIS — I4892 Unspecified atrial flutter: Secondary | ICD-10-CM | POA: Diagnosis not present

## 2021-08-27 DIAGNOSIS — I4891 Unspecified atrial fibrillation: Secondary | ICD-10-CM | POA: Diagnosis not present

## 2021-08-27 DIAGNOSIS — Z954 Presence of other heart-valve replacement: Secondary | ICD-10-CM | POA: Diagnosis not present

## 2021-08-27 DIAGNOSIS — Z951 Presence of aortocoronary bypass graft: Secondary | ICD-10-CM | POA: Diagnosis not present

## 2021-08-27 DIAGNOSIS — I483 Typical atrial flutter: Secondary | ICD-10-CM | POA: Diagnosis not present

## 2021-08-27 DIAGNOSIS — R55 Syncope and collapse: Secondary | ICD-10-CM | POA: Diagnosis not present

## 2021-08-27 DIAGNOSIS — Z9889 Other specified postprocedural states: Secondary | ICD-10-CM | POA: Diagnosis not present

## 2021-09-17 DIAGNOSIS — I483 Typical atrial flutter: Secondary | ICD-10-CM | POA: Diagnosis not present

## 2021-10-01 ENCOUNTER — Other Ambulatory Visit: Payer: Self-pay

## 2021-10-01 ENCOUNTER — Ambulatory Visit (INDEPENDENT_AMBULATORY_CARE_PROVIDER_SITE_OTHER): Payer: Medicare Other | Admitting: Family Medicine

## 2021-10-01 ENCOUNTER — Encounter: Payer: Self-pay | Admitting: Family Medicine

## 2021-10-01 VITALS — BP 140/90 | HR 79 | Temp 98.1°F | Wt 187.8 lb

## 2021-10-01 DIAGNOSIS — M47812 Spondylosis without myelopathy or radiculopathy, cervical region: Secondary | ICD-10-CM

## 2021-10-01 DIAGNOSIS — G90522 Complex regional pain syndrome I of left lower limb: Secondary | ICD-10-CM

## 2021-10-01 NOTE — Progress Notes (Signed)
Subjective:    Patient ID: Frederick [facility], male    DOB: July 09, 1961, 60 y.o.   MRN: [id number]  HPI He is here for consult concerning 2 issues.  He has a questionable lesion on his left jaw that he would like evaluated.  He states that this occurred after he had a syncopal episode and fell and hit that area in the past.  He also continues have difficulty with neck pain.  He is being cared for at [facility] in [place].  He states that they have done x-rays and CT scan and is also done an ablation, none of which have been successful.  He was told that there was nothing else that could be done.  He has not seen anyone locally other than at the pain clinic for this.   Review of Systems     Objective:   Physical Exam Alert and in no distress.  Exam of the left jaw shows no palpable tenderness along the bony surface of the jaw but there is a slight fullness to the skin in the mid jaw area.  Exam of the gums and mucosa was normal.       Assessment & Plan:   Cervical spondylosis without myelopathy  Complex regional pain syndrome type 1 of left lower extremity I explained that rather than starting over, it would be a better idea to find out what has been done in the past in terms of x-rays and scanning before repeating any work-up on him.  Message was left for Dr. Lamar Benes, [phone]

## 2021-10-02 ENCOUNTER — Telehealth: Payer: Self-pay

## 2021-10-02 NOTE — Telephone Encounter (Signed)
Pt was called to advise that the pain doctor wants him to come back . Pt will call back could not hear me h says he will call back. [place]

## 2021-10-03 ENCOUNTER — Telehealth: Payer: Self-pay

## 2021-10-03 NOTE — Telephone Encounter (Signed)
Pt. Called back stating that he has not heard back from [facility] yet. I gave him the message that he was supposed to get back in with pain management. He wanted to know why you would not refer him to an ortho doctor.

## 2021-10-04 DIAGNOSIS — G90522 Complex regional pain syndrome I of left lower limb: Secondary | ICD-10-CM | POA: Diagnosis not present

## 2021-10-04 DIAGNOSIS — G8929 Other chronic pain: Secondary | ICD-10-CM | POA: Diagnosis not present

## 2021-10-04 DIAGNOSIS — M545 Low back pain, unspecified: Secondary | ICD-10-CM | POA: Diagnosis not present

## 2021-10-04 DIAGNOSIS — M542 Cervicalgia: Secondary | ICD-10-CM | POA: Diagnosis not present

## 2021-10-04 DIAGNOSIS — M47812 Spondylosis without myelopathy or radiculopathy, cervical region: Secondary | ICD-10-CM | POA: Diagnosis not present

## 2021-10-04 DIAGNOSIS — M7062 Trochanteric bursitis, left hip: Secondary | ICD-10-CM | POA: Diagnosis not present

## 2021-10-04 DIAGNOSIS — M25561 Pain in right knee: Secondary | ICD-10-CM | POA: Diagnosis not present

## 2021-10-04 DIAGNOSIS — M25562 Pain in left knee: Secondary | ICD-10-CM | POA: Diagnosis not present

## 2021-10-05 DIAGNOSIS — M542 Cervicalgia: Secondary | ICD-10-CM | POA: Diagnosis not present

## 2021-10-11 DIAGNOSIS — M542 Cervicalgia: Secondary | ICD-10-CM | POA: Diagnosis not present

## 2021-10-15 DIAGNOSIS — M542 Cervicalgia: Secondary | ICD-10-CM | POA: Diagnosis not present

## 2021-10-17 NOTE — Progress Notes (Signed)
PATIENT: Frederick Payne DOB: 02-22-1961  REASON FOR VISIT: Follow up HISTORY FROM: Patient PRIMARY NEUROLOGIST: Dr. Terrace Arabia now that Dr. Anne Hahn is retired  HISTORY OF PRESENT ILLNESS: Today 10/18/21 Frederick Payne here today for follow-up with history of chronic left leg pain, spinal stimulator has been removed, vertigo, chronic bilateral tinnitus, history of syncope.  EEG was unremarkable.  CT head was unremarkable.  Completed vestibular rehab for the vertigo. The black outs have been going on for 1-2 years. Sees cardiology, loop recorder has not shown cardiac etiology.  On 10/12/21, got up to walk to the bathroom after watching TV, found himself on the floor of the bathroom, doesn't remember what happened, remembers felt like someone was sitting on him, BP was 100/62, denies diaphoresis, no incontinence. Spells for 1-2 years. Sometimes spells happen when sitting, waves comes over, will eventually pass out. Wasn't shaking all over, but husband reports "disconnected". Doesn't bite tongue. Doesn't think a seizure. No longer has vertigo since vestibular rehab. Has been cardiology, felt vasovagal syncope, knows bear down, but spells come on so fast, he doesn't have time. Spell is every few months, this month has had 2 so far. This month was the worst. Each time they happen, he is experiencing severe pain, on the 12/16, had severe neck pain. Saw orthopedist on his own to see what could be done about neck pain. Is doing PT now. Has to determine if heart valve is compatible with MRI and loop recorder.   HISTORY  06/20/2021 Dr. Anne Hahn: Frederick Payne is a 60 year old right-handed white male with a history of chronic left leg pain.  He just recently had a spinal stimulator placed in the low back.  He comes in to the office today for evaluation of several new problems.  The patient reports that 6 days ago he began having spontaneous onset of vertigo.  He got up in the morning around 4 AM to go the bathroom and had  severe dizziness and vertigo, he staggered but did not fall.  He had no nausea or vomiting.  He has bilateral tinnitus, but this is a chronic issue.  No change in hearing has been noted.  He reports no double vision, loss of vision, slurred speech, or numbness or weakness of the face, arms, or legs that is new or different.  At times, he may have some numbness in the arms when he wakes up in the morning.  He does have chronic neck pain but this has not increased recently.  He denies any significant headache but does have some sinus pressure at times in the frontal regions that has been present off and on for several years.  He reports a 17 pound weight loss over the last several months.  He was started on gabapentin in June 2022, but he has been on a stable dose over the last couple months.  The patient also reports a 1 to 2-year history of intermittent blackouts.  He has had 3 such episodes so far this year.  The last episode was 2 to 3 weeks ago.  He has been seen through cardiology, a loop recorder was placed and he was told that it was not related to any cardiac rhythm problem.  The patient reports that he has had 2 episodes of loss of consciousness while sitting.  The patient may feel lightheaded and gets diaphoretic.  He may have some nausea associated with events.  He is not sure but he feels that he may have some increasing  leg pain associated with these events.  The patient may look flushed in the face with the episodes, he may be unconscious for 1 to 2 minutes, he does not bite his tongue or lose control of the bowels or the bladder.  He comes to this office for further evaluation.  REVIEW OF SYSTEMS: Out of a complete 14 system review of symptoms, the patient complains only of the following symptoms, and all other reviewed systems are negative.  See HPI  ALLERGIES: Allergies  Allergen Reactions   Benzodiazepines Other (See Comments)    Causes pt to get violent    Contrast Media [Iodinated  Diagnostic Agents] Hives   Iodine Anaphylaxis   Other Anaphylaxis    FRUITS FROM TREES.    Pectin Anaphylaxis   Shellfish Allergy Anaphylaxis   Sulfa Antibiotics Anaphylaxis   Diazepam Other (See Comments)    Headache, strange behavior, memory loss   Lactose Intolerance (Gi) Nausea And Vomiting    HOME MEDICATIONS: Outpatient Medications Prior to Visit  Medication Sig Dispense Refill   albuterol (VENTOLIN HFA) 108 (90 Base) MCG/ACT inhaler INHALE 2 PUFFS BY MOUTH EVERY 4 TO 6 HOURS AS NEEDED 18 g 0   ARNUITY ELLIPTA 100 MCG/ACT AEPB INHALE 1 PUFF INTO THE LUNGS DAILY 30 each 11   ascorbic acid (VITAMIN C) 500 MG tablet Take 500 mg by mouth daily.     aspirin 81 MG tablet Take 81 mg by mouth daily.     COAGUCHEK LANCETS MISC 1 each by Does not apply route as directed. 200 each 1   DHA-PHOSPHATIDYLSERINE PO Take 500 mg by mouth.     FEROSUL 325 (65 Fe) MG tablet TAKE 1 TABLET BY MOUTH EVERY DAY 90 tablet 0   FERREX 150 150 MG capsule TAKE 1 CAPSULE BY MOUTH EVERY DAY 90 capsule 0   fish oil-omega-3 fatty acids 1000 MG capsule Take 1 g by mouth daily.     gabapentin (NEURONTIN) 300 MG capsule 1 capsule twice daily for 1 week, then take 1 in the morning and 2 in the evening 90 capsule 3   L-Methylfolate-Algae (DEPLIN 15) 15-90.314 MG CAPS Take by mouth.     Multiple Vitamins-Minerals (ONE-A-DAY MENS 50+ ADVANTAGE PO) Take by mouth daily.     OVER THE COUNTER MEDICATION      POLICOSANOL PO Take 20 mg by mouth daily.     PT/INR Test (COAGUCHEK PT TEST) STRP 1 each by In Vitro route as directed. 48 each 1   PT/INR Testing Monitor (COAGUCHEK XS PLUS SYSTEM) KIT 1 each by Does not apply route as directed. 1 kit 0   S-Adenosylmethionine (SAM-E) 400 MG TABS Take 1 tablet by mouth daily.     St Johns Wort 300 MG CAPS Take 300 mg by mouth 2 (two) times daily.     Thiamine HCl (B-1) 100 MG TABS Take by mouth.     warfarin (COUMADIN) 10 MG tablet TAKE 1 TABLET(10 MG) BY MOUTH DAILY 90 tablet 0    warfarin (COUMADIN) 2 MG tablet TAKE 1 TABLET BY MOUTH DAILY 30 tablet 2   warfarin (COUMADIN) 5 MG tablet TAKE 1 TABLET BY MOUTH DAILY 90 tablet 1   atorvastatin (LIPITOR) 20 MG tablet Take 1 tablet (20 mg total) by mouth daily. 90 tablet 3   No facility-administered medications prior to visit.    PAST MEDICAL HISTORY: Past Medical History:  Diagnosis Date   Aortic stenosis    Had surgery performed   CAD (coronary artery  disease)    Chronic anemia    Deep vein thrombosis (HCC) 2006   After a broken ankle--recently diagnosed with reflex sympathetic dystrophy of the left lower extremity   Elevated LFTs    h/o excessive alcohol   Factor V Leiden (HCC)    Hypertension    Ischemic heart disease    Psychiatric disorder    Underlying   PUD (peptic ulcer disease)    with bleeding   Reflex sympathetic dystrophy     PAST SURGICAL HISTORY: Past Surgical History:  Procedure Laterality Date   AORTIC VALVE REPLACEMENT  07/15/12   27 mm St Jude   CORONARY ARTERY BYPASS GRAFT     x1   GALLBLADDER SURGERY  2008   gastrectomy and vagotomy     greenfield filter      FAMILY HISTORY: Family History  Problem Relation Age of Onset   Healthy Father    Stroke Paternal Grandmother     SOCIAL HISTORY: Social History   Socioeconomic History   Marital status: Married    Spouse name: Not on file   Number of children: 2   Years of education: Not on file   Highest education level: Not on file  Occupational History   Occupation: finance-disabled  Tobacco Use   Smoking status: Former   Smokeless tobacco: Never   Tobacco comments:    quit 15 + yrs ago  Substance and Sexual Activity   Alcohol use: No    Alcohol/week: 0.0 standard drinks   Drug use: No   Sexual activity: Yes    Partners: Male  Other Topics Concern   Not on file  Social History Narrative   Lives at home w/ his partner, Susann Givens and his elderly mother   Right-handed   Drinks about 3 cups of tea per day   Social  Determinants of Health   Financial Resource Strain: Not on file  Food Insecurity: Not on file  Transportation Needs: Not on file  Physical Activity: Not on file  Stress: Not on file  Social Connections: Not on file  Intimate Partner Violence: Not on file   PHYSICAL EXAM  Vitals:   10/18/21 1259  Weight: 187 lb (84.8 kg)  Height: 5\' 8"  (1.727 m)   Body mass index is 28.43 kg/m. No data found.   Generalized: Well developed, in no acute distress  Neurological examination  Mentation: Alert oriented to time, place, history taking. Follows all commands speech and language fluent Cranial nerve II-XII: Pupils were equal round reactive to light. Extraocular movements were full, visual field were full on confrontational test. Facial sensation and strength were normal. Head turning and shoulder shrug  were normal and symmetric. Motor: The motor testing reveals 5 over 5 strength of all 4 extremities. Good symmetric motor tone is noted throughout.  Sensory: Sensory testing is intact to soft touch on all 4 extremities. No evidence of extinction is noted.  Coordination: Cerebellar testing reveals good finger-nose-finger and heel-to-shin bilaterally.  Gait and station: Gait is normal. Tandem gait is unsteady. Reflexes: Deep tendon reflexes are symmetric and normal bilaterally.   DIAGNOSTIC DATA (LABS, IMAGING, TESTING) - I reviewed patient records, labs, notes, testing and imaging myself where available.  Lab Results  Component Value Date   WBC 4.1 04/10/2021   HGB 15.7 04/10/2021   HCT 47.8 04/10/2021   MCV 87 04/10/2021   PLT 161 04/10/2021      Component Value Date/Time   NA 139 04/10/2021 0000   NA 143 02/14/2016  1518   K 4.6 04/10/2021 0000   K 4.3 02/14/2016 1518   CL 101 04/10/2021 0000   CO2 22 04/10/2021 0000   CO2 26 02/14/2016 1518   GLUCOSE 101 (H) 04/10/2021 0000   GLUCOSE 114 (H) 06/24/2016 1116   GLUCOSE 102 02/14/2016 1518   BUN 9 04/10/2021 0000   BUN 13.5  02/14/2016 1518   CREATININE 0.99 04/10/2021 0000   CREATININE 1.00 06/24/2016 1116   CREATININE 1.0 02/14/2016 1518   CALCIUM 9.7 04/10/2021 0000   CALCIUM 10.1 02/14/2016 1518   PROT 7.2 04/10/2021 0000   PROT 8.0 02/14/2016 1518   ALBUMIN 4.5 04/10/2021 0000   ALBUMIN 4.0 02/14/2016 1518   AST 22 04/10/2021 0000   AST 55 (H) 02/14/2016 1518   ALT 19 04/10/2021 0000   ALT 81 (H) 02/14/2016 1518   ALKPHOS 49 04/10/2021 0000   ALKPHOS 60 02/14/2016 1518   BILITOT 0.5 04/10/2021 0000   BILITOT 0.49 02/14/2016 1518   GFRNONAA 83 08/03/2019 1531   GFRAA 95 08/03/2019 1531   Lab Results  Component Value Date   CHOL 235 (H) 04/10/2021   HDL 38 (L) 04/10/2021   LDLCALC 164 (H) 04/10/2021   TRIG 181 (H) 04/10/2021   CHOLHDL 6.2 (H) 04/10/2021   No results found for: HGBA1C Lab Results  Component Value Date   VITAMINB12 >2000 (H) 04/01/2019   Lab Results  Component Value Date   TSH 2.10 04/29/2017    ASSESSMENT AND PLAN 60 y.o. year old male  has a past medical history of Aortic stenosis, CAD (coronary artery disease), Chronic anemia, Deep vein thrombosis (HCC) (2006), Elevated LFTs, Factor V Leiden (HCC), Hypertension, Ischemic heart disease, Psychiatric disorder, PUD (peptic ulcer disease), and Reflex sympathetic dystrophy. here with:  Chronic left leg pain  -Sees pain clinic, referred for outpatient PT  Positional Vertigo Recurrent Syncope, possible vasovagal syncope -Going on for 1-2 years, spells always occur during times of significant pain, sees pain management, but has been resistant to trying any oral pain medications, now seeing orthopedist, other options for pain management? -Seeing Cardiology has loop recorder, no cardiac etiology has been found -Not orthostatic today, encouraged to ensure stands slowly especially during times of pain, stay well hydrated  -EEG Aug 2022 was unremarkable -CT head was unremarkable Sept 2022  -Completed vestibular rehab, vertigo  much improved -Asked to document spells, and keep posted about status/plan, has had extensive work up at this point, will return in 4 months with Dr. Terrace Arabia who will be his new neurologist   Margie Ege, AGNP-C, DNP 10/18/2021, 3:19 PM [facility] [address] [place], [place] [address] [phone]

## 2021-10-18 ENCOUNTER — Encounter: Payer: Self-pay | Admitting: Neurology

## 2021-10-18 ENCOUNTER — Ambulatory Visit (INDEPENDENT_AMBULATORY_CARE_PROVIDER_SITE_OTHER): Payer: Medicare Other | Admitting: Neurology

## 2021-10-18 VITALS — Ht 68.0 in | Wt 187.0 lb

## 2021-10-18 DIAGNOSIS — G90522 Complex regional pain syndrome I of left lower limb: Secondary | ICD-10-CM | POA: Diagnosis not present

## 2021-10-18 DIAGNOSIS — R55 Syncope and collapse: Secondary | ICD-10-CM | POA: Diagnosis not present

## 2021-10-18 NOTE — Patient Instructions (Signed)
Continue to work with your orthopedist Keep track of spells  Consider working with your specialists to determine pain options available Be very careful when standing  See you back in 4 months with Dr. Krista Blue

## 2021-10-18 NOTE — Progress Notes (Signed)
Chart reviewed, agree above plan ?

## 2021-10-19 DIAGNOSIS — M542 Cervicalgia: Secondary | ICD-10-CM | POA: Diagnosis not present

## 2021-10-20 ENCOUNTER — Other Ambulatory Visit: Payer: Self-pay | Admitting: Family Medicine

## 2021-10-22 DIAGNOSIS — I483 Typical atrial flutter: Secondary | ICD-10-CM | POA: Diagnosis not present

## 2021-10-22 DIAGNOSIS — M542 Cervicalgia: Secondary | ICD-10-CM | POA: Diagnosis not present

## 2021-10-26 ENCOUNTER — Other Ambulatory Visit: Payer: Self-pay | Admitting: Orthopedic Surgery

## 2021-10-26 DIAGNOSIS — M542 Cervicalgia: Secondary | ICD-10-CM | POA: Diagnosis not present

## 2021-10-26 DIAGNOSIS — M5412 Radiculopathy, cervical region: Secondary | ICD-10-CM

## 2021-10-29 DIAGNOSIS — M542 Cervicalgia: Secondary | ICD-10-CM | POA: Diagnosis not present

## 2021-10-30 ENCOUNTER — Telehealth: Payer: Self-pay

## 2021-10-30 ENCOUNTER — Telehealth: Payer: Self-pay | Admitting: Family Medicine

## 2021-10-30 NOTE — Telephone Encounter (Signed)
Left message for patient to call back and schedule Medicare Annual Wellness Visit (AWV) either virtually or in office. I left my number for patient to call [phone].  Last AWV 02/16/20; please schedule at anytime with health coach  This should be a 45 minute visit.

## 2021-10-30 NOTE — Telephone Encounter (Signed)
Phone call to patient to review instructions for 13 hr prep for Myelogram CT w/ contrast on 10/30/21 at 1:00 PM. PT reports he was pre-medicated with benadryl in the past and done well with the CT Contrast. Prescription called into [facility]. Pt aware and verbalized understanding of instructions. Prescription: 10/31/21 0000- 50mg  Prednisone 10/31/21 0600- 50mg  Prednisone 10/31/21 1200 - 50mg  Prednisone and 50mg  Benadryl   Pt reports he has benadryl at home and does not need a prescription for this. Pt verbalized understanding of when and how much to take. Pt also verbalized understanding of needing a driver while taking benadryl as it may cause drowsiness.

## 2021-10-31 ENCOUNTER — Ambulatory Visit
Admission: RE | Admit: 2021-10-31 | Discharge: 2021-10-31 | Disposition: A | Discharge status: Home / Self Care | Payer: Medicare Other | Source: Ambulatory Visit | Attending: Orthopedic Surgery | Admitting: Orthopedic Surgery

## 2021-10-31 ENCOUNTER — Other Ambulatory Visit: Payer: Self-pay

## 2021-10-31 DIAGNOSIS — M4802 Spinal stenosis, cervical region: Secondary | ICD-10-CM | POA: Diagnosis not present

## 2021-10-31 DIAGNOSIS — M50121 Cervical disc disorder at C4-C5 level with radiculopathy: Secondary | ICD-10-CM | POA: Diagnosis not present

## 2021-10-31 DIAGNOSIS — M5412 Radiculopathy, cervical region: Secondary | ICD-10-CM

## 2021-10-31 DIAGNOSIS — M5031 Other cervical disc degeneration,  high cervical region: Secondary | ICD-10-CM | POA: Diagnosis not present

## 2021-10-31 MED ORDER — DIAZEPAM 5 MG PO TABS
5.0000 mg | ORAL_TABLET | Freq: Once | ORAL | Status: AC
  Administered 2021-10-31: 5 mg via ORAL

## 2021-10-31 MED ORDER — IOPAMIDOL (ISOVUE-M 300) INJECTION 61%
10.0000 mL | Freq: Once | INTRAMUSCULAR | Status: AC
  Administered 2021-10-31: 10 mL via INTRATHECAL

## 2021-10-31 MED ORDER — ONDANSETRON HCL 4 MG/2ML IJ SOLN
4.0000 mg | Freq: Once | INTRAMUSCULAR | Status: AC | PRN
  Administered 2021-10-31: 4 mg via INTRAMUSCULAR

## 2021-10-31 MED ORDER — MEPERIDINE HCL 50 MG/ML IJ SOLN
50.0000 mg | Freq: Once | INTRAMUSCULAR | Status: AC | PRN
  Administered 2021-10-31: 50 mg via INTRAMUSCULAR

## 2021-10-31 NOTE — Discharge Instr - Other Orders (Addendum)
1347: Pain 9/10 reported post myelogram procedure, see MAR.  14:19 Denies pain, complete relief noted.

## 2021-10-31 NOTE — Discharge Instructions (Signed)

## 2021-10-31 NOTE — Progress Notes (Signed)
Pt reports he completed his 13 hr prep for the CT myelogram.

## 2021-11-02 DIAGNOSIS — M542 Cervicalgia: Secondary | ICD-10-CM | POA: Diagnosis not present

## 2021-11-05 DIAGNOSIS — Z86718 Personal history of other venous thrombosis and embolism: Secondary | ICD-10-CM | POA: Diagnosis not present

## 2021-11-05 DIAGNOSIS — Z136 Encounter for screening for cardiovascular disorders: Secondary | ICD-10-CM | POA: Diagnosis not present

## 2021-11-05 DIAGNOSIS — Z7901 Long term (current) use of anticoagulants: Secondary | ICD-10-CM | POA: Diagnosis not present

## 2021-11-05 DIAGNOSIS — Z95828 Presence of other vascular implants and grafts: Secondary | ICD-10-CM | POA: Diagnosis not present

## 2021-11-05 DIAGNOSIS — M542 Cervicalgia: Secondary | ICD-10-CM | POA: Diagnosis not present

## 2021-11-05 DIAGNOSIS — Z85828 Personal history of other malignant neoplasm of skin: Secondary | ICD-10-CM | POA: Diagnosis not present

## 2021-11-05 DIAGNOSIS — M47812 Spondylosis without myelopathy or radiculopathy, cervical region: Secondary | ICD-10-CM | POA: Diagnosis not present

## 2021-11-05 DIAGNOSIS — G894 Chronic pain syndrome: Secondary | ICD-10-CM | POA: Diagnosis not present

## 2021-11-05 DIAGNOSIS — Z0000 Encounter for general adult medical examination without abnormal findings: Secondary | ICD-10-CM | POA: Diagnosis not present

## 2021-11-05 DIAGNOSIS — Z1159 Encounter for screening for other viral diseases: Secondary | ICD-10-CM | POA: Diagnosis not present

## 2021-11-05 DIAGNOSIS — D6851 Activated protein C resistance: Secondary | ICD-10-CM | POA: Diagnosis not present

## 2021-11-05 DIAGNOSIS — Z1211 Encounter for screening for malignant neoplasm of colon: Secondary | ICD-10-CM | POA: Diagnosis not present

## 2021-11-05 DIAGNOSIS — Z125 Encounter for screening for malignant neoplasm of prostate: Secondary | ICD-10-CM | POA: Diagnosis not present

## 2021-11-06 ENCOUNTER — Encounter: Payer: Self-pay | Admitting: Neurology

## 2021-11-09 ENCOUNTER — Other Ambulatory Visit: Payer: Self-pay | Admitting: Family Medicine

## 2021-11-09 DIAGNOSIS — M542 Cervicalgia: Secondary | ICD-10-CM | POA: Diagnosis not present

## 2021-11-12 DIAGNOSIS — M47812 Spondylosis without myelopathy or radiculopathy, cervical region: Secondary | ICD-10-CM | POA: Diagnosis not present

## 2021-11-16 DIAGNOSIS — M542 Cervicalgia: Secondary | ICD-10-CM | POA: Diagnosis not present

## 2021-11-18 ENCOUNTER — Other Ambulatory Visit: Payer: Self-pay | Admitting: Family Medicine

## 2021-11-18 DIAGNOSIS — Z7901 Long term (current) use of anticoagulants: Secondary | ICD-10-CM

## 2021-11-18 DIAGNOSIS — D6851 Activated protein C resistance: Secondary | ICD-10-CM

## 2021-11-19 DIAGNOSIS — M542 Cervicalgia: Secondary | ICD-10-CM | POA: Diagnosis not present

## 2021-11-20 ENCOUNTER — Telehealth: Payer: Self-pay | Admitting: Family Medicine

## 2021-11-20 DIAGNOSIS — H16223 Keratoconjunctivitis sicca, not specified as Sjogren's, bilateral: Secondary | ICD-10-CM | POA: Diagnosis not present

## 2021-11-20 DIAGNOSIS — S0230XD Fracture of orbital floor, unspecified side, subsequent encounter for fracture with routine healing: Secondary | ICD-10-CM | POA: Diagnosis not present

## 2021-11-20 DIAGNOSIS — H2513 Age-related nuclear cataract, bilateral: Secondary | ICD-10-CM | POA: Diagnosis not present

## 2021-11-20 NOTE — Telephone Encounter (Signed)
Left message for patient to call back and schedule Medicare Annual Wellness Visit (AWV) either virtually or in office. I left my number for patient to call [phone].  Last AWV 02/16/20  please schedule at anytime with health coach  This should be a 45 minute visit.    Please confirm pt is still patient @ [facility]  last appt 10/01/21 but no pcp on epic

## 2021-11-23 DIAGNOSIS — M542 Cervicalgia: Secondary | ICD-10-CM | POA: Diagnosis not present

## 2021-11-23 DIAGNOSIS — M25551 Pain in right hip: Secondary | ICD-10-CM | POA: Diagnosis not present

## 2021-11-23 DIAGNOSIS — M25552 Pain in left hip: Secondary | ICD-10-CM | POA: Diagnosis not present

## 2021-11-26 DIAGNOSIS — M545 Low back pain, unspecified: Secondary | ICD-10-CM | POA: Diagnosis not present

## 2021-11-26 DIAGNOSIS — I483 Typical atrial flutter: Secondary | ICD-10-CM | POA: Diagnosis not present

## 2021-11-26 DIAGNOSIS — R2689 Other abnormalities of gait and mobility: Secondary | ICD-10-CM | POA: Diagnosis not present

## 2021-11-26 DIAGNOSIS — M25552 Pain in left hip: Secondary | ICD-10-CM | POA: Diagnosis not present

## 2021-11-26 DIAGNOSIS — M25551 Pain in right hip: Secondary | ICD-10-CM | POA: Diagnosis not present

## 2021-11-28 DIAGNOSIS — M542 Cervicalgia: Secondary | ICD-10-CM | POA: Diagnosis not present

## 2021-11-30 DIAGNOSIS — M542 Cervicalgia: Secondary | ICD-10-CM | POA: Diagnosis not present

## 2021-12-07 DIAGNOSIS — M25551 Pain in right hip: Secondary | ICD-10-CM | POA: Diagnosis not present

## 2021-12-07 DIAGNOSIS — M25552 Pain in left hip: Secondary | ICD-10-CM | POA: Diagnosis not present

## 2021-12-07 DIAGNOSIS — R2689 Other abnormalities of gait and mobility: Secondary | ICD-10-CM | POA: Diagnosis not present

## 2021-12-07 DIAGNOSIS — M545 Low back pain, unspecified: Secondary | ICD-10-CM | POA: Diagnosis not present

## 2021-12-12 DIAGNOSIS — M545 Low back pain, unspecified: Secondary | ICD-10-CM | POA: Diagnosis not present

## 2021-12-12 DIAGNOSIS — M25551 Pain in right hip: Secondary | ICD-10-CM | POA: Diagnosis not present

## 2021-12-12 DIAGNOSIS — M25552 Pain in left hip: Secondary | ICD-10-CM | POA: Diagnosis not present

## 2021-12-12 DIAGNOSIS — R2689 Other abnormalities of gait and mobility: Secondary | ICD-10-CM | POA: Diagnosis not present

## 2021-12-13 DIAGNOSIS — M47812 Spondylosis without myelopathy or radiculopathy, cervical region: Secondary | ICD-10-CM | POA: Diagnosis not present

## 2021-12-14 DIAGNOSIS — M542 Cervicalgia: Secondary | ICD-10-CM | POA: Diagnosis not present

## 2021-12-17 DIAGNOSIS — M25551 Pain in right hip: Secondary | ICD-10-CM | POA: Diagnosis not present

## 2021-12-17 DIAGNOSIS — R2689 Other abnormalities of gait and mobility: Secondary | ICD-10-CM | POA: Diagnosis not present

## 2021-12-17 DIAGNOSIS — M25552 Pain in left hip: Secondary | ICD-10-CM | POA: Diagnosis not present

## 2021-12-17 DIAGNOSIS — M545 Low back pain, unspecified: Secondary | ICD-10-CM | POA: Diagnosis not present

## 2021-12-19 DIAGNOSIS — M542 Cervicalgia: Secondary | ICD-10-CM | POA: Diagnosis not present

## 2021-12-20 ENCOUNTER — Telehealth: Payer: Self-pay | Admitting: Family Medicine

## 2021-12-20 NOTE — Telephone Encounter (Signed)
Left message for patient to call back and schedule Medicare Annual Wellness Visit (AWV) either virtually or in office. I left my number for patient to call [phone].  Last AWV  02/16/20 ; please schedule at anytime with health coach  This should be a 45 minute visit.

## 2021-12-21 DIAGNOSIS — M542 Cervicalgia: Secondary | ICD-10-CM | POA: Diagnosis not present

## 2021-12-24 DIAGNOSIS — M542 Cervicalgia: Secondary | ICD-10-CM | POA: Diagnosis not present

## 2021-12-26 DIAGNOSIS — M542 Cervicalgia: Secondary | ICD-10-CM | POA: Diagnosis not present

## 2021-12-27 DIAGNOSIS — M47812 Spondylosis without myelopathy or radiculopathy, cervical region: Secondary | ICD-10-CM | POA: Diagnosis not present

## 2021-12-28 DIAGNOSIS — R2689 Other abnormalities of gait and mobility: Secondary | ICD-10-CM | POA: Diagnosis not present

## 2021-12-28 DIAGNOSIS — M25552 Pain in left hip: Secondary | ICD-10-CM | POA: Diagnosis not present

## 2021-12-28 DIAGNOSIS — M545 Low back pain, unspecified: Secondary | ICD-10-CM | POA: Diagnosis not present

## 2021-12-28 DIAGNOSIS — M25551 Pain in right hip: Secondary | ICD-10-CM | POA: Diagnosis not present

## 2021-12-30 DIAGNOSIS — I483 Typical atrial flutter: Secondary | ICD-10-CM | POA: Diagnosis not present

## 2021-12-31 DIAGNOSIS — M542 Cervicalgia: Secondary | ICD-10-CM | POA: Diagnosis not present

## 2022-01-02 DIAGNOSIS — M542 Cervicalgia: Secondary | ICD-10-CM | POA: Diagnosis not present

## 2022-01-02 DIAGNOSIS — M7061 Trochanteric bursitis, right hip: Secondary | ICD-10-CM | POA: Diagnosis not present

## 2022-01-02 DIAGNOSIS — M7062 Trochanteric bursitis, left hip: Secondary | ICD-10-CM | POA: Diagnosis not present

## 2022-01-04 DIAGNOSIS — M545 Low back pain, unspecified: Secondary | ICD-10-CM | POA: Diagnosis not present

## 2022-01-04 DIAGNOSIS — R2689 Other abnormalities of gait and mobility: Secondary | ICD-10-CM | POA: Diagnosis not present

## 2022-01-04 DIAGNOSIS — M25552 Pain in left hip: Secondary | ICD-10-CM | POA: Diagnosis not present

## 2022-01-04 DIAGNOSIS — M25551 Pain in right hip: Secondary | ICD-10-CM | POA: Diagnosis not present

## 2022-01-07 DIAGNOSIS — M542 Cervicalgia: Secondary | ICD-10-CM | POA: Diagnosis not present

## 2022-01-08 ENCOUNTER — Telehealth: Payer: Self-pay | Admitting: Family Medicine

## 2022-01-08 DIAGNOSIS — M47812 Spondylosis without myelopathy or radiculopathy, cervical region: Secondary | ICD-10-CM | POA: Diagnosis not present

## 2022-01-08 NOTE — Telephone Encounter (Signed)
Left message for patient to call back and schedule Medicare Annual Wellness Visit (AWV) either virtually or in office. ?I left my number for patient to call [phone]. ? ?Last AWV 02/16/20 ?please schedule at anytime with health coach ? ?This should be a 45 minute visit.   ?

## 2022-01-09 DIAGNOSIS — M542 Cervicalgia: Secondary | ICD-10-CM | POA: Diagnosis not present

## 2022-01-11 ENCOUNTER — Other Ambulatory Visit: Payer: Self-pay | Admitting: Family Medicine

## 2022-01-11 DIAGNOSIS — M25552 Pain in left hip: Secondary | ICD-10-CM | POA: Diagnosis not present

## 2022-01-11 DIAGNOSIS — M25551 Pain in right hip: Secondary | ICD-10-CM | POA: Diagnosis not present

## 2022-01-11 DIAGNOSIS — R2689 Other abnormalities of gait and mobility: Secondary | ICD-10-CM | POA: Diagnosis not present

## 2022-01-11 DIAGNOSIS — M545 Low back pain, unspecified: Secondary | ICD-10-CM | POA: Diagnosis not present

## 2022-01-12 DIAGNOSIS — M545 Low back pain, unspecified: Secondary | ICD-10-CM | POA: Diagnosis not present

## 2022-01-14 DIAGNOSIS — R350 Frequency of micturition: Secondary | ICD-10-CM | POA: Diagnosis not present

## 2022-01-14 DIAGNOSIS — N400 Enlarged prostate without lower urinary tract symptoms: Secondary | ICD-10-CM | POA: Diagnosis not present

## 2022-01-14 NOTE — Telephone Encounter (Signed)
Patient has found a new pcp closer to home and no longer comes to [place] ?

## 2022-01-15 DIAGNOSIS — M542 Cervicalgia: Secondary | ICD-10-CM | POA: Diagnosis not present

## 2022-01-16 DIAGNOSIS — L738 Other specified follicular disorders: Secondary | ICD-10-CM | POA: Diagnosis not present

## 2022-01-16 DIAGNOSIS — D170 Benign lipomatous neoplasm of skin and subcutaneous tissue of head, face and neck: Secondary | ICD-10-CM | POA: Diagnosis not present

## 2022-01-16 DIAGNOSIS — D225 Melanocytic nevi of trunk: Secondary | ICD-10-CM | POA: Diagnosis not present

## 2022-01-16 DIAGNOSIS — Z86008 Personal history of in-situ neoplasm of other site: Secondary | ICD-10-CM | POA: Diagnosis not present

## 2022-01-18 DIAGNOSIS — M25552 Pain in left hip: Secondary | ICD-10-CM | POA: Diagnosis not present

## 2022-01-18 DIAGNOSIS — M545 Low back pain, unspecified: Secondary | ICD-10-CM | POA: Diagnosis not present

## 2022-01-18 DIAGNOSIS — R2689 Other abnormalities of gait and mobility: Secondary | ICD-10-CM | POA: Diagnosis not present

## 2022-01-18 DIAGNOSIS — M25551 Pain in right hip: Secondary | ICD-10-CM | POA: Diagnosis not present

## 2022-01-21 DIAGNOSIS — M47816 Spondylosis without myelopathy or radiculopathy, lumbar region: Secondary | ICD-10-CM | POA: Diagnosis not present

## 2022-02-04 DIAGNOSIS — I483 Typical atrial flutter: Secondary | ICD-10-CM | POA: Diagnosis not present

## 2022-02-15 DIAGNOSIS — M533 Sacrococcygeal disorders, not elsewhere classified: Secondary | ICD-10-CM | POA: Diagnosis not present

## 2022-02-18 DIAGNOSIS — M542 Cervicalgia: Secondary | ICD-10-CM | POA: Diagnosis not present

## 2022-02-19 ENCOUNTER — Ambulatory Visit (INDEPENDENT_AMBULATORY_CARE_PROVIDER_SITE_OTHER): Payer: Medicare Other | Admitting: Neurology

## 2022-02-19 ENCOUNTER — Encounter: Payer: Self-pay | Admitting: Neurology

## 2022-02-19 VITALS — BP 144/92 | HR 80 | Wt 186.0 lb

## 2022-02-19 DIAGNOSIS — Z7901 Long term (current) use of anticoagulants: Secondary | ICD-10-CM

## 2022-02-19 DIAGNOSIS — H8110 Benign paroxysmal vertigo, unspecified ear: Secondary | ICD-10-CM | POA: Diagnosis not present

## 2022-02-19 DIAGNOSIS — R55 Syncope and collapse: Secondary | ICD-10-CM

## 2022-02-19 DIAGNOSIS — Z85828 Personal history of other malignant neoplasm of skin: Secondary | ICD-10-CM | POA: Insufficient documentation

## 2022-02-19 MED ORDER — DULOXETINE HCL 60 MG PO CPEP: 60.0000 mg | ORAL_CAPSULE | Freq: Every day | ORAL | 6 refills | Status: DC

## 2022-02-19 NOTE — Progress Notes (Signed)
? ?Chief Complaint  ?Patient presents with  ? Follow-up  ?  Rm 12. Alone. ?Pt states he is not having as many synopical episodes. Last incident was 01/10/2022. States physical therapy is helping, reducing pain.  ? ? ? ? ?ASSESSMENT AND PLAN ? ?Frederick Payne is a 61 y.o. male   ?Benign positional vertigo in August 2022 ? Much improved with repositioning maneuver ? ?Fainting spells ? With documented hypotension, bradycardia, no longer have recurrent spells, ? CT head without contrast was normal ? EEG was normal ? Cardiac work-up was nonrevealing ? ?Chronic neck pain, low back pain, reported recent sacral fracture ? Is under pain management, will continue follow-up with them ? ?Chronic left lower extremity pain ? Reported related to his previous multiple left lower extremity DVT, multiple PEs, status post IV filter, factor V Leyden deficiency, on chronic Coumadin treatment ? Will try Cymbalta 60 mg daily ? ? He will continue follow-up with his primary care, pain management, only return to clinic for new issues ?  ? ?DIAGNOSTIC DATA (LABS, IMAGING, TESTING) ?- I reviewed patient records, labs, notes, testing and imaging myself where available. ? ? ?MEDICAL HISTORY: ? ?Frederick Payne is a patient of Dr. Jannifer Franklin, following up for chronic low back pain, left leg pain ? ?I reviewed and summarized the referring note.  Past medical history ?Coronary artery disease, status post CABG ?Status post mechanical AVR ?Alcoholic liver disease ?Previous atrial flutter on Coumadin, ?Factor V Leyden deficiency, history of DVT after ankle fracture ?IVC filter in place due to 3 separate episode of PEs, ? ?He has been under the care of cardiologist, had a history of significant palpitation accompanied by passing out episode, episode happened while watching TV sitting in the chair, all occasionally in standing position, he feels nausea, loss of hearing, sweaty, likely strained from his body before transient loss of consciousness, there  witnessed staring spells, but no seizure-like activity. ? ?There was also documented low blood pressures 70/47 heart rate less than 40 prior to passing out the episode. ? ?He had extensive cardiac work-up, event monitoring showed no significant abnormality, echocardiogram was satisfactory, carotid Doppler study was negative, loop recorder has not shown cardiac etiology ? ?He no longer have recurrent passing out spells ? ?In August 22, he had sudden onset vertigo, difficulty walking, bilateral chronic tinnitus, he was diagnosed with benign positional vertigo ? ?Had a CT head September 2022 that was normal ? ?Personally reviewed CT cervical myelogram January 2023, slight reversal of normal cervical spine lordosis, multilevel degenerative changes, most advanced at C5-6, mild canal stenosis, severe bilateral neuroforaminal stenosis, C6-7, severe right and moderate left foraminal stenosis ? ?He underwent vestibular rehabilitation, made marked progress, overall feeling very well, he suffered a tailbone fracture few weeks ago, complains significant pain, difficulty finding a comfortable sitting position ? ? ? ?PHYSICAL EXAM: ?  ?Vitals:  ? 02/19/22 1323  ?BP: (!) 144/92  ?Pulse: 80  ?Weight: 186 lb (84.4 kg)  ? ?Not recorded ?  ? ? ?Body mass index is 28.28 kg/m?. ? ?PHYSICAL EXAMNIATION: ? ?Gen: NAD, conversant, well nourised, well groomed                     ?Cardiovascular: Regular rate rhythm, no peripheral edema, warm, nontender. ?Eyes: Conjunctivae clear without exudates or hemorrhage ?Neck: Supple, no carotid bruits. ?Pulmonary: Clear to auscultation bilaterally  ? ?NEUROLOGICAL EXAM: ? ?MENTAL STATUS: ?Speech/cognition: ?Awake, alert, oriented to history taking care of conversation, in mild distress ?  ?  CRANIAL NERVES: ?CN II: Visual fields are full to confrontation. Pupils are round equal and briskly reactive to light. ?CN III, IV, VI: extraocular movement are normal. No ptosis. ?CN V: Facial sensation is intact  to light touch ?CN VII: Face is symmetric with normal eye closure  ?CN VIII: Hearing is normal to causal conversation. ?CN IX, X: Phonation is normal. ?CN XI: Head turning and shoulder shrug are intact ? ?MOTOR: ?There is no pronator drift of out-stretched arms. Muscle bulk and tone are normal. Muscle strength is normal. ? ?REFLEXES: ?Reflexes are 1 and symmetric at the biceps, triceps, knees, and ankles. Plantar responses are flexor. ? ?SENSORY: ?Intact to light touch, pinprick and vibratory sensation are intact in fingers and toes. ? ?COORDINATION: ?There is no trunk or limb dysmetria noted. ? ?GAIT/STANCE: Need to push-up to get up from seated position, cautious, antalgic ? ?REVIEW OF SYSTEMS:  ?Full 14 system review of systems performed and notable only for as above ?All other review of systems were negative. ? ? ?ALLERGIES: ?Allergies  ?Allergen Reactions  ? Benzodiazepines Other (See Comments)  ?  Causes pt to get violent   ? Contrast Media [Iodinated Contrast Media] Hives  ? Iodine Anaphylaxis  ? Other Anaphylaxis  ?  FRUITS FROM TREES.   ? Pectin Anaphylaxis  ? Shellfish Allergy Anaphylaxis  ? Sulfa Antibiotics Anaphylaxis  ? Diazepam Other (See Comments)  ?  Headache, strange behavior, memory loss  ? Lactose Intolerance (Gi) Nausea And Vomiting  ? ? ?HOME MEDICATIONS: ?Current Outpatient Medications  ?Medication Sig Dispense Refill  ? albuterol (VENTOLIN HFA) 108 (90 Base) MCG/ACT inhaler INHALE 2 PUFFS BY MOUTH EVERY 4 TO 6 HOURS AS NEEDED 18 g 0  ? ARNUITY ELLIPTA 100 MCG/ACT AEPB INHALE 1 PUFF INTO THE LUNGS DAILY 30 each 11  ? ascorbic acid (VITAMIN C) 500 MG tablet Take 500 mg by mouth daily.    ? aspirin 81 MG tablet Take 81 mg by mouth daily.    ? COAGUCHEK LANCETS MISC 1 each by Does not apply route as directed. 200 each 1  ? DHA-PHOSPHATIDYLSERINE PO Take 500 mg by mouth.    ? DULoxetine (CYMBALTA) 60 MG capsule Take 1 capsule (60 mg total) by mouth daily. 30 capsule 6  ? FEROSUL 325 (65 Fe) MG  tablet TAKE 1 TABLET BY MOUTH EVERY DAY 90 tablet 0  ? fish oil-omega-3 fatty acids 1000 MG capsule Take 1 g by mouth daily.    ? iron polysaccharides (NIFEREX) 150 MG capsule TAKE 1 CAPSULE BY MOUTH EVERY DAY 30 capsule 0  ? L-Methylfolate-Algae (DEPLIN 15) 15-90.314 MG CAPS Take by mouth.    ? Multiple Vitamins-Minerals (ONE-A-DAY MENS 50+ ADVANTAGE PO) Take by mouth daily.    ? OVER THE COUNTER MEDICATION     ? PT/INR Test (COAGUCHEK PT TEST) STRP 1 each by In Vitro route as directed. 48 each 1  ? PT/INR Testing Monitor (COAGUCHEK XS PLUS SYSTEM) KIT 1 each by Does not apply route as directed. 1 kit 0  ? S-Adenosylmethionine (SAM-E) 400 MG TABS Take 1 tablet by mouth daily.    ? St Johns Wort 300 MG CAPS Take 300 mg by mouth 2 (two) times daily.    ? Thiamine HCl (B-1) 100 MG TABS Take by mouth.    ? warfarin (COUMADIN) 10 MG tablet TAKE 1 TABLET(10 MG) BY MOUTH DAILY 90 tablet 0  ? warfarin (COUMADIN) 2 MG tablet TAKE 1 TABLET BY MOUTH DAILY 30 tablet 2  ?  warfarin (COUMADIN) 5 MG tablet TAKE 1 TABLET BY MOUTH DAILY 90 tablet 1  ? ?No current facility-administered medications for this visit.  ? ? ?PAST MEDICAL HISTORY: ?Past Medical History:  ?Diagnosis Date  ? Aortic stenosis   ? Had surgery performed  ? CAD (coronary artery disease)   ? Chronic anemia   ? Deep vein thrombosis ([place]) 2006  ? After a broken ankle--recently diagnosed with reflex sympathetic dystrophy of the left lower extremity  ? Elevated LFTs   ? h/o excessive alcohol  ? Factor V Leiden ([place])   ? Hypertension   ? Ischemic heart disease   ? Psychiatric disorder   ? Underlying  ? PUD (peptic ulcer disease)   ? with bleeding  ? Reflex sympathetic dystrophy   ? ? ?PAST SURGICAL HISTORY: ?Past Surgical History:  ?Procedure Laterality Date  ? AORTIC VALVE REPLACEMENT  07/15/12  ? 27 mm St Jude  ? CORONARY ARTERY BYPASS GRAFT    ? x1  ? GALLBLADDER SURGERY  2008  ? gastrectomy and vagotomy    ? greenfield filter    ? ? ?FAMILY HISTORY: ?Family History   ?Problem Relation Age of Onset  ? Healthy Father   ? Stroke Paternal Grandmother   ? ? ?SOCIAL HISTORY: ?Social History  ? ?Socioeconomic History  ? Marital status: Married  ?  Spouse name: Not on file  ? Num

## 2022-02-25 DIAGNOSIS — M542 Cervicalgia: Secondary | ICD-10-CM | POA: Diagnosis not present

## 2022-02-26 DIAGNOSIS — M47816 Spondylosis without myelopathy or radiculopathy, lumbar region: Secondary | ICD-10-CM | POA: Diagnosis not present

## 2022-02-28 DIAGNOSIS — I483 Typical atrial flutter: Secondary | ICD-10-CM | POA: Diagnosis not present

## 2022-02-28 DIAGNOSIS — R55 Syncope and collapse: Secondary | ICD-10-CM | POA: Diagnosis not present

## 2022-02-28 DIAGNOSIS — I2510 Atherosclerotic heart disease of native coronary artery without angina pectoris: Secondary | ICD-10-CM | POA: Diagnosis not present

## 2022-02-28 DIAGNOSIS — D682 Hereditary deficiency of other clotting factors: Secondary | ICD-10-CM | POA: Diagnosis not present

## 2022-02-28 DIAGNOSIS — Z951 Presence of aortocoronary bypass graft: Secondary | ICD-10-CM | POA: Diagnosis not present

## 2022-02-28 DIAGNOSIS — I4891 Unspecified atrial fibrillation: Secondary | ICD-10-CM | POA: Diagnosis not present

## 2022-02-28 DIAGNOSIS — Z954 Presence of other heart-valve replacement: Secondary | ICD-10-CM | POA: Diagnosis not present

## 2022-02-28 DIAGNOSIS — Z9889 Other specified postprocedural states: Secondary | ICD-10-CM | POA: Diagnosis not present

## 2022-02-28 DIAGNOSIS — I4892 Unspecified atrial flutter: Secondary | ICD-10-CM | POA: Diagnosis not present

## 2022-03-04 DIAGNOSIS — M542 Cervicalgia: Secondary | ICD-10-CM | POA: Diagnosis not present

## 2022-03-07 DIAGNOSIS — M47812 Spondylosis without myelopathy or radiculopathy, cervical region: Secondary | ICD-10-CM | POA: Diagnosis not present

## 2022-03-10 ENCOUNTER — Other Ambulatory Visit: Payer: Self-pay | Admitting: Medical

## 2022-03-11 DIAGNOSIS — I4892 Unspecified atrial flutter: Secondary | ICD-10-CM | POA: Diagnosis not present

## 2022-03-11 DIAGNOSIS — M47816 Spondylosis without myelopathy or radiculopathy, lumbar region: Secondary | ICD-10-CM | POA: Diagnosis not present

## 2022-03-11 DIAGNOSIS — I4891 Unspecified atrial fibrillation: Secondary | ICD-10-CM | POA: Diagnosis not present

## 2022-03-15 DIAGNOSIS — M5451 Vertebrogenic low back pain: Secondary | ICD-10-CM | POA: Diagnosis not present

## 2022-03-15 DIAGNOSIS — M4696 Unspecified inflammatory spondylopathy, lumbar region: Secondary | ICD-10-CM | POA: Diagnosis not present

## 2022-03-21 DIAGNOSIS — M47812 Spondylosis without myelopathy or radiculopathy, cervical region: Secondary | ICD-10-CM | POA: Diagnosis not present

## 2022-03-29 DIAGNOSIS — M2242 Chondromalacia patellae, left knee: Secondary | ICD-10-CM | POA: Diagnosis not present

## 2022-03-29 DIAGNOSIS — M2241 Chondromalacia patellae, right knee: Secondary | ICD-10-CM | POA: Diagnosis not present

## 2022-04-01 ENCOUNTER — Encounter: Payer: Self-pay | Admitting: Neurology

## 2022-04-01 DIAGNOSIS — I4891 Unspecified atrial fibrillation: Secondary | ICD-10-CM | POA: Diagnosis not present

## 2022-04-01 DIAGNOSIS — I483 Typical atrial flutter: Secondary | ICD-10-CM | POA: Diagnosis not present

## 2022-04-01 DIAGNOSIS — D682 Hereditary deficiency of other clotting factors: Secondary | ICD-10-CM | POA: Diagnosis not present

## 2022-04-01 DIAGNOSIS — Z954 Presence of other heart-valve replacement: Secondary | ICD-10-CM | POA: Diagnosis not present

## 2022-04-01 DIAGNOSIS — Z9889 Other specified postprocedural states: Secondary | ICD-10-CM | POA: Diagnosis not present

## 2022-04-01 DIAGNOSIS — I2510 Atherosclerotic heart disease of native coronary artery without angina pectoris: Secondary | ICD-10-CM | POA: Diagnosis not present

## 2022-04-01 DIAGNOSIS — Z951 Presence of aortocoronary bypass graft: Secondary | ICD-10-CM | POA: Diagnosis not present

## 2022-04-01 DIAGNOSIS — I4892 Unspecified atrial flutter: Secondary | ICD-10-CM | POA: Diagnosis not present

## 2022-04-01 DIAGNOSIS — R55 Syncope and collapse: Secondary | ICD-10-CM | POA: Diagnosis not present

## 2022-04-02 NOTE — Telephone Encounter (Signed)
Give patient follow-up with nurse practitioner,  If needed may consider prolonged outpatient EEG monitoring,

## 2022-04-02 NOTE — Telephone Encounter (Signed)
I spoke to the patient. States this is the first time he has experienced two episodes in one day. Offered appt 04/03/22 but he had a conflict. He accepted visit on 04/11/22.

## 2022-04-03 DIAGNOSIS — N401 Enlarged prostate with lower urinary tract symptoms: Secondary | ICD-10-CM | POA: Diagnosis not present

## 2022-04-03 DIAGNOSIS — R3912 Poor urinary stream: Secondary | ICD-10-CM | POA: Diagnosis not present

## 2022-04-04 DIAGNOSIS — M542 Cervicalgia: Secondary | ICD-10-CM | POA: Diagnosis not present

## 2022-04-05 ENCOUNTER — Other Ambulatory Visit: Admission: RE | Admit: 2022-04-05 | Payer: Medicare Other | Source: Ambulatory Visit

## 2022-04-08 DIAGNOSIS — E785 Hyperlipidemia, unspecified: Secondary | ICD-10-CM | POA: Diagnosis not present

## 2022-04-08 DIAGNOSIS — Z8679 Personal history of other diseases of the circulatory system: Secondary | ICD-10-CM | POA: Diagnosis not present

## 2022-04-09 DIAGNOSIS — M47816 Spondylosis without myelopathy or radiculopathy, lumbar region: Secondary | ICD-10-CM | POA: Diagnosis not present

## 2022-04-11 ENCOUNTER — Telehealth: Payer: Self-pay | Admitting: Neurology

## 2022-04-11 ENCOUNTER — Encounter: Payer: Self-pay | Admitting: Neurology

## 2022-04-11 ENCOUNTER — Ambulatory Visit (INDEPENDENT_AMBULATORY_CARE_PROVIDER_SITE_OTHER): Payer: Medicare Other | Admitting: Neurology

## 2022-04-11 VITALS — BP 139/84 | HR 69 | Ht 68.0 in | Wt 188.0 lb

## 2022-04-11 DIAGNOSIS — G90522 Complex regional pain syndrome I of left lower limb: Secondary | ICD-10-CM | POA: Diagnosis not present

## 2022-04-11 DIAGNOSIS — R55 Syncope and collapse: Secondary | ICD-10-CM

## 2022-04-11 NOTE — Patient Instructions (Signed)
Will check 48 hr EEG Contact cardiology about HR See you back in 4 months

## 2022-04-11 NOTE — Telephone Encounter (Signed)
I am ordered 48 hr ambulatory video monitor EEG, paperwork completed, placed in the POD.

## 2022-04-11 NOTE — Telephone Encounter (Addendum)
I have completed the pw and attached insurance cards. Fax sent to # [phone] confirmation received.

## 2022-04-11 NOTE — Progress Notes (Signed)
PATIENT: Frederick Payne DOB: 1961/06/28  REASON FOR VISIT: Follow up HISTORY FROM: Patient PRIMARY NEUROLOGIST: Dr. Terrace Arabia now that Dr. Anne Hahn is retired  Assessment and plan  1.  Benign positional vertigo in August 2022 2.  Fainting spells 3.  Chronic neck pain, low back pain, chronic left lower extremity pain -Check 48-hour ambulatory video monitor EEG, 2 recent spells in setting of severe pain potentially vasovagal? -Of note, witnessed generalized seizure in 2017 felt to be related to hyponatremia -Cardiac evaluation has been unrevealing, has loop recorder -CT head was normal September 2022 -EEG was normal August 2022 -Completed vestibular rehab with excellent improvement -Encouraged to reach out to cardiology, has noted frequent alerts on his phone heart rate in the 40s since increase of metoprolol  -Document spells, follow-up 4 months  HISTORY OF PRESENT ILLNESS: 06/20/2021 Dr. Anne Hahn: Frederick Payne is a 61 year old right-handed white male with a history of chronic left leg pain.  He just recently had a spinal stimulator placed in the low back.  He comes in to the office today for evaluation of several new problems.  The patient reports that 6 days ago he began having spontaneous onset of vertigo.  He got up in the morning around 4 AM to go the bathroom and had severe dizziness and vertigo, he staggered but did not fall.  He had no nausea or vomiting.  He has bilateral tinnitus, but this is a chronic issue.  No change in hearing has been noted.  He reports no double vision, loss of vision, slurred speech, or numbness or weakness of the face, arms, or legs that is new or different.  At times, he may have some numbness in the arms when he wakes up in the morning.  He does have chronic neck pain but this has not increased recently.  He denies any significant headache but does have some sinus pressure at times in the frontal regions that has been present off and on for several years.  He reports  a 17 pound weight loss over the last several months.  He was started on gabapentin in June 2022, but he has been on a stable dose over the last couple months.  The patient also reports a 1 to 2-year history of intermittent blackouts.  He has had 3 such episodes so far this year.  The last episode was 2 to 3 weeks ago.  He has been seen through cardiology, a loop recorder was placed and he was told that it was not related to any cardiac rhythm problem.  The patient reports that he has had 2 episodes of loss of consciousness while sitting.  The patient may feel lightheaded and gets diaphoretic.  He may have some nausea associated with events.  He is not sure but he feels that he may have some increasing leg pain associated with these events.  The patient may look flushed in the face with the episodes, he may be unconscious for 1 to 2 minutes, he does not bite his tongue or lose control of the bowels or the bladder.  He comes to this office for further evaluation.  [name] here today for follow-up with history of chronic left leg pain, spinal stimulator has been removed, vertigo, chronic bilateral tinnitus, history of syncope.  EEG was unremarkable.  CT head was unremarkable.  Completed vestibular rehab for the vertigo. The black outs have been going on for 1-2 years. Sees cardiology, loop recorder has not shown cardiac etiology.  On 10/12/21,  got up to walk to the bathroom after watching TV, found himself on the floor of the bathroom, doesn't remember what happened, remembers felt like someone was sitting on him, BP was 100/62, denies diaphoresis, no incontinence. Spells for 1-2 years. Sometimes spells happen when sitting, waves comes over, will eventually pass out. Wasn't shaking all over, but husband reports "disconnected". Doesn't bite tongue. Doesn't think a seizure. No longer has vertigo since vestibular rehab. Has been cardiology, felt vasovagal syncope, knows bear down, but spells come on so fast, he  doesn't have time. Spell is every few months, this month has had 2 so far. This month was the worst. Each time they happen, he is experiencing severe pain, on the 12/16, had severe neck pain. Saw orthopedist on his own to see what could be done about neck pain. Is doing PT now. Has to determine if heart valve is compatible with MRI and loop recorder.   02/19/22 Dr. Terrace Arabia: Frederick Payne is a patient of Dr. Anne Hahn is a patient of Dr. Anne Hahn, following up for chronic low back pain, left leg pain   I reviewed and summarized the referring note.  Past medical history Coronary artery disease, status post CABG Status post mechanical AVR Alcoholic liver disease Previous atrial flutter on Coumadin, Factor V Leyden deficiency, history of DVT after ankle fracture IVC filter in place due to 3 separate episode of PEs,   He has been under the care of cardiologist, had a history of significant palpitation accompanied by passing out episode, episode happened while watching TV sitting in the chair, all occasionally in standing position, he feels nausea, loss of hearing, sweaty, likely strained from his body before transient loss of consciousness, there witnessed staring spells, but no seizure-like activity.  There was also documented low blood pressures 70/47 heart rate less than 40 prior to passing out the episode.   He had extensive cardiac work-up, event monitoring showed no significant abnormality, echocardiogram was satisfactory, carotid Doppler study was negative, loop recorder has not shown cardiac etiology   He no longer have recurrent passing out spells  In August 22, he had sudden onset vertigo, difficulty walking, bilateral chronic tinnitus, he was diagnosed with benign positional vertigo  Had a CT head September 2022 that was normal  Personally reviewed CT cervical myelogram January 2023, slight reversal of normal cervical spine lordosis, multilevel degenerative changes, most advanced at C5-6, mild canal stenosis, severe  bilateral neuroforaminal stenosis, C6-7, severe right and moderate left foraminal stenosis  He underwent vestibular rehabilitation, made marked progress, overall feeling very well, he suffered a tailbone fracture few weeks ago, complains significant pain, difficulty finding a comfortable sitting position  Update April 11, 2022 SS: Here today for early follow-up, reported 2 syncopal episodes 03/31/22, interrogation of loop recorder was unrevealing.  Cardiology felt episodes were likely vasovagal in nature potentially related to severe pain.  Instructed to increase fluid intake.  Metoprolol was increased to 25 mg at bedtime.  He is planning to have ablation of low back soon. Getting alerts on his watch of low HR in the 40's, feeling like he is going to pass out, since Metoprolol.   Spells described as feeling he is going vomit, will go sit on toilet, gets sweaty, feels weak, will lay down on the bed, will lose consciousness for few minutes. Witnessed by husband, as episode is starting to happen gets blank stare. All spells are during times severe pain. Are mostly at night. Had steroid injection to low back earlier this week.   He was  doing well with passing out spells up until 03/31/22, he had 2 that night, that day he had overdone it with yard work. He can't predict when these spells will occur.   He had a generalized seizure back in 2017 related to low sodium.   REVIEW OF SYSTEMS: Out of a complete 14 system review of symptoms, the patient complains only of the following symptoms, and all other reviewed systems are negative.  See HPI  ALLERGIES: Allergies  Allergen Reactions   Benzodiazepines Other (See Comments)    Causes pt to get violent    Contrast Media [Iodinated Contrast Media] Hives   Iodine Anaphylaxis   Other Anaphylaxis    FRUITS FROM TREES.    Pectin Anaphylaxis   Shellfish Allergy Anaphylaxis   Sulfa Antibiotics Anaphylaxis   Diazepam Other (See Comments)    Headache, strange  behavior, memory loss   Lactose Intolerance (Gi) Nausea And Vomiting    HOME MEDICATIONS: Outpatient Medications Prior to Visit  Medication Sig Dispense Refill   albuterol (VENTOLIN HFA) 108 (90 Base) MCG/ACT inhaler INHALE 2 PUFFS BY MOUTH EVERY 4 TO 6 HOURS AS NEEDED 18 g 0   ARNUITY ELLIPTA 100 MCG/ACT AEPB INHALE 1 PUFF INTO THE LUNGS DAILY 30 each 11   ascorbic acid (VITAMIN C) 500 MG tablet Take 500 mg by mouth daily.     aspirin 81 MG tablet Take 81 mg by mouth daily.     COAGUCHEK LANCETS MISC 1 each by Does not apply route as directed. 200 each 1   DHA-PHOSPHATIDYLSERINE PO Take 500 mg by mouth.     DULoxetine (CYMBALTA) 60 MG capsule Take 1 capsule (60 mg total) by mouth daily. 30 capsule 6   FEROSUL 325 (65 Fe) MG tablet TAKE 1 TABLET BY MOUTH EVERY DAY 90 tablet 0   fish oil-omega-3 fatty acids 1000 MG capsule Take 1 g by mouth daily.     iron polysaccharides (NIFEREX) 150 MG capsule TAKE 1 CAPSULE BY MOUTH EVERY DAY 30 capsule 0   L-Methylfolate-Algae (DEPLIN 15) 15-90.314 MG CAPS Take by mouth.     metoprolol succinate (TOPROL-XL) 25 MG 24 hr tablet Take 12.5 mg by mouth daily.     Multiple Vitamins-Minerals (ONE-A-DAY MENS 50+ ADVANTAGE PO) Take by mouth daily.     OVER THE COUNTER MEDICATION      PT/INR Test (COAGUCHEK PT TEST) STRP 1 each by In Vitro route as directed. 48 each 1   PT/INR Testing Monitor (COAGUCHEK XS PLUS SYSTEM) KIT 1 each by Does not apply route as directed. 1 kit 0   S-Adenosylmethionine (SAM-E) 400 MG TABS Take 1 tablet by mouth daily.     St Johns Wort 300 MG CAPS Take 300 mg by mouth 2 (two) times daily.     tadalafil (CIALIS) 5 MG tablet Take 5 mg by mouth daily.     Thiamine HCl (B-1) 100 MG TABS Take by mouth.     warfarin (COUMADIN) 10 MG tablet TAKE 1 TABLET(10 MG) BY MOUTH DAILY 90 tablet 0   warfarin (COUMADIN) 2 MG tablet TAKE 1 TABLET BY MOUTH DAILY 30 tablet 2   warfarin (COUMADIN) 5 MG tablet TAKE 1 TABLET BY MOUTH DAILY 90 tablet 1    No facility-administered medications prior to visit.    PAST MEDICAL HISTORY: Past Medical History:  Diagnosis Date   Aortic stenosis    Had surgery performed   CAD (coronary artery disease)    Chronic anemia    Deep vein  thrombosis (HCC) 2006   After a broken ankle--recently diagnosed with reflex sympathetic dystrophy of the left lower extremity   Elevated LFTs    h/o excessive alcohol   Factor V Leiden (HCC)    Hypertension    Ischemic heart disease    Psychiatric disorder    Underlying   PUD (peptic ulcer disease)    with bleeding   Reflex sympathetic dystrophy     PAST SURGICAL HISTORY: Past Surgical History:  Procedure Laterality Date   AORTIC VALVE REPLACEMENT  07/15/12   27 mm [employer] Jude   CORONARY ARTERY BYPASS GRAFT     x1   GALLBLADDER SURGERY  2008   gastrectomy and vagotomy     greenfield filter      FAMILY HISTORY: Family History  Problem Relation Age of Onset   Healthy Father    Stroke Paternal Grandmother     SOCIAL HISTORY: Social History   Socioeconomic History   Marital status: Married    Spouse name: Not on file   Number of children: 2   Years of education: Not on file   Highest education level: Not on file  Occupational History   Occupation: finance-disabled  Tobacco Use   Smoking status: Former   Smokeless tobacco: Never   Tobacco comments:    quit 15 + yrs ago  Substance and Sexual Activity   Alcohol use: No    Alcohol/week: 0.0 standard drinks of alcohol   Drug use: No   Sexual activity: Yes    Partners: Male  Other Topics Concern   Not on file  Social History Narrative   Lives at home w/ his partner, Susann Givens and his elderly mother   Right-handed   Drinks about 3 cups of tea per day   Social Determinants of Health   Financial Resource Strain: Not on file  Food Insecurity: Not on file  Transportation Needs: Not on file  Physical Activity: Not on file  Stress: Not on file  Social Connections: Not on file  Intimate  Partner Violence: Not on file   PHYSICAL EXAM  Vitals:   04/11/22 1308  BP: 139/84  Pulse: 69  Weight: 188 lb (85.3 kg)  Height: 5\' 8"  (1.727 m)    Body mass index is 28.59 kg/m. No data found.   Generalized: Well developed, in no acute distress  Neurological examination  Mentation: Alert oriented to time, place, history taking. Follows all commands speech and language fluent Cranial nerve II-XII: Pupils were equal round reactive to light. Extraocular movements were full, visual field were full on confrontational test. Facial sensation and strength were normal. Head turning and shoulder shrug  were normal and symmetric. Motor: The motor testing reveals 5 over 5 strength of all 4 extremities. Good symmetric motor tone is noted throughout.  Sensory: Sensory testing is intact to soft touch on all 4 extremities. No evidence of extinction is noted.  Coordination: Cerebellar testing reveals good finger-nose-finger and heel-to-shin bilaterally.  Gait and station: Gait is normal. Reflexes: Deep tendon reflexes are symmetric and normal bilaterally.   DIAGNOSTIC DATA (LABS, IMAGING, TESTING) - I reviewed patient records, labs, notes, testing and imaging myself where available.  Lab Results  Component Value Date   WBC 4.1 04/10/2021   HGB 15.7 04/10/2021   HCT 47.8 04/10/2021   MCV 87 04/10/2021   PLT 161 04/10/2021      Component Value Date/Time   NA 139 04/10/2021 0000   NA 143 02/14/2016 1518   K 4.6 04/10/2021  0000   K 4.3 02/14/2016 1518   CL 101 04/10/2021 0000   CO2 22 04/10/2021 0000   CO2 26 02/14/2016 1518   GLUCOSE 101 (H) 04/10/2021 0000   GLUCOSE 114 (H) 06/24/2016 1116   GLUCOSE 102 02/14/2016 1518   BUN 9 04/10/2021 0000   BUN 13.5 02/14/2016 1518   CREATININE 0.99 04/10/2021 0000   CREATININE 1.00 06/24/2016 1116   CREATININE 1.0 02/14/2016 1518   CALCIUM 9.7 04/10/2021 0000   CALCIUM 10.1 02/14/2016 1518   PROT 7.2 04/10/2021 0000   PROT 8.0 02/14/2016  1518   ALBUMIN 4.5 04/10/2021 0000   ALBUMIN 4.0 02/14/2016 1518   AST 22 04/10/2021 0000   AST 55 (H) 02/14/2016 1518   ALT 19 04/10/2021 0000   ALT 81 (H) 02/14/2016 1518   ALKPHOS 49 04/10/2021 0000   ALKPHOS 60 02/14/2016 1518   BILITOT 0.5 04/10/2021 0000   BILITOT 0.49 02/14/2016 1518   GFRNONAA 83 08/03/2019 1531   GFRAA 95 08/03/2019 1531   Lab Results  Component Value Date   CHOL 235 (H) 04/10/2021   HDL 38 (L) 04/10/2021   LDLCALC 164 (H) 04/10/2021   TRIG 181 (H) 04/10/2021   CHOLHDL 6.2 (H) 04/10/2021   No results found for: "HGBA1C" Lab Results  Component Value Date   VITAMINB12 >2000 (H) 04/01/2019   Lab Results  Component Value Date   TSH 2.10 04/29/2017   Margie Ege, AGNP-C, DNP 04/11/2022, 2:24 PM [facility] [address] [place], [place] [address] [phone]

## 2022-04-14 DIAGNOSIS — I483 Typical atrial flutter: Secondary | ICD-10-CM | POA: Diagnosis not present

## 2022-04-16 NOTE — Telephone Encounter (Signed)
Astir [facility] called and asking for last ofv note and EEG report.  Faxed with confirmation received [phone].

## 2022-04-19 DIAGNOSIS — M542 Cervicalgia: Secondary | ICD-10-CM | POA: Diagnosis not present

## 2022-04-23 DIAGNOSIS — M542 Cervicalgia: Secondary | ICD-10-CM | POA: Diagnosis not present

## 2022-04-25 DIAGNOSIS — M542 Cervicalgia: Secondary | ICD-10-CM | POA: Diagnosis not present

## 2022-05-08 DIAGNOSIS — M47816 Spondylosis without myelopathy or radiculopathy, lumbar region: Secondary | ICD-10-CM | POA: Diagnosis not present

## 2022-05-13 ENCOUNTER — Encounter: Payer: Self-pay | Admitting: Neurology

## 2022-05-13 DIAGNOSIS — R55 Syncope and collapse: Secondary | ICD-10-CM | POA: Diagnosis not present

## 2022-05-20 DIAGNOSIS — I483 Typical atrial flutter: Secondary | ICD-10-CM | POA: Diagnosis not present

## 2022-05-24 DIAGNOSIS — M47816 Spondylosis without myelopathy or radiculopathy, lumbar region: Secondary | ICD-10-CM | POA: Diagnosis not present

## 2022-05-27 DIAGNOSIS — M25562 Pain in left knee: Secondary | ICD-10-CM | POA: Diagnosis not present

## 2022-05-27 DIAGNOSIS — M5451 Vertebrogenic low back pain: Secondary | ICD-10-CM | POA: Diagnosis not present

## 2022-05-27 DIAGNOSIS — M25561 Pain in right knee: Secondary | ICD-10-CM | POA: Diagnosis not present

## 2022-05-27 DIAGNOSIS — R2689 Other abnormalities of gait and mobility: Secondary | ICD-10-CM | POA: Diagnosis not present

## 2022-05-28 DIAGNOSIS — R55 Syncope and collapse: Secondary | ICD-10-CM | POA: Diagnosis not present

## 2022-05-29 DIAGNOSIS — R55 Syncope and collapse: Secondary | ICD-10-CM | POA: Diagnosis not present

## 2022-05-30 DIAGNOSIS — R55 Syncope and collapse: Secondary | ICD-10-CM | POA: Diagnosis not present

## 2022-05-31 DIAGNOSIS — R55 Syncope and collapse: Secondary | ICD-10-CM | POA: Diagnosis not present

## 2022-05-31 DIAGNOSIS — R400 Somnolence: Secondary | ICD-10-CM | POA: Diagnosis not present

## 2022-06-01 DIAGNOSIS — R55 Syncope and collapse: Secondary | ICD-10-CM | POA: Diagnosis not present

## 2022-06-01 DIAGNOSIS — R400 Somnolence: Secondary | ICD-10-CM | POA: Diagnosis not present

## 2022-06-02 DIAGNOSIS — R400 Somnolence: Secondary | ICD-10-CM | POA: Diagnosis not present

## 2022-06-02 DIAGNOSIS — R55 Syncope and collapse: Secondary | ICD-10-CM | POA: Diagnosis not present

## 2022-06-04 DIAGNOSIS — D682 Hereditary deficiency of other clotting factors: Secondary | ICD-10-CM | POA: Diagnosis not present

## 2022-06-04 DIAGNOSIS — Z86718 Personal history of other venous thrombosis and embolism: Secondary | ICD-10-CM | POA: Diagnosis not present

## 2022-06-04 DIAGNOSIS — Z954 Presence of other heart-valve replacement: Secondary | ICD-10-CM | POA: Diagnosis not present

## 2022-06-04 DIAGNOSIS — F4310 Post-traumatic stress disorder, unspecified: Secondary | ICD-10-CM | POA: Diagnosis not present

## 2022-06-04 DIAGNOSIS — R55 Syncope and collapse: Secondary | ICD-10-CM | POA: Diagnosis not present

## 2022-06-04 DIAGNOSIS — Z951 Presence of aortocoronary bypass graft: Secondary | ICD-10-CM | POA: Diagnosis not present

## 2022-06-04 DIAGNOSIS — E782 Mixed hyperlipidemia: Secondary | ICD-10-CM | POA: Diagnosis not present

## 2022-06-04 DIAGNOSIS — I2510 Atherosclerotic heart disease of native coronary artery without angina pectoris: Secondary | ICD-10-CM | POA: Diagnosis not present

## 2022-06-04 DIAGNOSIS — D500 Iron deficiency anemia secondary to blood loss (chronic): Secondary | ICD-10-CM | POA: Diagnosis not present

## 2022-06-04 DIAGNOSIS — Z86711 Personal history of pulmonary embolism: Secondary | ICD-10-CM | POA: Diagnosis not present

## 2022-06-06 DIAGNOSIS — M25561 Pain in right knee: Secondary | ICD-10-CM | POA: Diagnosis not present

## 2022-06-06 DIAGNOSIS — R2689 Other abnormalities of gait and mobility: Secondary | ICD-10-CM | POA: Diagnosis not present

## 2022-06-06 DIAGNOSIS — M25562 Pain in left knee: Secondary | ICD-10-CM | POA: Diagnosis not present

## 2022-06-06 DIAGNOSIS — M5451 Vertebrogenic low back pain: Secondary | ICD-10-CM | POA: Diagnosis not present

## 2022-06-11 DIAGNOSIS — R3912 Poor urinary stream: Secondary | ICD-10-CM | POA: Diagnosis not present

## 2022-06-11 DIAGNOSIS — N5201 Erectile dysfunction due to arterial insufficiency: Secondary | ICD-10-CM | POA: Diagnosis not present

## 2022-06-11 DIAGNOSIS — N401 Enlarged prostate with lower urinary tract symptoms: Secondary | ICD-10-CM | POA: Diagnosis not present

## 2022-06-17 DIAGNOSIS — M25561 Pain in right knee: Secondary | ICD-10-CM | POA: Diagnosis not present

## 2022-06-17 DIAGNOSIS — M25562 Pain in left knee: Secondary | ICD-10-CM | POA: Diagnosis not present

## 2022-06-17 DIAGNOSIS — M5451 Vertebrogenic low back pain: Secondary | ICD-10-CM | POA: Diagnosis not present

## 2022-06-17 DIAGNOSIS — R2689 Other abnormalities of gait and mobility: Secondary | ICD-10-CM | POA: Diagnosis not present

## 2022-06-18 ENCOUNTER — Encounter: Payer: Self-pay | Admitting: Neurology

## 2022-06-18 ENCOUNTER — Other Ambulatory Visit: Payer: Self-pay | Admitting: Neurology

## 2022-06-18 DIAGNOSIS — R55 Syncope and collapse: Secondary | ICD-10-CM

## 2022-06-18 NOTE — Procedures (Signed)
Clinical History : 61 y/o male, PMH of chronic left leg pain. He recently had a spinal stimulator placed in the lower back. Patient reported a spontaneous onset of vertigo. He got up around 4am to go to the bathroom and had severe dizziness and vertigo. Started Gabapentin in June 2022 but has been on a stable dose over the last couple of months .  INTERMITTENT MONITORING with VIDEO TECHNICAL SUMMARY: This AVEEG was performed using equipment provided by [employer] utilizing Bluetooth ( Trackit ) amplifiers with continuous EEGT attended video collection using encrypted remote transmission via [place] secured cellular tower network with data rates for each AVEEG performed. This is a Biomedical engineer AVEEG, obtained, according to the 10-20 international electrode placement system, reformatted digitally into referential and bipolar montages. Data was acquired with a minimum of 21 bipolar connections and sampled at a minimum rate of 250 cycles per second per channel, maximum rate of 450 cycles per second per channel and two channels for EKG. The entire VEEG study was recorded through cable and or radio telemetry for subsequent analysis. Specified epochs of the AVEEG data were identified at the direction of the subject by the depression of a push button by the patient. Each patients event file included data acquired two minutes prior to the push button activation and continuing until two minutes afterwards. AVEEG files were reviewed on [place], Licensed Software provided by [employer] with a digital high frequency filter set at 70 Hz and a low frequency filter set at 1 Hz with a paper speed of 65m/s resulting in 10 seconds per digital page. This entire AVEEG was reviewed by the EEG Technologist. Random time samples, random sleep samples, clips, patient initiated push button files with included patient daily diary logs, EEG Technologist pruned data was reviewed and verified for  accuracy and validity by the governing reading neurologist in full details. This AEEGV was fully compliant with all requirements for CPT 97500 for setup, patient education, take down and administered by an EEG technologist. Long-Term EEG with Video was monitored intermittently by a qualified EEG technologist for the entirety of the recording; quality check-ins were performed at a minimum of every two hours, checking and documenting real-time data and video to assure the integrity and quality of the recording (e.g., camera position, electrode integrity and impedance), and identify the need for maintenance. For intermittent monitoring, an EEG Technologist monitored no more than 12 patients concurrently. Diagnostic video was captured at least 80% of the time during the recording.  PATIENT EVENTS: 8/5 Patient Event #1 (12:55 a) - Patient reports waking up to heart pounding Patient Event #2 ((2505L - Patient reports a headache Patient Event #3 ((9767H - Reports feeling heart pounding Patient Event #4 (7:31a) - Eyes blurry Patient Event #5 (1035) - feeling nauseous Patient Event # 6 (0746p) - Headache  TECHNOLOGIST EVENTS: No clear epileptiform activity was detected by the reviewing neurodiagnostic technologist during the recording for further evaluation.  TIME SAMPLES: 10-minutes of every two hours recorded are reviewed as random time samples.  SLEEP SAMPLES: 5-minutes of every 24 hour recorded sleep cycle are reviewed as random sleep samples.  AWAKE: At maximal level of alertness, the posterior dominant background activity was continuous, reactive, low voltage rhythm of 8 - 8.5 Hz. This was symmetric, well-modulated, and attenuated with eye opening. Diffuse, symmetric, frontocentral beta range activity was present.  SLEEP: N1 Sleep (Stage 1) was observed and characterized by the disappearance of alpha rhythm and the appearance of  vertex activity. N2 Sleep (Stage 2) was observed and  characterized by vertex waves, K-complexes, and sleep spindles. N3 (Stage 3) sleep was observed and characterized by high amplitude Delta activity of 20%. REM sleep was observed.  EKG: There were no arrhythmias or abnormalities noted during this recording.  Impression: This is a normal 48 hours ambulatory video EEG. There were multiple button press events without any clear changes in the EEG background.    Alric Ran, MD [facility]

## 2022-06-19 ENCOUNTER — Telehealth: Payer: Self-pay | Admitting: Neurology

## 2022-06-19 NOTE — Telephone Encounter (Signed)
I called pt and relayed results. He verbalized understanding and appreciation for the call.

## 2022-06-19 NOTE — Telephone Encounter (Signed)
Please call the patient, 48-hour ambulatory EEG was normal.  Fortunately he had multiple pushbutton events, that did not result in any clear EEG changes.  This is reassuring.  There does not appear to be any seizure activity.  He can continue to follow-up with his pain management, PCP, cardiology.  Return here for new issues.   Impression: This is a normal 48 hours ambulatory video EEG. There were multiple button press events without any clear changes in the EEG background.

## 2022-06-20 DIAGNOSIS — R2689 Other abnormalities of gait and mobility: Secondary | ICD-10-CM | POA: Diagnosis not present

## 2022-06-20 DIAGNOSIS — M25561 Pain in right knee: Secondary | ICD-10-CM | POA: Diagnosis not present

## 2022-06-20 DIAGNOSIS — M5451 Vertebrogenic low back pain: Secondary | ICD-10-CM | POA: Diagnosis not present

## 2022-06-20 DIAGNOSIS — M25562 Pain in left knee: Secondary | ICD-10-CM | POA: Diagnosis not present

## 2022-06-23 DIAGNOSIS — I483 Typical atrial flutter: Secondary | ICD-10-CM | POA: Diagnosis not present

## 2022-06-24 DIAGNOSIS — M25562 Pain in left knee: Secondary | ICD-10-CM | POA: Diagnosis not present

## 2022-06-24 DIAGNOSIS — M5451 Vertebrogenic low back pain: Secondary | ICD-10-CM | POA: Diagnosis not present

## 2022-06-24 DIAGNOSIS — R2689 Other abnormalities of gait and mobility: Secondary | ICD-10-CM | POA: Diagnosis not present

## 2022-06-24 DIAGNOSIS — M25561 Pain in right knee: Secondary | ICD-10-CM | POA: Diagnosis not present

## 2022-06-26 DIAGNOSIS — M25562 Pain in left knee: Secondary | ICD-10-CM | POA: Diagnosis not present

## 2022-06-26 DIAGNOSIS — M25561 Pain in right knee: Secondary | ICD-10-CM | POA: Diagnosis not present

## 2022-06-26 DIAGNOSIS — R2689 Other abnormalities of gait and mobility: Secondary | ICD-10-CM | POA: Diagnosis not present

## 2022-06-26 DIAGNOSIS — M5451 Vertebrogenic low back pain: Secondary | ICD-10-CM | POA: Diagnosis not present

## 2022-06-27 DIAGNOSIS — M47817 Spondylosis without myelopathy or radiculopathy, lumbosacral region: Secondary | ICD-10-CM | POA: Diagnosis not present

## 2022-06-27 DIAGNOSIS — M47816 Spondylosis without myelopathy or radiculopathy, lumbar region: Secondary | ICD-10-CM | POA: Diagnosis not present

## 2022-06-28 DIAGNOSIS — K219 Gastro-esophageal reflux disease without esophagitis: Secondary | ICD-10-CM | POA: Diagnosis not present

## 2022-06-28 DIAGNOSIS — R1310 Dysphagia, unspecified: Secondary | ICD-10-CM | POA: Diagnosis not present

## 2022-07-22 ENCOUNTER — Encounter: Payer: Self-pay | Admitting: Neurology

## 2022-07-24 DIAGNOSIS — L814 Other melanin hyperpigmentation: Secondary | ICD-10-CM | POA: Diagnosis not present

## 2022-07-24 DIAGNOSIS — E663 Overweight: Secondary | ICD-10-CM | POA: Diagnosis not present

## 2022-07-24 DIAGNOSIS — B078 Other viral warts: Secondary | ICD-10-CM | POA: Diagnosis not present

## 2022-07-24 DIAGNOSIS — W908XXS Exposure to other nonionizing radiation, sequela: Secondary | ICD-10-CM | POA: Diagnosis not present

## 2022-07-24 DIAGNOSIS — X32XXXS Exposure to sunlight, sequela: Secondary | ICD-10-CM | POA: Diagnosis not present

## 2022-07-24 DIAGNOSIS — L578 Other skin changes due to chronic exposure to nonionizing radiation: Secondary | ICD-10-CM | POA: Diagnosis not present

## 2022-07-24 DIAGNOSIS — Z86008 Personal history of in-situ neoplasm of other site: Secondary | ICD-10-CM | POA: Diagnosis not present

## 2022-07-29 DIAGNOSIS — I4892 Unspecified atrial flutter: Secondary | ICD-10-CM | POA: Diagnosis not present

## 2022-07-29 DIAGNOSIS — I4891 Unspecified atrial fibrillation: Secondary | ICD-10-CM | POA: Diagnosis not present

## 2022-08-01 DIAGNOSIS — Z23 Encounter for immunization: Secondary | ICD-10-CM | POA: Diagnosis not present

## 2022-08-05 DIAGNOSIS — R1310 Dysphagia, unspecified: Secondary | ICD-10-CM | POA: Diagnosis not present

## 2022-08-05 DIAGNOSIS — F419 Anxiety disorder, unspecified: Secondary | ICD-10-CM | POA: Diagnosis not present

## 2022-08-05 DIAGNOSIS — K219 Gastro-esophageal reflux disease without esophagitis: Secondary | ICD-10-CM | POA: Diagnosis not present

## 2022-08-05 DIAGNOSIS — Z538 Procedure and treatment not carried out for other reasons: Secondary | ICD-10-CM | POA: Diagnosis not present

## 2022-08-15 DIAGNOSIS — R1310 Dysphagia, unspecified: Secondary | ICD-10-CM | POA: Diagnosis not present

## 2022-08-15 DIAGNOSIS — K5900 Constipation, unspecified: Secondary | ICD-10-CM | POA: Diagnosis not present

## 2022-08-15 DIAGNOSIS — K219 Gastro-esophageal reflux disease without esophagitis: Secondary | ICD-10-CM | POA: Diagnosis not present

## 2022-08-15 DIAGNOSIS — R112 Nausea with vomiting, unspecified: Secondary | ICD-10-CM | POA: Diagnosis not present

## 2022-08-27 DIAGNOSIS — R112 Nausea with vomiting, unspecified: Secondary | ICD-10-CM | POA: Diagnosis not present

## 2022-08-27 DIAGNOSIS — K3184 Gastroparesis: Secondary | ICD-10-CM | POA: Diagnosis not present

## 2022-08-27 DIAGNOSIS — R1310 Dysphagia, unspecified: Secondary | ICD-10-CM | POA: Diagnosis not present

## 2022-08-27 DIAGNOSIS — K219 Gastro-esophageal reflux disease without esophagitis: Secondary | ICD-10-CM | POA: Diagnosis not present

## 2022-08-28 ENCOUNTER — Encounter: Payer: Self-pay | Admitting: Neurology

## 2022-08-28 ENCOUNTER — Ambulatory Visit (INDEPENDENT_AMBULATORY_CARE_PROVIDER_SITE_OTHER): Payer: Medicare Other | Admitting: Neurology

## 2022-08-28 VITALS — BP 127/76 | HR 73 | Ht 68.0 in | Wt 185.0 lb

## 2022-08-28 DIAGNOSIS — G90522 Complex regional pain syndrome I of left lower limb: Secondary | ICD-10-CM

## 2022-08-28 DIAGNOSIS — R55 Syncope and collapse: Secondary | ICD-10-CM

## 2022-08-28 DIAGNOSIS — H8110 Benign paroxysmal vertigo, unspecified ear: Secondary | ICD-10-CM | POA: Diagnosis not present

## 2022-08-28 NOTE — Progress Notes (Signed)
PATIENT: Frederick Payne DOB: 06-23-1961  REASON FOR VISIT: Follow up HISTORY FROM: Patient PRIMARY NEUROLOGIST: Dr. Terrace Arabia now that Dr. Anne Hahn is retired  Assessment and plan  1.  Benign positional vertigo in August 2022 2.  Fainting spells 3.  Chronic neck pain, low back pain, chronic left lower extremity pain -48-hour ambulatory video EEG in August 2023 was normal, there were multiple button press events without any clear changes in EEG background -Of note, witnessed generalized seizure in 2017 felt to be related to hyponatremia -Cardiac evaluation has been unrevealing, has loop recorder -CT head was normal September 2022 -EEG was normal August 2022 -Continue working with gastroenterology for evaluation of spells, reportedly found to have gastroparesis -Turn to clinic for new issues  HISTORY OF PRESENT ILLNESS: 06/20/2021 Dr. Anne Hahn: Frederick Payne is a 61 year old right-handed white male with a history of chronic left leg pain.  He just recently had a spinal stimulator placed in the low back.  He comes in to the office today for evaluation of several new problems.  The patient reports that 6 days ago he began having spontaneous onset of vertigo.  He got up in the morning around 4 AM to go the bathroom and had severe dizziness and vertigo, he staggered but did not fall.  He had no nausea or vomiting.  He has bilateral tinnitus, but this is a chronic issue.  No change in hearing has been noted.  He reports no double vision, loss of vision, slurred speech, or numbness or weakness of the face, arms, or legs that is new or different.  At times, he may have some numbness in the arms when he wakes up in the morning.  He does have chronic neck pain but this has not increased recently.  He denies any significant headache but does have some sinus pressure at times in the frontal regions that has been present off and on for several years.  He reports a 17 pound weight loss over the last several months.  He  was started on gabapentin in June 2022, but he has been on a stable dose over the last couple months.  The patient also reports a 1 to 2-year history of intermittent blackouts.  He has had 3 such episodes so far this year.  The last episode was 2 to 3 weeks ago.  He has been seen through cardiology, a loop recorder was placed and he was told that it was not related to any cardiac rhythm problem.  The patient reports that he has had 2 episodes of loss of consciousness while sitting.  The patient may feel lightheaded and gets diaphoretic.  He may have some nausea associated with events.  He is not sure but he feels that he may have some increasing leg pain associated with these events.  The patient may look flushed in the face with the episodes, he may be unconscious for 1 to 2 minutes, he does not bite his tongue or lose control of the bowels or the bladder.  He comes to this office for further evaluation.  Frederick Payne here today for follow-up with history of chronic left leg pain, spinal stimulator has been removed, vertigo, chronic bilateral tinnitus, history of syncope.  EEG was unremarkable.  CT head was unremarkable.  Completed vestibular rehab for the vertigo. The black outs have been going on for 1-2 years. Sees cardiology, loop recorder has not shown cardiac etiology.  On 10/12/21, got up to walk to the bathroom after watching TV,  found himself on the floor of the bathroom, doesn't remember what happened, remembers felt like someone was sitting on him, BP was 100/62, denies diaphoresis, no incontinence. Spells for 1-2 years. Sometimes spells happen when sitting, waves comes over, will eventually pass out. Wasn't shaking all over, but husband reports "disconnected". Doesn't bite tongue. Doesn't think a seizure. No longer has vertigo since vestibular rehab. Has been cardiology, felt vasovagal syncope, knows bear down, but spells come on so fast, he doesn't have time. Spell is every few months, this month has  had 2 so far. This month was the worst. Each time they happen, he is experiencing severe pain, on the 12/16, had severe neck pain. Saw orthopedist on his own to see what could be done about neck pain. Is doing PT now. Has to determine if heart valve is compatible with MRI and loop recorder.   02/19/22 Dr. Terrace Arabia: Frederick Payne is a patient of Dr. Anne Hahn, following up for chronic low back pain, left leg pain   I reviewed and summarized the referring note.  Past medical history Coronary artery disease, status post CABG Status post mechanical AVR Alcoholic liver disease Previous atrial flutter on Coumadin, Factor V Leyden deficiency, history of DVT after ankle fracture IVC filter in place due to 3 separate episode of PEs,   He has been under the care of cardiologist, had a history of significant palpitation accompanied by passing out episode, episode happened while watching TV sitting in the chair, all occasionally in standing position, he feels nausea, loss of hearing, sweaty, likely strained from his body before transient loss of consciousness, there witnessed staring spells, but no seizure-like activity.  There was also documented low blood pressures 70/47 heart rate less than 40 prior to passing out the episode.   He had extensive cardiac work-up, event monitoring showed no significant abnormality, echocardiogram was satisfactory, carotid Doppler study was negative, loop recorder has not shown cardiac etiology   He no longer have recurrent passing out spells  In August 22, he had sudden onset vertigo, difficulty walking, bilateral chronic tinnitus, he was diagnosed with benign positional vertigo  Had a CT head September 2022 that was normal  Personally reviewed CT cervical myelogram January 2023, slight reversal of normal cervical spine lordosis, multilevel degenerative changes, most advanced at C5-6, mild canal stenosis, severe bilateral neuroforaminal stenosis, C6-7, severe right and moderate  left foraminal stenosis  He underwent vestibular rehabilitation, made marked progress, overall feeling very well, he suffered a tailbone fracture few weeks ago, complains significant pain, difficulty finding a comfortable sitting position  Update April 11, 2022 SS: Here today for early follow-up, reported 2 syncopal episodes 03/31/22, interrogation of loop recorder was unrevealing.  Cardiology felt episodes were likely vasovagal in nature potentially related to severe pain.  Instructed to increase fluid intake.  Metoprolol was increased to 25 mg at bedtime.  He is planning to have ablation of low back soon. Getting alerts on his watch of low HR in the 40's, feeling like he is going to pass out, since Metoprolol.   Spells described as feeling he is going vomit, will go sit on toilet, gets sweaty, feels weak, will lay down on the bed, will lose consciousness for few minutes. Witnessed by husband, as episode is starting to happen gets blank stare. All spells are during times severe pain. Are mostly at night. Had steroid injection to low back earlier this week.   He was doing well with passing out spells up until 03/31/22, he  had 2 that night, that day he had overdone it with yard work. He can't predict when these spells will occur.   He had a generalized seizure back in 2017 related to low sodium.   Update August 28, 2022 SS: Reports on October 14 episode, gets abdominal cramping, gets wave of feeling like passing out, after episode, vomiting/diarrhea. Found to have gastroparesis yesterday at [facility], planning for EGD. No cardiac issues. Spells are usually before or after meals at the end of the day. Loop recorder managed by cardiology at [facility], no events reported. Left leg pain is better.   Impression: This is a normal 48 hours ambulatory video EEG. There were multiple button press events without any clear changes in the EEG background.  REVIEW OF SYSTEMS: Out of a complete 14 system review of symptoms,  the patient complains only of the following symptoms, and all other reviewed systems are negative.  See HPI  ALLERGIES: Allergies  Allergen Reactions   Benzodiazepines Other (See Comments)    Causes pt to get violent    Contrast Media [Iodinated Contrast Media] Hives   Iodine Anaphylaxis   Other Anaphylaxis    FRUITS FROM TREES.    Pectin Anaphylaxis   Shellfish Allergy Anaphylaxis   Sulfa Antibiotics Anaphylaxis   Diazepam Other (See Comments)    Headache, strange behavior, memory loss   Lactose Intolerance (Gi) Nausea And Vomiting    HOME MEDICATIONS: Outpatient Medications Prior to Visit  Medication Sig Dispense Refill   albuterol (VENTOLIN HFA) 108 (90 Base) MCG/ACT inhaler INHALE 2 PUFFS BY MOUTH EVERY 4 TO 6 HOURS AS NEEDED 18 g 0   ARNUITY ELLIPTA 100 MCG/ACT AEPB INHALE 1 PUFF INTO THE LUNGS DAILY 30 each 11   ascorbic acid (VITAMIN C) 500 MG tablet Take 500 mg by mouth daily.     aspirin 81 MG tablet Take 81 mg by mouth daily.     COAGUCHEK LANCETS MISC 1 each by Does not apply route as directed. 200 each 1   DHA-PHOSPHATIDYLSERINE PO Take 500 mg by mouth.     FEROSUL 325 (65 Fe) MG tablet TAKE 1 TABLET BY MOUTH EVERY DAY 90 tablet 0   fish oil-omega-3 fatty acids 1000 MG capsule Take 1 g by mouth daily.     iron polysaccharides (NIFEREX) 150 MG capsule TAKE 1 CAPSULE BY MOUTH EVERY DAY 30 capsule 0   L-Methylfolate-Algae (DEPLIN 15) 15-90.314 MG CAPS Take by mouth.     Multiple Vitamins-Minerals (ONE-A-DAY MENS 50+ ADVANTAGE PO) Take by mouth daily.     OVER THE COUNTER MEDICATION      PT/INR Test (COAGUCHEK PT TEST) STRP 1 each by In Vitro route as directed. 48 each 1   PT/INR Testing Monitor (COAGUCHEK XS PLUS SYSTEM) KIT 1 each by Does not apply route as directed. 1 kit 0   S-Adenosylmethionine (SAM-E) 400 MG TABS Take 1 tablet by mouth daily.     St Johns Wort 300 MG CAPS Take 300 mg by mouth 2 (two) times daily.     tadalafil (CIALIS) 5 MG tablet Take 5 mg by  mouth daily.     Thiamine HCl (B-1) 100 MG TABS Take by mouth.     warfarin (COUMADIN) 10 MG tablet TAKE 1 TABLET(10 MG) BY MOUTH DAILY 90 tablet 0   warfarin (COUMADIN) 2 MG tablet TAKE 1 TABLET BY MOUTH DAILY 30 tablet 2   warfarin (COUMADIN) 5 MG tablet TAKE 1 TABLET BY MOUTH DAILY 90 tablet 1  DULoxetine (CYMBALTA) 60 MG capsule Take 1 capsule (60 mg total) by mouth daily. 30 capsule 6   metoprolol succinate (TOPROL-XL) 25 MG 24 hr tablet Take 12.5 mg by mouth daily.     No facility-administered medications prior to visit.    PAST MEDICAL HISTORY: Past Medical History:  Diagnosis Date   Aortic stenosis    Had surgery performed   CAD (coronary artery disease)    Chronic anemia    Deep vein thrombosis (HCC) 2006   After a broken ankle--recently diagnosed with reflex sympathetic dystrophy of the left lower extremity   Elevated LFTs    h/o excessive alcohol   Factor V Leiden (HCC)    Hypertension    Ischemic heart disease    Psychiatric disorder    Underlying   PUD (peptic ulcer disease)    with bleeding   Reflex sympathetic dystrophy     PAST SURGICAL HISTORY: Past Surgical History:  Procedure Laterality Date   AORTIC VALVE REPLACEMENT  07/15/12   27 mm St Jude   CORONARY ARTERY BYPASS GRAFT     x1   GALLBLADDER SURGERY  2008   gastrectomy and vagotomy     greenfield filter      FAMILY HISTORY: Family History  Problem Relation Age of Onset   Healthy Father    Stroke Paternal Grandmother     SOCIAL HISTORY: Social History   Socioeconomic History   Marital status: Married    Spouse name: Not on file   Number of children: 2   Years of education: Not on file   Highest education level: Not on file  Occupational History   Occupation: finance-disabled  Tobacco Use   Smoking status: Former   Smokeless tobacco: Never   Tobacco comments:    quit 15 + yrs ago  Substance and Sexual Activity   Alcohol use: No    Alcohol/week: 0.0 standard drinks of alcohol    Drug use: No   Sexual activity: Yes    Partners: Male  Other Topics Concern   Not on file  Social History Narrative   Lives at home w/ his partner, Susann Givens and his elderly mother   Right-handed   Drinks about 3 cups of tea per day   Social Determinants of Health   Financial Resource Strain: Not on file  Food Insecurity: Not on file  Transportation Needs: Not on file  Physical Activity: Not on file  Stress: Not on file  Social Connections: Not on file  Intimate Partner Violence: Not on file   PHYSICAL EXAM  Vitals:   08/28/22 0923  BP: 127/76  Pulse: 73  Weight: 185 lb (83.9 kg)  Height: 5\' 8"  (1.727 m)   Body mass index is 28.13 kg/m. No data found.  Generalized: Well developed, in no acute distress  Neurological examination  Mentation: Alert oriented to time, place, history taking. Follows all commands speech and language fluent Cranial nerve II-XII: Pupils were equal round reactive to light. Extraocular movements were full, visual field were full on confrontational test. Facial sensation and strength were normal. Head turning and shoulder shrug  were normal and symmetric. Motor: The motor testing reveals 5 over 5 strength of all 4 extremities. Good symmetric motor tone is noted throughout.  Sensory: Sensory testing is intact to soft touch on all 4 extremities. No evidence of extinction is noted.  Coordination: Cerebellar testing reveals good finger-nose-finger and heel-to-shin bilaterally.  Gait and station: Gait is normal.  Tandem gait is steady. Reflexes: Deep  tendon reflexes are symmetric and normal bilaterally.   DIAGNOSTIC DATA (LABS, IMAGING, TESTING) - I reviewed patient records, labs, notes, testing and imaging myself where available.  Lab Results  Component Value Date   WBC 4.1 04/10/2021   HGB 15.7 04/10/2021   HCT 47.8 04/10/2021   MCV 87 04/10/2021   PLT 161 04/10/2021      Component Value Date/Time   NA 139 04/10/2021 0000   NA 143 02/14/2016  1518   K 4.6 04/10/2021 0000   K 4.3 02/14/2016 1518   CL 101 04/10/2021 0000   CO2 22 04/10/2021 0000   CO2 26 02/14/2016 1518   GLUCOSE 101 (H) 04/10/2021 0000   GLUCOSE 114 (H) 06/24/2016 1116   GLUCOSE 102 02/14/2016 1518   BUN 9 04/10/2021 0000   BUN 13.5 02/14/2016 1518   CREATININE 0.99 04/10/2021 0000   CREATININE 1.00 06/24/2016 1116   CREATININE 1.0 02/14/2016 1518   CALCIUM 9.7 04/10/2021 0000   CALCIUM 10.1 02/14/2016 1518   PROT 7.2 04/10/2021 0000   PROT 8.0 02/14/2016 1518   ALBUMIN 4.5 04/10/2021 0000   ALBUMIN 4.0 02/14/2016 1518   AST 22 04/10/2021 0000   AST 55 (H) 02/14/2016 1518   ALT 19 04/10/2021 0000   ALT 81 (H) 02/14/2016 1518   ALKPHOS 49 04/10/2021 0000   ALKPHOS 60 02/14/2016 1518   BILITOT 0.5 04/10/2021 0000   BILITOT 0.49 02/14/2016 1518   GFRNONAA 83 08/03/2019 1531   GFRAA 95 08/03/2019 1531   Lab Results  Component Value Date   CHOL 235 (H) 04/10/2021   HDL 38 (L) 04/10/2021   LDLCALC 164 (H) 04/10/2021   TRIG 181 (H) 04/10/2021   CHOLHDL 6.2 (H) 04/10/2021   No results found for: "HGBA1C" Lab Results  Component Value Date   VITAMINB12 >2000 (H) 04/01/2019   Lab Results  Component Value Date   TSH 2.10 04/29/2017   Margie Ege, AGNP-C, DNP 08/28/2022, 10:05 AM [facility] [address] [place], [place] [address] [phone]

## 2022-09-02 DIAGNOSIS — I483 Typical atrial flutter: Secondary | ICD-10-CM | POA: Diagnosis not present

## 2022-09-05 ENCOUNTER — Other Ambulatory Visit: Payer: Self-pay | Admitting: Family Medicine

## 2022-09-05 NOTE — Telephone Encounter (Signed)
Is this okay to refill? 

## 2022-09-09 DIAGNOSIS — M7062 Trochanteric bursitis, left hip: Secondary | ICD-10-CM | POA: Diagnosis not present

## 2022-09-09 DIAGNOSIS — M79644 Pain in right finger(s): Secondary | ICD-10-CM | POA: Diagnosis not present

## 2022-09-09 DIAGNOSIS — M7061 Trochanteric bursitis, right hip: Secondary | ICD-10-CM | POA: Diagnosis not present

## 2022-09-10 DIAGNOSIS — K2100 Gastro-esophageal reflux disease with esophagitis, without bleeding: Secondary | ICD-10-CM | POA: Diagnosis not present

## 2022-09-10 DIAGNOSIS — R112 Nausea with vomiting, unspecified: Secondary | ICD-10-CM | POA: Diagnosis not present

## 2022-09-10 DIAGNOSIS — F325 Major depressive disorder, single episode, in full remission: Secondary | ICD-10-CM | POA: Diagnosis not present

## 2022-09-10 DIAGNOSIS — Z903 Acquired absence of stomach [part of]: Secondary | ICD-10-CM | POA: Diagnosis not present

## 2022-09-10 DIAGNOSIS — K3184 Gastroparesis: Secondary | ICD-10-CM | POA: Diagnosis not present

## 2022-09-10 DIAGNOSIS — R55 Syncope and collapse: Secondary | ICD-10-CM | POA: Diagnosis not present

## 2022-09-26 DIAGNOSIS — R1310 Dysphagia, unspecified: Secondary | ICD-10-CM | POA: Diagnosis not present

## 2022-09-26 DIAGNOSIS — K625 Hemorrhage of anus and rectum: Secondary | ICD-10-CM | POA: Diagnosis not present

## 2022-09-26 DIAGNOSIS — Z86010 Personal history of colonic polyps: Secondary | ICD-10-CM | POA: Diagnosis not present

## 2022-09-26 DIAGNOSIS — K3184 Gastroparesis: Secondary | ICD-10-CM | POA: Diagnosis not present

## 2022-09-26 DIAGNOSIS — R634 Abnormal weight loss: Secondary | ICD-10-CM | POA: Diagnosis not present

## 2022-09-30 DIAGNOSIS — R55 Syncope and collapse: Secondary | ICD-10-CM | POA: Diagnosis not present

## 2022-09-30 DIAGNOSIS — Z951 Presence of aortocoronary bypass graft: Secondary | ICD-10-CM | POA: Diagnosis not present

## 2022-09-30 DIAGNOSIS — I483 Typical atrial flutter: Secondary | ICD-10-CM | POA: Diagnosis not present

## 2022-09-30 DIAGNOSIS — I2510 Atherosclerotic heart disease of native coronary artery without angina pectoris: Secondary | ICD-10-CM | POA: Diagnosis not present

## 2022-09-30 DIAGNOSIS — E785 Hyperlipidemia, unspecified: Secondary | ICD-10-CM | POA: Diagnosis not present

## 2022-09-30 DIAGNOSIS — Z954 Presence of other heart-valve replacement: Secondary | ICD-10-CM | POA: Diagnosis not present

## 2022-09-30 DIAGNOSIS — Z95818 Presence of other cardiac implants and grafts: Secondary | ICD-10-CM | POA: Diagnosis not present

## 2022-09-30 DIAGNOSIS — D682 Hereditary deficiency of other clotting factors: Secondary | ICD-10-CM | POA: Diagnosis not present

## 2022-10-14 DIAGNOSIS — K625 Hemorrhage of anus and rectum: Secondary | ICD-10-CM | POA: Diagnosis not present

## 2022-10-14 DIAGNOSIS — R1310 Dysphagia, unspecified: Secondary | ICD-10-CM | POA: Diagnosis not present

## 2022-10-14 DIAGNOSIS — Z538 Procedure and treatment not carried out for other reasons: Secondary | ICD-10-CM | POA: Diagnosis not present

## 2022-10-14 DIAGNOSIS — E785 Hyperlipidemia, unspecified: Secondary | ICD-10-CM | POA: Diagnosis not present

## 2022-10-14 DIAGNOSIS — J45909 Unspecified asthma, uncomplicated: Secondary | ICD-10-CM | POA: Diagnosis not present

## 2022-10-14 DIAGNOSIS — K219 Gastro-esophageal reflux disease without esophagitis: Secondary | ICD-10-CM | POA: Diagnosis not present

## 2022-10-14 DIAGNOSIS — G4730 Sleep apnea, unspecified: Secondary | ICD-10-CM | POA: Diagnosis not present

## 2022-10-14 DIAGNOSIS — K2970 Gastritis, unspecified, without bleeding: Secondary | ICD-10-CM | POA: Diagnosis not present

## 2022-10-16 DIAGNOSIS — R1310 Dysphagia, unspecified: Secondary | ICD-10-CM | POA: Diagnosis not present

## 2022-11-11 DIAGNOSIS — I483 Typical atrial flutter: Secondary | ICD-10-CM | POA: Diagnosis not present

## 2022-11-12 DIAGNOSIS — R55 Syncope and collapse: Secondary | ICD-10-CM | POA: Diagnosis not present

## 2022-11-12 DIAGNOSIS — Z95818 Presence of other cardiac implants and grafts: Secondary | ICD-10-CM | POA: Diagnosis not present

## 2022-11-12 DIAGNOSIS — I2510 Atherosclerotic heart disease of native coronary artery without angina pectoris: Secondary | ICD-10-CM | POA: Diagnosis not present

## 2022-11-12 DIAGNOSIS — I4892 Unspecified atrial flutter: Secondary | ICD-10-CM | POA: Diagnosis not present

## 2022-11-12 DIAGNOSIS — I483 Typical atrial flutter: Secondary | ICD-10-CM | POA: Diagnosis not present

## 2022-11-12 DIAGNOSIS — Z954 Presence of other heart-valve replacement: Secondary | ICD-10-CM | POA: Diagnosis not present

## 2022-11-12 DIAGNOSIS — I4891 Unspecified atrial fibrillation: Secondary | ICD-10-CM | POA: Diagnosis not present

## 2022-11-12 DIAGNOSIS — Z951 Presence of aortocoronary bypass graft: Secondary | ICD-10-CM | POA: Diagnosis not present

## 2022-11-18 DIAGNOSIS — K625 Hemorrhage of anus and rectum: Secondary | ICD-10-CM | POA: Diagnosis not present

## 2022-11-18 DIAGNOSIS — K5900 Constipation, unspecified: Secondary | ICD-10-CM | POA: Diagnosis not present

## 2022-11-18 DIAGNOSIS — K648 Other hemorrhoids: Secondary | ICD-10-CM | POA: Diagnosis not present

## 2022-11-18 DIAGNOSIS — K3184 Gastroparesis: Secondary | ICD-10-CM | POA: Diagnosis not present

## 2022-11-19 DIAGNOSIS — G4733 Obstructive sleep apnea (adult) (pediatric): Secondary | ICD-10-CM | POA: Diagnosis not present

## 2022-11-19 DIAGNOSIS — Z86718 Personal history of other venous thrombosis and embolism: Secondary | ICD-10-CM | POA: Diagnosis not present

## 2022-11-19 DIAGNOSIS — Z86711 Personal history of pulmonary embolism: Secondary | ICD-10-CM | POA: Diagnosis not present

## 2022-11-19 DIAGNOSIS — E785 Hyperlipidemia, unspecified: Secondary | ICD-10-CM | POA: Diagnosis not present

## 2022-11-19 DIAGNOSIS — Z95818 Presence of other cardiac implants and grafts: Secondary | ICD-10-CM | POA: Diagnosis not present

## 2022-11-19 DIAGNOSIS — Z7901 Long term (current) use of anticoagulants: Secondary | ICD-10-CM | POA: Diagnosis not present

## 2022-11-19 DIAGNOSIS — Z951 Presence of aortocoronary bypass graft: Secondary | ICD-10-CM | POA: Diagnosis not present

## 2022-11-19 DIAGNOSIS — Z7982 Long term (current) use of aspirin: Secondary | ICD-10-CM | POA: Diagnosis not present

## 2022-11-19 DIAGNOSIS — Z952 Presence of prosthetic heart valve: Secondary | ICD-10-CM | POA: Diagnosis not present

## 2022-11-19 DIAGNOSIS — I4891 Unspecified atrial fibrillation: Secondary | ICD-10-CM | POA: Diagnosis not present

## 2022-11-19 DIAGNOSIS — Z79899 Other long term (current) drug therapy: Secondary | ICD-10-CM | POA: Diagnosis not present

## 2022-11-19 DIAGNOSIS — Z953 Presence of xenogenic heart valve: Secondary | ICD-10-CM | POA: Diagnosis not present

## 2022-11-19 DIAGNOSIS — I2510 Atherosclerotic heart disease of native coronary artery without angina pectoris: Secondary | ICD-10-CM | POA: Diagnosis not present

## 2022-11-21 DIAGNOSIS — K649 Unspecified hemorrhoids: Secondary | ICD-10-CM | POA: Diagnosis not present

## 2022-11-26 DIAGNOSIS — H5000 Unspecified esotropia: Secondary | ICD-10-CM | POA: Diagnosis not present

## 2022-11-26 DIAGNOSIS — H2513 Age-related nuclear cataract, bilateral: Secondary | ICD-10-CM | POA: Diagnosis not present

## 2022-11-26 DIAGNOSIS — S0230XD Fracture of orbital floor, unspecified side, subsequent encounter for fracture with routine healing: Secondary | ICD-10-CM | POA: Diagnosis not present

## 2022-12-02 DIAGNOSIS — B356 Tinea cruris: Secondary | ICD-10-CM | POA: Diagnosis not present

## 2022-12-03 DIAGNOSIS — R55 Syncope and collapse: Secondary | ICD-10-CM | POA: Diagnosis not present

## 2022-12-03 DIAGNOSIS — Z954 Presence of other heart-valve replacement: Secondary | ICD-10-CM | POA: Diagnosis not present

## 2022-12-16 DIAGNOSIS — I483 Typical atrial flutter: Secondary | ICD-10-CM | POA: Diagnosis not present

## 2022-12-23 DIAGNOSIS — R0602 Shortness of breath: Secondary | ICD-10-CM | POA: Diagnosis not present

## 2022-12-23 DIAGNOSIS — J309 Allergic rhinitis, unspecified: Secondary | ICD-10-CM | POA: Diagnosis not present

## 2022-12-23 DIAGNOSIS — G4710 Hypersomnia, unspecified: Secondary | ICD-10-CM | POA: Diagnosis not present

## 2022-12-31 DIAGNOSIS — J3089 Other allergic rhinitis: Secondary | ICD-10-CM | POA: Diagnosis not present

## 2023-01-01 DIAGNOSIS — G4733 Obstructive sleep apnea (adult) (pediatric): Secondary | ICD-10-CM | POA: Diagnosis not present

## 2023-01-06 DIAGNOSIS — K625 Hemorrhage of anus and rectum: Secondary | ICD-10-CM | POA: Diagnosis not present

## 2023-01-06 DIAGNOSIS — K5900 Constipation, unspecified: Secondary | ICD-10-CM | POA: Diagnosis not present

## 2023-01-06 DIAGNOSIS — K3184 Gastroparesis: Secondary | ICD-10-CM | POA: Diagnosis not present

## 2023-01-09 DIAGNOSIS — K649 Unspecified hemorrhoids: Secondary | ICD-10-CM | POA: Diagnosis not present

## 2023-01-14 DIAGNOSIS — I2510 Atherosclerotic heart disease of native coronary artery without angina pectoris: Secondary | ICD-10-CM | POA: Diagnosis not present

## 2023-01-15 DIAGNOSIS — I2510 Atherosclerotic heart disease of native coronary artery without angina pectoris: Secondary | ICD-10-CM | POA: Diagnosis not present

## 2023-01-20 DIAGNOSIS — I483 Typical atrial flutter: Secondary | ICD-10-CM | POA: Diagnosis not present

## 2023-01-23 DIAGNOSIS — G4733 Obstructive sleep apnea (adult) (pediatric): Secondary | ICD-10-CM | POA: Diagnosis not present

## 2023-01-23 DIAGNOSIS — J4540 Moderate persistent asthma, uncomplicated: Secondary | ICD-10-CM | POA: Diagnosis not present

## 2023-02-19 DIAGNOSIS — G4733 Obstructive sleep apnea (adult) (pediatric): Secondary | ICD-10-CM | POA: Diagnosis not present

## 2023-02-24 DIAGNOSIS — I483 Typical atrial flutter: Secondary | ICD-10-CM | POA: Diagnosis not present

## 2023-03-10 IMAGING — CT CT CERVICAL SPINE W/ CM
2 series · 10 of 14 positions shown, 12 images · IV contrast (isovue)
Comparison: No direct comparison study available.

CLINICAL DATA: Neck pain

EXAM:
CT MYELOGRAPHY CERVICAL SPINE
TECHNIQUE: CT imaging of the cervical spine was performed after Isovue 300M
contrast administration. Multiplanar CT image reconstructions were
also generated.

[Series 2: cspine soft · axial · 0.32mm/px · z∈[-206,-66]mm · 5 of 106 slices shown, 7 images]
[im 18/106  soft-tissue]
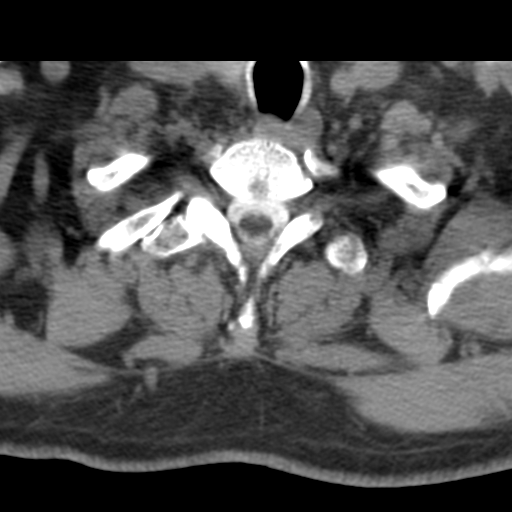
[im 18/106  bone]
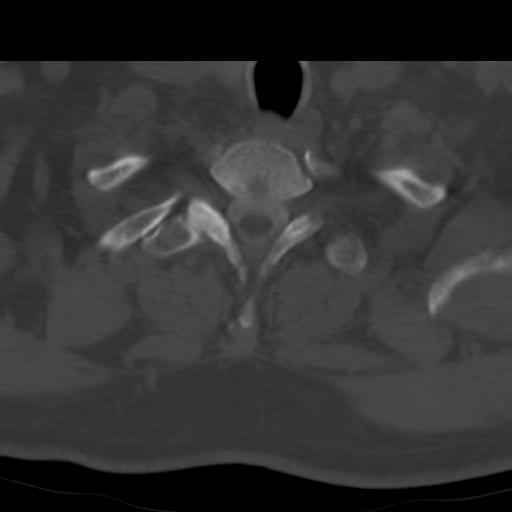
[im 36/106  bone]
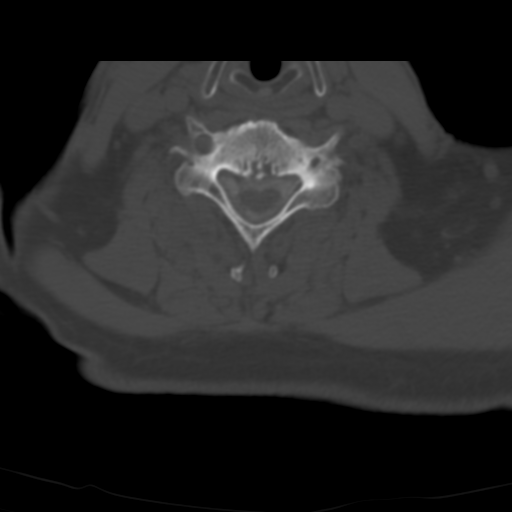
[im 53/106  bone]
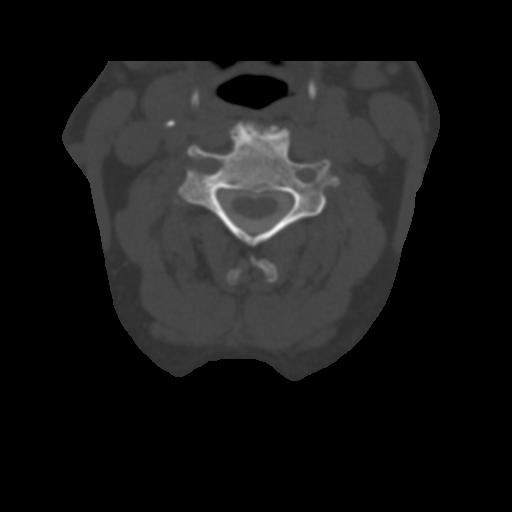
[im 71/106  bone]
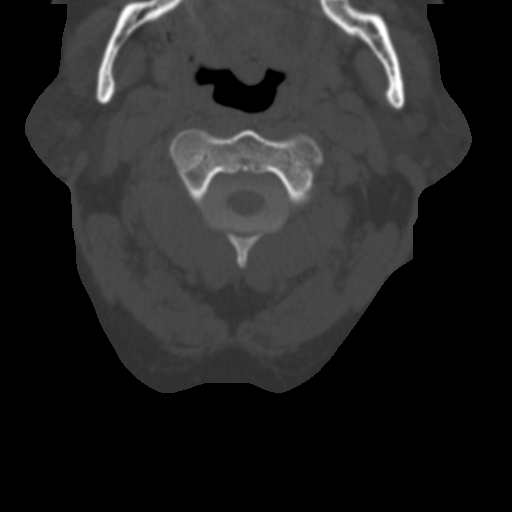
[im 88/106  soft-tissue]
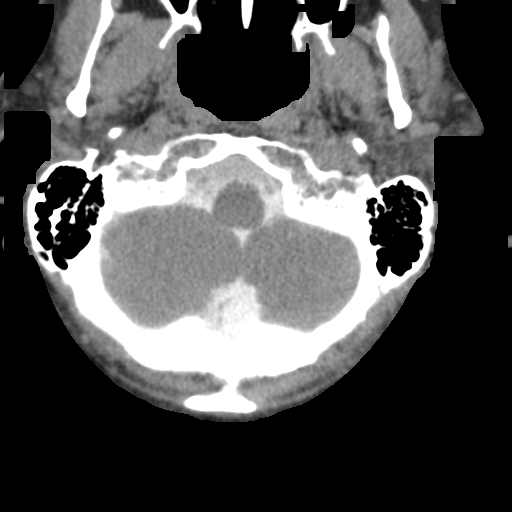
[im 88/106  bone]
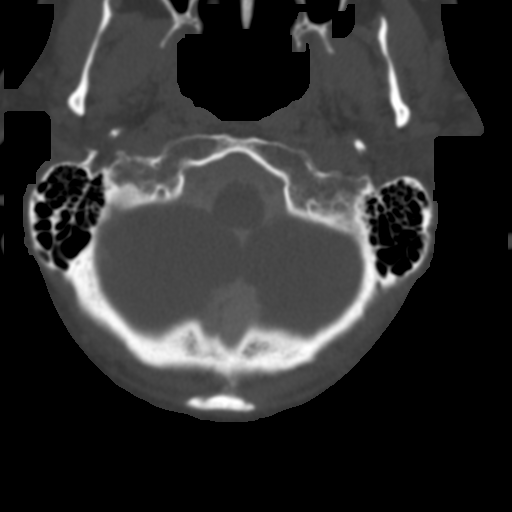

[Series 8: angled axial soft · axial · 0.23mm/px · z∈[-216,-76]mm · 5 of 107 slices shown]
[im 18/107  soft-tissue]
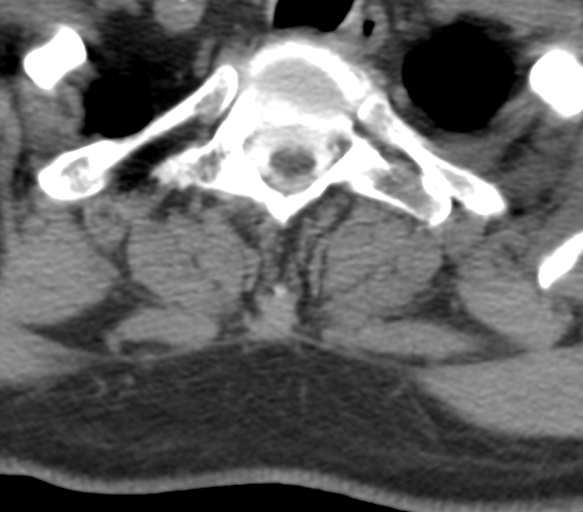
[im 36/107  soft-tissue]
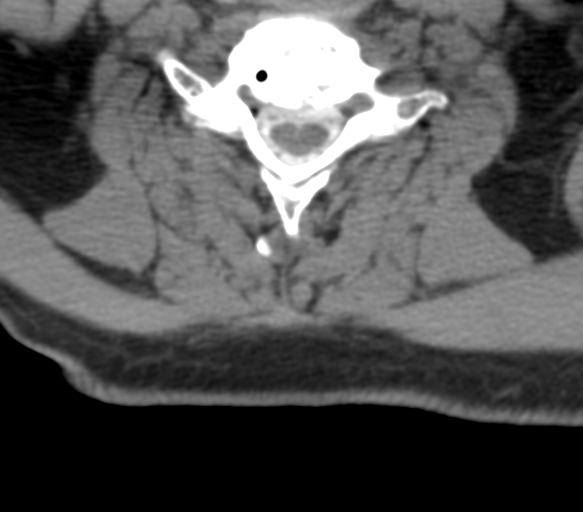
[im 54/107  soft-tissue]
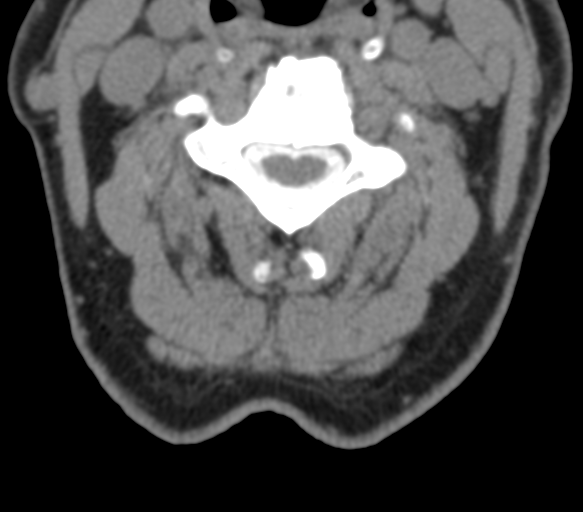
[im 71/107  soft-tissue]
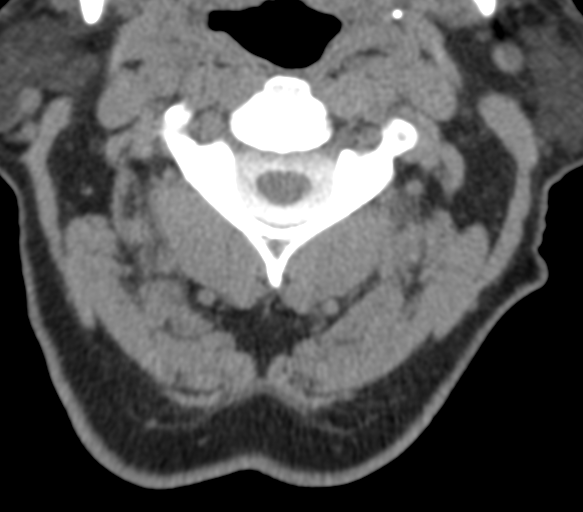
[im 89/107  soft-tissue]
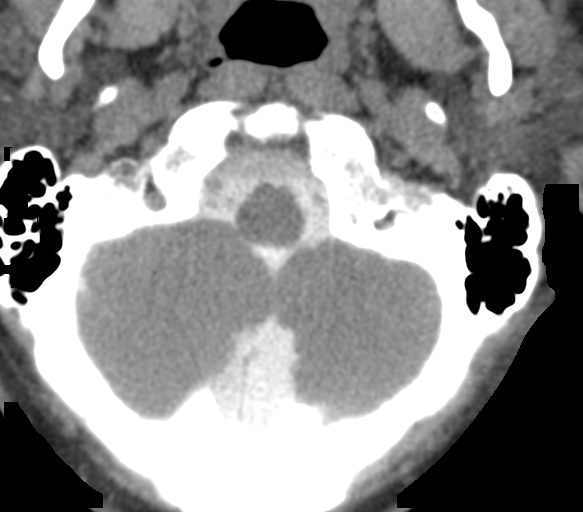

[10 of 14 positions shown; findings below may reference images not displayed]

FINDINGS: The lumbar puncture was performed by radiology, and approximally 10
cc of Omnipaque 300 was injected into the thecal sac; procedure
reported separately.

Alignment: There is slight reversal of the normal cervical spine
lordosis centered at C3-C4. There is no antero or retrolisthesis.

Vertebrae: Vertebral body heights are preserved. There is no
evidence of acute fracture. There is no suspicious osseous lesion.

Cord: Normal size and morphology.

Posterior Fossa and paraspinal tissues: The imaged posterior fossa
is grossly unremarkable. The paraspinal soft tissues are
unremarkable. There is a 1.8 cm right thyroid nodule.

Upper chest: The imaged lung apices are clear.

Disc levels:

There is multilevel intervertebral disc space narrowing, most
advanced at C3-C4, C5-C6, and C6-C7. There is relatively mild
multilevel facet arthropathy.

C2-C3: No significant spinal canal or neural foraminal stenosis.

C3-C4: There is mild uncovertebral and facet arthropathy resulting
in moderate right and mild left neural foraminal stenosis without
significant spinal canal stenosis.

C4-C5: There is a minimal broad-based disc protrusion and mild
uncovertebral and facet arthropathy resulting in mild left and no
significant right neural foraminal stenosis and no significant
spinal canal stenosis.

C5-C6: There is uncovertebral ridging bilaterally and mild bilateral
facet arthropathy resulting in mild spinal canal narrowing with
partial effacement of the ventral thecal sac and severe bilateral
neural foraminal stenosis

C6-C7: There is uncovertebral ridging bilaterally and mild bilateral
facet arthropathy resulting in severe right and moderate left neural
foraminal stenosis without significant spinal canal stenosis.

C7-T1: No significant spinal canal or neural foraminal stenosis.
IMPRESSION: 1. Slight reversal of the normal cervical spine lordosis centered at
C3-C4. No antero or retrolisthesis.
2. Multilevel degenerative changes as detailed above, most advanced
at C5-C6 where there is mild spinal canal stenosis and severe
bilateral neural foraminal stenosis, and C6-C7 where there is severe
right and moderate left neural foraminal stenosis.
3. No high-grade spinal canal stenosis or evidence of cord
compression.
4. 1.8 cm incidental right thyroid nodule. Recommend thyroid
ultrasound.
Reference: [HOSPITAL]. [DATE]): 143-50

## 2023-03-10 IMAGING — XA DG MYELOGRAPHY LUMBAR INJ CERVICAL
13 series · 13 of 13 positions shown · non-contrast
Comparison: none

CLINICAL DATA: 60-year-old male with neck pain. He presents for
cervical CT myelography.

[Series 1: w cervical spine lat · 0.15mm/px · 1 of 1 slices shown]
[im 1/1]
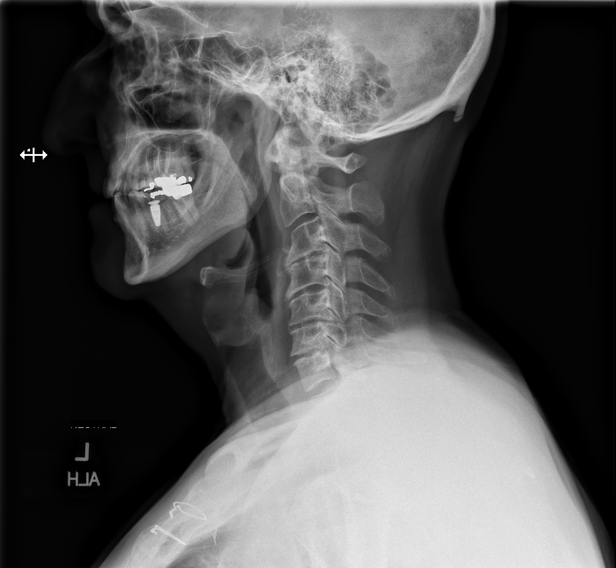

[Series 1: vasc adipose · 1 of 1 slices shown (1 of 10)]
[im 1/1]
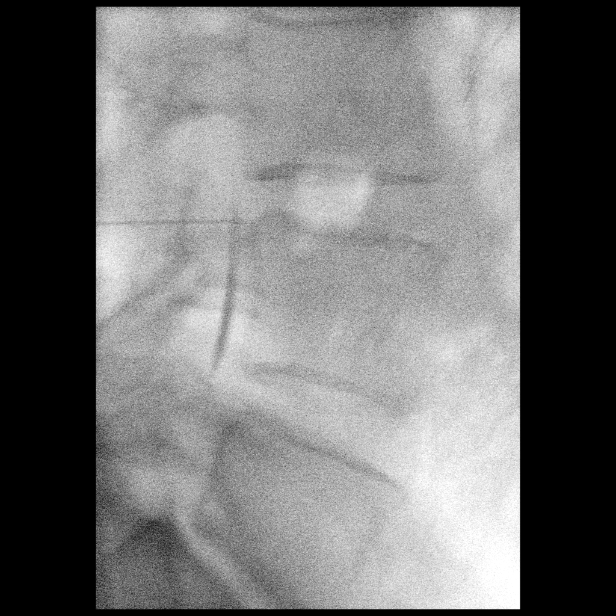

[Series 2: vasc adipose · 1 of 1 slices shown (2 of 10)]
[im 1/1]
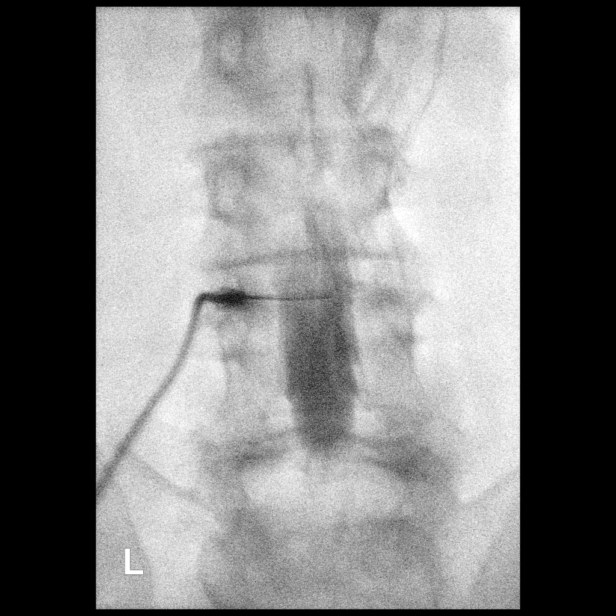

[Series 2: w cervical spine flexion · 0.15mm/px · 1 of 1 slices shown]
[im 1/1]
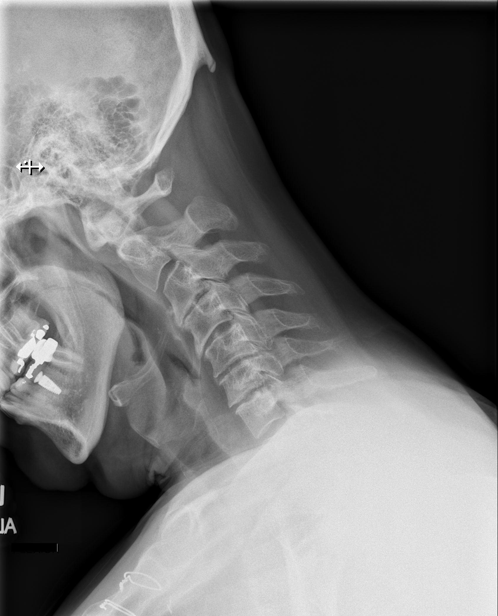

[Series 3: vasc adipose · 1 of 1 slices shown (3 of 10)]
[im 1/1]
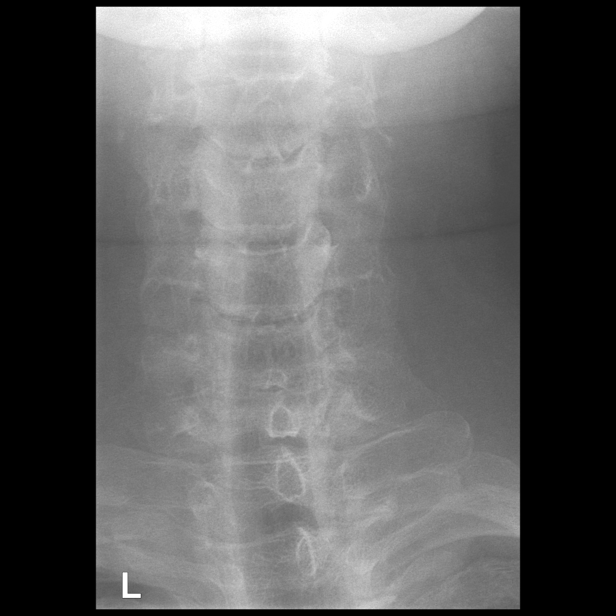

[Series 3: w cervical spine extension · 0.15mm/px · 1 of 1 slices shown]
[im 1/1]
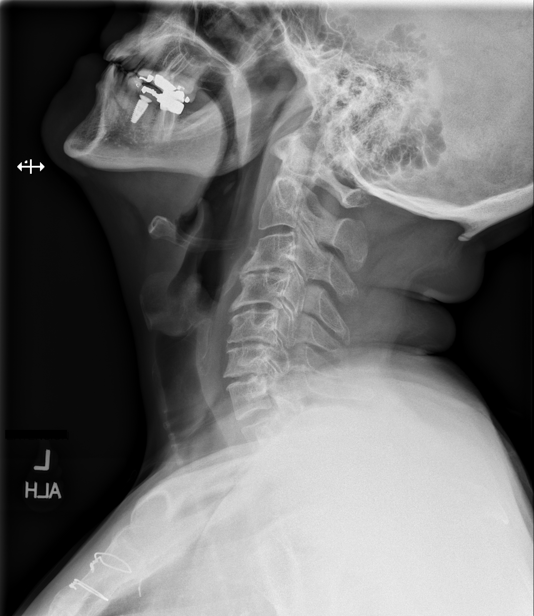

[Series 4: vasc adipose · 1 of 1 slices shown (4 of 10)]
[im 1/1]
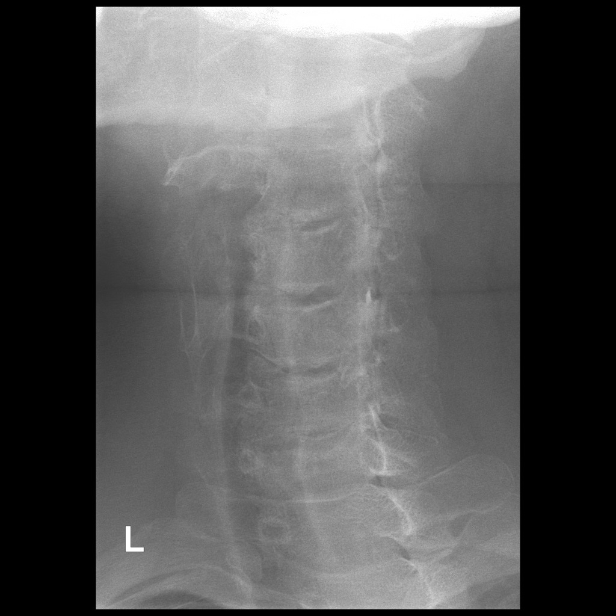

[Series 5: vasc adipose · 1 of 1 slices shown (5 of 10)]
[im 1/1]
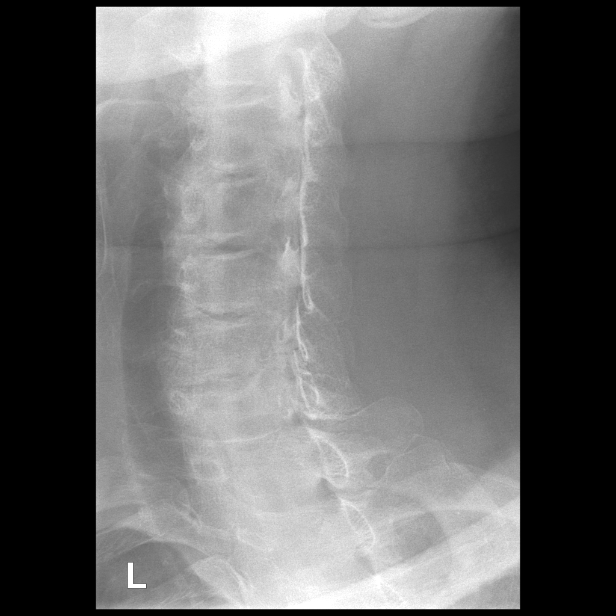

[Series 6: vasc adipose · 1 of 1 slices shown (6 of 10)]
[im 1/1]
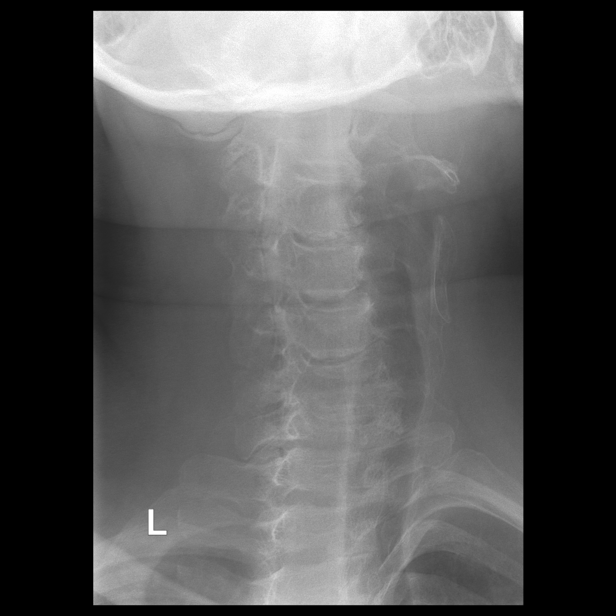

[Series 7: vasc adipose · 1 of 1 slices shown (7 of 10)]
[im 1/1]
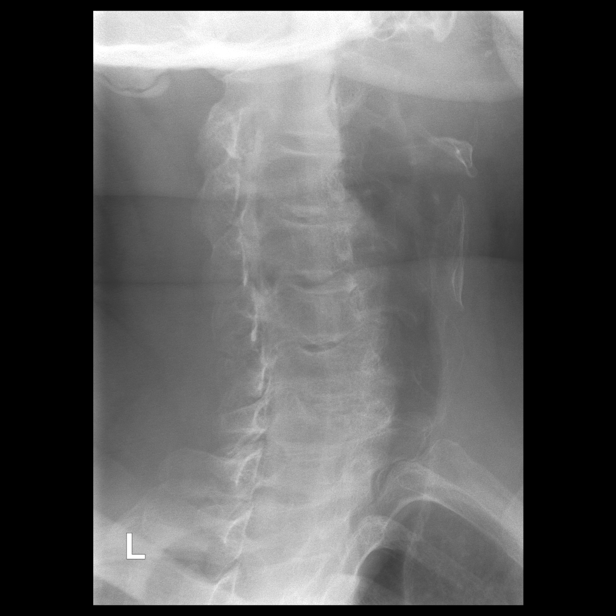

[Series 8: vasc adipose · 1 of 1 slices shown (8 of 10)]
[im 1/1]
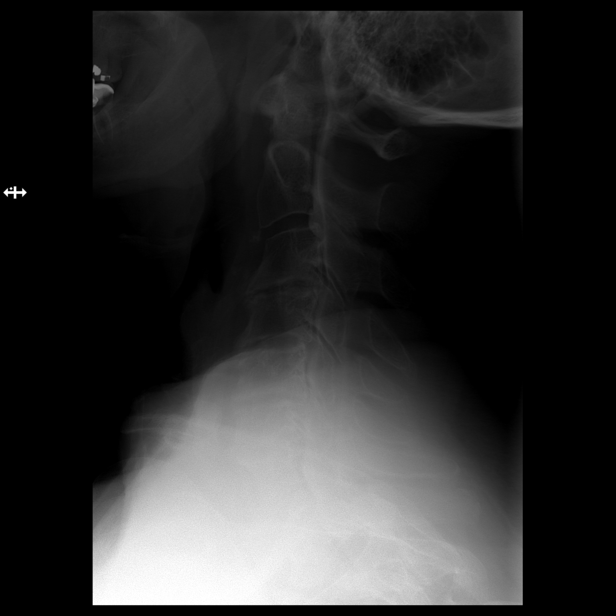

[Series 9: vasc adipose · 1 of 1 slices shown (9 of 10)]
[im 1/1]
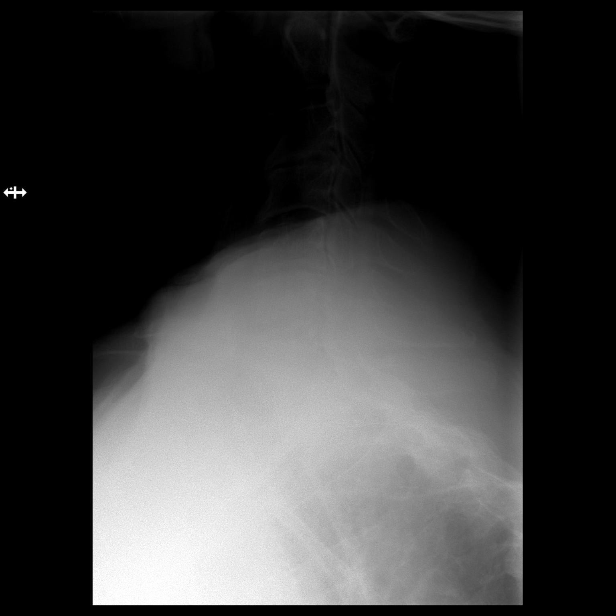

[Series 10: vasc adipose · 1 of 1 slices shown (10 of 10)]
[im 1/1]
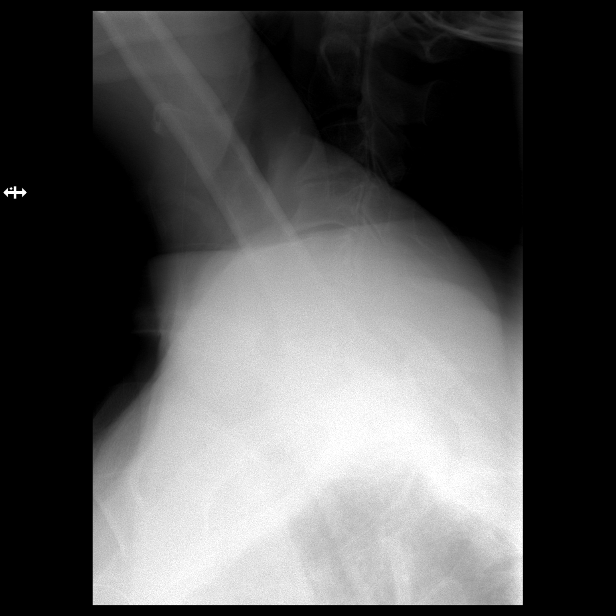

[13 of 13 positions shown; findings below may reference images not displayed]

FLUOROSCOPY TIME:  1 minute 27 seconds

11.9 mGy

PROCEDURE:
LUMBAR PUNCTURE FOR CERVICAL MYELOGRAM

After thorough discussion of risks and benefits of the procedure
including bleeding, infection, injury to nerves, blood vessels,
adjacent structures as well as headache and CSF leak, written and
oral informed consent was obtained. Consent was obtained by Dr.
Sireesha Shipman. We discussed the high likelihood of obtaining a
diagnostic study.

Patient was positioned prone on the fluoroscopy table. Local
anesthesia was provided with 1% lidocaine without epinephrine after
prepped and draped in the usual sterile fashion. Puncture was
performed at L3-L4 using a 3 1/2 inch 22-gauge spinal needle via
left paramedian approach. Using a single pass through the dura, the
needle was placed within the thecal sac, with return of clear CSF.
10 mL of 8mnipaque-WRR was injected into the thecal sac, with normal
opacification of the nerve roots and cauda equina consistent with
free flow within the subarachnoid space. The patient was then moved
to the Trendelenburg position and contrast flowed into the Cervical
spine region.

I personally performed the lumbar puncture and administered the
intrathecal contrast. I also personally supervised acquisition of
the myelogram images.
FINDINGS: Alignment: There is reversal of the normal cervical lordosis.
Perhaps trace degenerative anterolisthesis of C2 on C3 with flexion
which resolves following extension.

Vertebra: No acute fracture, malalignment or suspicious osseous
lesion.

Multilevel degenerative changes most significant at C3-C4, C6-C7 and
C7-T1.
IMPRESSION: 1. Successful L3-L4 lumbar puncture with instillation of intrathecal
contrast for CT myelography.
2. Perhaps trace degenerative anterolisthesis of C2 on C3 with
flexion which resolves in the neutral and extended positions.
3. Reversal of the normal cervical lordosis secondary to multilevel
degenerative disc disease in the neutral position.

## 2023-03-11 DIAGNOSIS — K5900 Constipation, unspecified: Secondary | ICD-10-CM | POA: Diagnosis not present

## 2023-03-11 DIAGNOSIS — Z86010 Personal history of colonic polyps: Secondary | ICD-10-CM | POA: Diagnosis not present

## 2023-03-11 DIAGNOSIS — K3184 Gastroparesis: Secondary | ICD-10-CM | POA: Diagnosis not present

## 2023-03-11 DIAGNOSIS — K219 Gastro-esophageal reflux disease without esophagitis: Secondary | ICD-10-CM | POA: Diagnosis not present

## 2023-03-17 DIAGNOSIS — M4696 Unspecified inflammatory spondylopathy, lumbar region: Secondary | ICD-10-CM | POA: Diagnosis not present

## 2023-03-31 DIAGNOSIS — D682 Hereditary deficiency of other clotting factors: Secondary | ICD-10-CM | POA: Diagnosis not present

## 2023-03-31 DIAGNOSIS — Z954 Presence of other heart-valve replacement: Secondary | ICD-10-CM | POA: Diagnosis not present

## 2023-03-31 DIAGNOSIS — Z951 Presence of aortocoronary bypass graft: Secondary | ICD-10-CM | POA: Diagnosis not present

## 2023-03-31 DIAGNOSIS — I4892 Unspecified atrial flutter: Secondary | ICD-10-CM | POA: Diagnosis not present

## 2023-03-31 DIAGNOSIS — I2510 Atherosclerotic heart disease of native coronary artery without angina pectoris: Secondary | ICD-10-CM | POA: Diagnosis not present

## 2023-03-31 DIAGNOSIS — Z95818 Presence of other cardiac implants and grafts: Secondary | ICD-10-CM | POA: Diagnosis not present

## 2023-03-31 DIAGNOSIS — R55 Syncope and collapse: Secondary | ICD-10-CM | POA: Diagnosis not present

## 2023-03-31 DIAGNOSIS — I4891 Unspecified atrial fibrillation: Secondary | ICD-10-CM | POA: Diagnosis not present

## 2023-03-31 DIAGNOSIS — I483 Typical atrial flutter: Secondary | ICD-10-CM | POA: Diagnosis not present

## 2023-04-01 DIAGNOSIS — E782 Mixed hyperlipidemia: Secondary | ICD-10-CM | POA: Diagnosis not present

## 2023-04-01 DIAGNOSIS — I2510 Atherosclerotic heart disease of native coronary artery without angina pectoris: Secondary | ICD-10-CM | POA: Diagnosis not present

## 2023-04-07 DIAGNOSIS — G4733 Obstructive sleep apnea (adult) (pediatric): Secondary | ICD-10-CM | POA: Diagnosis not present

## 2023-04-24 DIAGNOSIS — M47817 Spondylosis without myelopathy or radiculopathy, lumbosacral region: Secondary | ICD-10-CM | POA: Diagnosis not present

## 2023-04-24 DIAGNOSIS — M47816 Spondylosis without myelopathy or radiculopathy, lumbar region: Secondary | ICD-10-CM | POA: Diagnosis not present

## 2023-04-25 DIAGNOSIS — J4540 Moderate persistent asthma, uncomplicated: Secondary | ICD-10-CM | POA: Diagnosis not present

## 2023-04-25 DIAGNOSIS — G4710 Hypersomnia, unspecified: Secondary | ICD-10-CM | POA: Diagnosis not present

## 2023-04-25 DIAGNOSIS — G4733 Obstructive sleep apnea (adult) (pediatric): Secondary | ICD-10-CM | POA: Diagnosis not present

## 2023-05-05 DIAGNOSIS — I483 Typical atrial flutter: Secondary | ICD-10-CM | POA: Diagnosis not present

## 2023-05-08 DIAGNOSIS — L218 Other seborrheic dermatitis: Secondary | ICD-10-CM | POA: Diagnosis not present

## 2023-05-08 DIAGNOSIS — L400 Psoriasis vulgaris: Secondary | ICD-10-CM | POA: Diagnosis not present

## 2023-05-19 DIAGNOSIS — M4692 Unspecified inflammatory spondylopathy, cervical region: Secondary | ICD-10-CM | POA: Diagnosis not present

## 2023-05-27 DIAGNOSIS — I483 Typical atrial flutter: Secondary | ICD-10-CM | POA: Diagnosis not present

## 2023-05-27 DIAGNOSIS — D500 Iron deficiency anemia secondary to blood loss (chronic): Secondary | ICD-10-CM | POA: Diagnosis not present

## 2023-05-27 DIAGNOSIS — D696 Thrombocytopenia, unspecified: Secondary | ICD-10-CM | POA: Diagnosis not present

## 2023-05-27 DIAGNOSIS — E041 Nontoxic single thyroid nodule: Secondary | ICD-10-CM | POA: Diagnosis not present

## 2023-05-27 DIAGNOSIS — I4892 Unspecified atrial flutter: Secondary | ICD-10-CM | POA: Diagnosis not present

## 2023-05-27 DIAGNOSIS — I4891 Unspecified atrial fibrillation: Secondary | ICD-10-CM | POA: Diagnosis not present

## 2023-06-05 DIAGNOSIS — I6523 Occlusion and stenosis of bilateral carotid arteries: Secondary | ICD-10-CM | POA: Diagnosis not present

## 2023-06-05 DIAGNOSIS — E041 Nontoxic single thyroid nodule: Secondary | ICD-10-CM | POA: Diagnosis not present

## 2023-06-09 DIAGNOSIS — I483 Typical atrial flutter: Secondary | ICD-10-CM | POA: Diagnosis not present

## 2023-06-12 DIAGNOSIS — Z9889 Other specified postprocedural states: Secondary | ICD-10-CM | POA: Diagnosis not present

## 2023-06-12 DIAGNOSIS — N489 Disorder of penis, unspecified: Secondary | ICD-10-CM | POA: Diagnosis not present

## 2023-06-12 DIAGNOSIS — Z87448 Personal history of other diseases of urinary system: Secondary | ICD-10-CM | POA: Diagnosis not present

## 2023-06-12 DIAGNOSIS — R3912 Poor urinary stream: Secondary | ICD-10-CM | POA: Diagnosis not present

## 2023-06-12 DIAGNOSIS — N401 Enlarged prostate with lower urinary tract symptoms: Secondary | ICD-10-CM | POA: Diagnosis not present

## 2023-06-24 DIAGNOSIS — K219 Gastro-esophageal reflux disease without esophagitis: Secondary | ICD-10-CM | POA: Diagnosis not present

## 2023-06-24 DIAGNOSIS — K5900 Constipation, unspecified: Secondary | ICD-10-CM | POA: Diagnosis not present

## 2023-06-24 DIAGNOSIS — K3184 Gastroparesis: Secondary | ICD-10-CM | POA: Diagnosis not present

## 2023-06-24 DIAGNOSIS — R1310 Dysphagia, unspecified: Secondary | ICD-10-CM | POA: Diagnosis not present

## 2023-06-24 DIAGNOSIS — Z86010 Personal history of colonic polyps: Secondary | ICD-10-CM | POA: Diagnosis not present

## 2023-06-24 DIAGNOSIS — I6523 Occlusion and stenosis of bilateral carotid arteries: Secondary | ICD-10-CM | POA: Diagnosis not present

## 2023-06-24 DIAGNOSIS — E041 Nontoxic single thyroid nodule: Secondary | ICD-10-CM | POA: Diagnosis not present

## 2023-06-25 DIAGNOSIS — I6523 Occlusion and stenosis of bilateral carotid arteries: Secondary | ICD-10-CM | POA: Diagnosis not present

## 2023-06-26 DIAGNOSIS — R3912 Poor urinary stream: Secondary | ICD-10-CM | POA: Diagnosis not present

## 2023-06-26 DIAGNOSIS — N138 Other obstructive and reflux uropathy: Secondary | ICD-10-CM | POA: Diagnosis not present

## 2023-06-26 DIAGNOSIS — N401 Enlarged prostate with lower urinary tract symptoms: Secondary | ICD-10-CM | POA: Diagnosis not present

## 2023-06-27 DIAGNOSIS — Z23 Encounter for immunization: Secondary | ICD-10-CM | POA: Diagnosis not present

## 2023-07-14 DIAGNOSIS — I483 Typical atrial flutter: Secondary | ICD-10-CM | POA: Diagnosis not present

## 2023-07-28 DIAGNOSIS — R1310 Dysphagia, unspecified: Secondary | ICD-10-CM | POA: Diagnosis not present

## 2023-07-28 DIAGNOSIS — L814 Other melanin hyperpigmentation: Secondary | ICD-10-CM | POA: Diagnosis not present

## 2023-07-28 DIAGNOSIS — W908XXS Exposure to other nonionizing radiation, sequela: Secondary | ICD-10-CM | POA: Diagnosis not present

## 2023-07-28 DIAGNOSIS — K224 Dyskinesia of esophagus: Secondary | ICD-10-CM | POA: Diagnosis not present

## 2023-07-28 DIAGNOSIS — K219 Gastro-esophageal reflux disease without esophagitis: Secondary | ICD-10-CM | POA: Diagnosis not present

## 2023-07-28 DIAGNOSIS — L578 Other skin changes due to chronic exposure to nonionizing radiation: Secondary | ICD-10-CM | POA: Diagnosis not present

## 2023-07-28 DIAGNOSIS — Z129 Encounter for screening for malignant neoplasm, site unspecified: Secondary | ICD-10-CM | POA: Diagnosis not present

## 2023-07-28 DIAGNOSIS — R4702 Dysphasia: Secondary | ICD-10-CM | POA: Diagnosis not present

## 2023-07-28 DIAGNOSIS — Z86008 Personal history of in-situ neoplasm of other site: Secondary | ICD-10-CM | POA: Diagnosis not present

## 2023-08-07 DIAGNOSIS — F4310 Post-traumatic stress disorder, unspecified: Secondary | ICD-10-CM | POA: Diagnosis not present

## 2023-08-07 DIAGNOSIS — E785 Hyperlipidemia, unspecified: Secondary | ICD-10-CM | POA: Diagnosis not present

## 2023-08-07 DIAGNOSIS — K219 Gastro-esophageal reflux disease without esophagitis: Secondary | ICD-10-CM | POA: Diagnosis not present

## 2023-08-07 DIAGNOSIS — I2510 Atherosclerotic heart disease of native coronary artery without angina pectoris: Secondary | ICD-10-CM | POA: Diagnosis not present

## 2023-08-07 DIAGNOSIS — N401 Enlarged prostate with lower urinary tract symptoms: Secondary | ICD-10-CM | POA: Diagnosis not present

## 2023-08-07 DIAGNOSIS — R3912 Poor urinary stream: Secondary | ICD-10-CM | POA: Diagnosis not present

## 2023-08-07 DIAGNOSIS — Z955 Presence of coronary angioplasty implant and graft: Secondary | ICD-10-CM | POA: Diagnosis not present

## 2023-08-07 DIAGNOSIS — G40909 Epilepsy, unspecified, not intractable, without status epilepticus: Secondary | ICD-10-CM | POA: Diagnosis not present

## 2023-08-07 DIAGNOSIS — Z87891 Personal history of nicotine dependence: Secondary | ICD-10-CM | POA: Diagnosis not present

## 2023-08-07 DIAGNOSIS — D6851 Activated protein C resistance: Secondary | ICD-10-CM | POA: Diagnosis not present

## 2023-08-07 DIAGNOSIS — Z01812 Encounter for preprocedural laboratory examination: Secondary | ICD-10-CM | POA: Diagnosis not present

## 2023-08-07 DIAGNOSIS — Z9889 Other specified postprocedural states: Secondary | ICD-10-CM | POA: Diagnosis not present

## 2023-08-07 DIAGNOSIS — Z79899 Other long term (current) drug therapy: Secondary | ICD-10-CM | POA: Diagnosis not present

## 2023-08-07 DIAGNOSIS — J45909 Unspecified asthma, uncomplicated: Secondary | ICD-10-CM | POA: Diagnosis not present

## 2023-08-07 DIAGNOSIS — Z7951 Long term (current) use of inhaled steroids: Secondary | ICD-10-CM | POA: Diagnosis not present

## 2023-08-07 DIAGNOSIS — F418 Other specified anxiety disorders: Secondary | ICD-10-CM | POA: Diagnosis not present

## 2023-08-07 DIAGNOSIS — I350 Nonrheumatic aortic (valve) stenosis: Secondary | ICD-10-CM | POA: Diagnosis not present

## 2023-08-07 DIAGNOSIS — N138 Other obstructive and reflux uropathy: Secondary | ICD-10-CM | POA: Diagnosis not present

## 2023-08-07 DIAGNOSIS — G4730 Sleep apnea, unspecified: Secondary | ICD-10-CM | POA: Diagnosis not present

## 2023-08-07 DIAGNOSIS — Z86718 Personal history of other venous thrombosis and embolism: Secondary | ICD-10-CM | POA: Diagnosis not present

## 2023-08-07 DIAGNOSIS — K769 Liver disease, unspecified: Secondary | ICD-10-CM | POA: Diagnosis not present

## 2023-08-07 DIAGNOSIS — Z7982 Long term (current) use of aspirin: Secondary | ICD-10-CM | POA: Diagnosis not present

## 2023-08-07 DIAGNOSIS — Z7901 Long term (current) use of anticoagulants: Secondary | ICD-10-CM | POA: Diagnosis not present

## 2023-08-13 DIAGNOSIS — G4730 Sleep apnea, unspecified: Secondary | ICD-10-CM | POA: Diagnosis not present

## 2023-08-13 DIAGNOSIS — N138 Other obstructive and reflux uropathy: Secondary | ICD-10-CM | POA: Diagnosis not present

## 2023-08-13 DIAGNOSIS — J45909 Unspecified asthma, uncomplicated: Secondary | ICD-10-CM | POA: Diagnosis not present

## 2023-08-13 DIAGNOSIS — N401 Enlarged prostate with lower urinary tract symptoms: Secondary | ICD-10-CM | POA: Diagnosis not present

## 2023-08-13 DIAGNOSIS — K219 Gastro-esophageal reflux disease without esophagitis: Secondary | ICD-10-CM | POA: Diagnosis not present

## 2023-08-18 DIAGNOSIS — R112 Nausea with vomiting, unspecified: Secondary | ICD-10-CM | POA: Diagnosis not present

## 2023-08-18 DIAGNOSIS — N3000 Acute cystitis without hematuria: Secondary | ICD-10-CM | POA: Diagnosis not present

## 2023-08-18 DIAGNOSIS — Z7982 Long term (current) use of aspirin: Secondary | ICD-10-CM | POA: Diagnosis not present

## 2023-08-18 DIAGNOSIS — Z86711 Personal history of pulmonary embolism: Secondary | ICD-10-CM | POA: Diagnosis not present

## 2023-08-18 DIAGNOSIS — R059 Cough, unspecified: Secondary | ICD-10-CM | POA: Diagnosis not present

## 2023-08-18 DIAGNOSIS — D6851 Activated protein C resistance: Secondary | ICD-10-CM | POA: Diagnosis not present

## 2023-08-18 DIAGNOSIS — Z20822 Contact with and (suspected) exposure to covid-19: Secondary | ICD-10-CM | POA: Diagnosis not present

## 2023-08-18 DIAGNOSIS — R5383 Other fatigue: Secondary | ICD-10-CM | POA: Diagnosis not present

## 2023-08-18 DIAGNOSIS — N3090 Cystitis, unspecified without hematuria: Secondary | ICD-10-CM | POA: Diagnosis not present

## 2023-08-18 DIAGNOSIS — Z87891 Personal history of nicotine dependence: Secondary | ICD-10-CM | POA: Diagnosis not present

## 2023-08-18 DIAGNOSIS — R109 Unspecified abdominal pain: Secondary | ICD-10-CM | POA: Diagnosis not present

## 2023-08-18 DIAGNOSIS — I2510 Atherosclerotic heart disease of native coronary artery without angina pectoris: Secondary | ICD-10-CM | POA: Diagnosis not present

## 2023-08-18 DIAGNOSIS — I483 Typical atrial flutter: Secondary | ICD-10-CM | POA: Diagnosis not present

## 2023-08-18 DIAGNOSIS — N400 Enlarged prostate without lower urinary tract symptoms: Secondary | ICD-10-CM | POA: Diagnosis not present

## 2023-08-18 DIAGNOSIS — K219 Gastro-esophageal reflux disease without esophagitis: Secondary | ICD-10-CM | POA: Diagnosis not present

## 2023-08-18 DIAGNOSIS — Z79899 Other long term (current) drug therapy: Secondary | ICD-10-CM | POA: Diagnosis not present

## 2023-08-28 DIAGNOSIS — N401 Enlarged prostate with lower urinary tract symptoms: Secondary | ICD-10-CM | POA: Diagnosis not present

## 2023-08-28 DIAGNOSIS — N138 Other obstructive and reflux uropathy: Secondary | ICD-10-CM | POA: Diagnosis not present

## 2023-08-28 DIAGNOSIS — R3912 Poor urinary stream: Secondary | ICD-10-CM | POA: Diagnosis not present

## 2023-09-09 DIAGNOSIS — E041 Nontoxic single thyroid nodule: Secondary | ICD-10-CM | POA: Diagnosis not present

## 2023-09-10 DIAGNOSIS — K3184 Gastroparesis: Secondary | ICD-10-CM | POA: Diagnosis not present

## 2023-09-10 DIAGNOSIS — K5909 Other constipation: Secondary | ICD-10-CM | POA: Diagnosis not present

## 2023-09-10 DIAGNOSIS — D682 Hereditary deficiency of other clotting factors: Secondary | ICD-10-CM | POA: Diagnosis not present

## 2023-09-11 DIAGNOSIS — E041 Nontoxic single thyroid nodule: Secondary | ICD-10-CM | POA: Diagnosis not present

## 2023-09-21 DIAGNOSIS — I483 Typical atrial flutter: Secondary | ICD-10-CM | POA: Diagnosis not present

## 2023-09-22 DIAGNOSIS — K5900 Constipation, unspecified: Secondary | ICD-10-CM | POA: Diagnosis not present

## 2023-09-22 DIAGNOSIS — K3184 Gastroparesis: Secondary | ICD-10-CM | POA: Diagnosis not present

## 2023-09-22 DIAGNOSIS — Z860100 Personal history of colon polyps, unspecified: Secondary | ICD-10-CM | POA: Diagnosis not present

## 2023-09-22 DIAGNOSIS — R1310 Dysphagia, unspecified: Secondary | ICD-10-CM | POA: Diagnosis not present

## 2023-09-22 DIAGNOSIS — K219 Gastro-esophageal reflux disease without esophagitis: Secondary | ICD-10-CM | POA: Diagnosis not present

## 2023-09-22 DIAGNOSIS — R197 Diarrhea, unspecified: Secondary | ICD-10-CM | POA: Diagnosis not present

## 2023-09-30 DIAGNOSIS — Z86718 Personal history of other venous thrombosis and embolism: Secondary | ICD-10-CM | POA: Diagnosis not present

## 2023-09-30 DIAGNOSIS — G9050 Complex regional pain syndrome I, unspecified: Secondary | ICD-10-CM | POA: Diagnosis not present

## 2023-09-30 DIAGNOSIS — D682 Hereditary deficiency of other clotting factors: Secondary | ICD-10-CM | POA: Diagnosis not present

## 2023-09-30 DIAGNOSIS — E041 Nontoxic single thyroid nodule: Secondary | ICD-10-CM | POA: Diagnosis not present

## 2023-09-30 DIAGNOSIS — Z1330 Encounter for screening examination for mental health and behavioral disorders, unspecified: Secondary | ICD-10-CM | POA: Diagnosis not present

## 2023-10-21 DIAGNOSIS — M25551 Pain in right hip: Secondary | ICD-10-CM | POA: Diagnosis not present

## 2023-10-23 DIAGNOSIS — M1611 Unilateral primary osteoarthritis, right hip: Secondary | ICD-10-CM | POA: Diagnosis not present

## 2023-10-27 DIAGNOSIS — K5909 Other constipation: Secondary | ICD-10-CM | POA: Diagnosis not present

## 2023-10-27 DIAGNOSIS — D682 Hereditary deficiency of other clotting factors: Secondary | ICD-10-CM | POA: Diagnosis not present

## 2023-10-27 DIAGNOSIS — R1310 Dysphagia, unspecified: Secondary | ICD-10-CM | POA: Diagnosis not present

## 2023-10-27 DIAGNOSIS — K219 Gastro-esophageal reflux disease without esophagitis: Secondary | ICD-10-CM | POA: Diagnosis not present

## 2023-10-27 DIAGNOSIS — Z7901 Long term (current) use of anticoagulants: Secondary | ICD-10-CM | POA: Diagnosis not present

## 2023-10-27 DIAGNOSIS — K3184 Gastroparesis: Secondary | ICD-10-CM | POA: Diagnosis not present

## 2024-01-19 NOTE — Progress Notes (Unsigned)
 New Patient Note  RE: Frederick Payne MRN: [id number] DOB: Dec 24, 1960 Date of Office Visit: 01/20/2024  Consult requested by: Vivien Presto, MD Primary care provider: Vivien Presto, MD  Chief Complaint: No chief complaint on file.  History of Present Illness: I had the pleasure of seeing Frederick Payne for initial evaluation at the [facility] on 01/19/2024. He is a 63 y.o. male, who is referred here by Corrington, Kip A, MD for the evaluation of food allergies.  Discussed the use of AI scribe software for clinical note transcription with the patient, who gave verbal consent to proceed.  History of Present Illness             He reports food allergy to ***. The reaction occurred at the age of ***, after he ate *** amount of ***. Symptoms started within *** and was in the form of *** hives, swelling, wheezing, abdominal pain, diarrhea, vomiting. ***Denies any associated cofactors such as exertion, infection, NSAID use, or alcohol consumption. The symptoms lasted for ***. He was evaluated in ED and received ***. Since this episode, he does *** not report other accidental exposures to ***. He does *** not have access to epinephrine autoinjector and *** needed to use it.   Past work up includes: ***. Dietary History: patient has been eating other foods including ***milk, ***eggs, ***peanut, ***treenuts, ***sesame, ***shellfish, ***fish, ***soy, ***wheat, ***meats, ***fruits and ***vegetables.  He reports reading labels and avoiding *** in diet completely. He tolerates ***baked egg and baked milk products.   Assessment and Plan: Milik is a 63 y.o. male with: ***  Assessment and Plan               No follow-ups on file.  No orders of the defined types were placed in this encounter.  Lab Orders  No laboratory test(s) ordered today    Other allergy screening: Asthma: {Blank single:19197::"yes","no"} Rhino conjunctivitis: {Blank  single:19197::"yes","no"} Food allergy: {Blank single:19197::"yes","no"} Medication allergy: {Blank single:19197::"yes","no"} Hymenoptera allergy: {Blank single:19197::"yes","no"} Urticaria: {Blank single:19197::"yes","no"} Eczema:{Blank single:19197::"yes","no"} History of recurrent infections suggestive of immunodeficency: {Blank single:19197::"yes","no"}  Diagnostics: Spirometry:  Tracings reviewed. His effort: {Blank single:19197::"Good reproducible efforts.","It was hard to get consistent efforts and there is a question as to whether this reflects a maximal maneuver.","Poor effort, data can not be interpreted."} FVC: ***L FEV1: ***L, ***% predicted FEV1/FVC ratio: ***% Interpretation: {Blank single:19197::"Spirometry consistent with mild obstructive disease","Spirometry consistent with moderate obstructive disease","Spirometry consistent with severe obstructive disease","Spirometry consistent with possible restrictive disease","Spirometry consistent with mixed obstructive and restrictive disease","Spirometry uninterpretable due to technique","Spirometry consistent with normal pattern","No overt abnormalities noted given today's efforts"}.  Please see scanned spirometry results for details.  Skin Testing: {Blank single:19197::"Select foods","Environmental allergy panel","Environmental allergy panel and select foods","Food allergy panel","None","Deferred due to recent antihistamines use"}. *** Results discussed with patient/family.   Past Medical History: Patient Active Problem List   Diagnosis Date Noted  . History of squamous cell carcinoma of skin 02/19/2022  . Syncope and collapse 02/19/2022  . Benign paroxysmal positional vertigo 02/19/2022  . Chronic anticoagulation 02/19/2022  . Hyperlipidemia 04/10/2021  . Cervical spondylosis 05/25/2020  . Erectile dysfunction due to arterial insufficiency 02/16/2020  . Presbycusis of both ears 02/16/2020  . OSA (obstructive sleep apnea)  06/21/2019  . Snoring 06/16/2019  . B12 deficiency 02/14/2016  . Alcoholic liver disease (HCC) 01/23/2016  . History of alcohol abuse 09/19/2015  . Thyroid nodule 08/29/2015  . Thrombocytopenia (HCC) 07/25/2014  . Asthma with allergic rhinitis 02/08/2014  .  Allergic rhinitis 02/08/2014  . Anemia 02/08/2014  . Complex regional pain syndrome i of left lower limb 02/08/2014  . Opioid dependence in remission (HCC) 08/18/2013  . Depression, major, in remission (HCC) 03/03/2013  . PTSD (post-traumatic stress disorder) 03/03/2013  . Postphlebitic syndrome 02/25/2013  . History of DVT (deep vein thrombosis) 01/20/2013  . Anticoagulation goal of INR 2.5 to 3.5 09/09/2012  . S/P AVR 08/17/2012  . Typical atrial flutter (HCC) 08/17/2012  . Other primary thrombophilia (HCC) 08/17/2012  . Factor V deficiency (HCC) 08/17/2012  . S/P CABG x 1 07/23/2012  . Long term (current) use of anticoagulants 07/21/2012  . S/P cardiac cath 05/06/2012  . Disorder of copper metabolism 12/26/2011  . Personal history of pulmonary embolism 07/22/2011   Past Medical History:  Diagnosis Date  . Aortic stenosis    Had surgery performed  . CAD (coronary artery disease)   . Chronic anemia   . Deep vein thrombosis (HCC) 2006   After a broken ankle--recently diagnosed with reflex sympathetic dystrophy of the left lower extremity  . Elevated LFTs    h/o excessive alcohol  . Factor V Leiden (HCC)   . Hypertension   . Ischemic heart disease   . Psychiatric disorder    Underlying  . PUD (peptic ulcer disease)    with bleeding  . Reflex sympathetic dystrophy    Past Surgical History: Past Surgical History:  Procedure Laterality Date  . AORTIC VALVE REPLACEMENT  07/15/12   27 mm St Jude  . CORONARY ARTERY BYPASS GRAFT     x1  . GALLBLADDER SURGERY  2008  . gastrectomy and vagotomy    . greenfield filter     Medication List:  Current Outpatient Medications  Medication Sig Dispense Refill  . albuterol  (VENTOLIN HFA) 108 (90 Base) MCG/ACT inhaler INHALE 2 PUFFS BY MOUTH EVERY 4 TO 6 HOURS AS NEEDED 18 g 0  . ARNUITY ELLIPTA 100 MCG/ACT AEPB INHALE 1 PUFF INTO THE LUNGS DAILY 30 each 11  . ascorbic acid (VITAMIN C) 500 MG tablet Take 500 mg by mouth daily.    [name] aspirin 81 MG tablet Take 81 mg by mouth daily.    Dewaine Conger LANCETS MISC 1 each by Does not apply route as directed. 200 each 1  . DHA-PHOSPHATIDYLSERINE PO Take 500 mg by mouth.    . FEROSUL 325 (65 Fe) MG tablet TAKE 1 TABLET BY MOUTH EVERY DAY 90 tablet 0  . fish oil-omega-3 fatty acids 1000 MG capsule Take 1 g by mouth daily.    . iron polysaccharides (NIFEREX) 150 MG capsule TAKE 1 CAPSULE BY MOUTH EVERY DAY 30 capsule 0  . L-Methylfolate-Algae (DEPLIN 15) 15-90.314 MG CAPS Take by mouth.    . Multiple Vitamins-Minerals (ONE-A-DAY MENS 50+ ADVANTAGE PO) Take by mouth daily.    [name] OVER THE COUNTER MEDICATION     . PT/INR Test (COAGUCHEK PT TEST) STRP 1 each by In Vitro route as directed. 48 each 1  . PT/INR Testing Monitor (COAGUCHEK XS PLUS SYSTEM) KIT 1 each by Does not apply route as directed. 1 kit 0  . S-Adenosylmethionine (SAM-E) 400 MG TABS Take 1 tablet by mouth daily.    . St Johns Wort 300 MG CAPS Take 300 mg by mouth 2 (two) times daily.    . tadalafil (CIALIS) 5 MG tablet Take 5 mg by mouth daily.    . Thiamine HCl (B-1) 100 MG TABS Take by mouth.    . warfarin (  COUMADIN) 10 MG tablet TAKE 1 TABLET(10 MG) BY MOUTH DAILY 90 tablet 0  . warfarin (COUMADIN) 2 MG tablet TAKE 1 TABLET BY MOUTH DAILY 30 tablet 2  . warfarin (COUMADIN) 5 MG tablet TAKE 1 TABLET BY MOUTH DAILY 90 tablet 1   No current facility-administered medications for this visit.   Allergies: Allergies  Allergen Reactions  . Benzodiazepines Other (See Comments)    Causes pt to get violent   . Contrast Media [Iodinated Contrast Media] Hives  . Iodine Anaphylaxis  . Other Anaphylaxis    FRUITS FROM TREES.   . Pectin Anaphylaxis  . Shellfish  Allergy Anaphylaxis  . Sulfa Antibiotics Anaphylaxis  . Diazepam Other (See Comments)    Headache, strange behavior, memory loss  . Lactose Intolerance (Gi) Nausea And Vomiting   Social History: Social History   Socioeconomic History  . Marital status: Married    Spouse name: Not on file  . Number of children: 2  . Years of education: Not on file  . Highest education level: Not on file  Occupational History  . Occupation: finance-disabled  Tobacco Use  . Smoking status: Former  . Smokeless tobacco: Never  . Tobacco comments:    quit 15 + yrs ago  Substance and Sexual Activity  . Alcohol use: No    Alcohol/week: 0.0 standard drinks of alcohol  . Drug use: No  . Sexual activity: Yes    Partners: Male  Other Topics Concern  . Not on file  Social History Narrative   Lives at home w/ his partner, Susann Givens and his elderly mother   Right-handed   Drinks about 3 cups of tea per day   Social Drivers of Health   Financial Resource Strain: Low Risk  (11/07/2023)   Received from [facility]   Overall Financial Resource Strain (CARDIA)   . Difficulty of Paying Living Expenses: Not hard at all  Food Insecurity: No Food Insecurity (11/07/2023)   [facility]   Hunger Vital Sign   . Worried About Programme researcher, broadcasting/film/video in the Last Year: Never true   . Ran Out of Food in the Last Year: Never true  Transportation Needs: No Transportation Needs (11/07/2023)   Received from [facility] - Transportation   . Lack of Transportation (Medical): No   . Lack of Transportation (Non-Medical): No  Physical Activity: Unknown (06/09/2023)   [facility]   Exercise Vital Sign   . Days of Exercise per Week: 7 days   . Minutes of Exercise per Session: Not on file  Stress: No Stress Concern Present (06/09/2023)   Received from [facility] - Occupational Stress Questionnaire   . Feeling of Stress : Only a little   Social Connections: Socially Integrated (06/09/2023)   Received from [facility]   Social Network   . How would you rate your social network (family, work, friends)?: Good participation with social networks   Lives in a ***. Smoking: *** Occupation: ***  Environmental HistorySurveyor, minerals in the house: Copywriter, advertising in the family room: {Blank single:19197::"yes","no"} Carpet in the bedroom: {Blank single:19197::"yes","no"} Heating: {Blank single:19197::"electric","gas","heat pump"} Cooling: {Blank single:19197::"central","window","heat pump"} Pet: {Blank single:19197::"yes ***","no"}  Family History: Family History  Problem Relation Age of Onset  . Healthy Father   . Stroke Paternal Grandmother    Problem  Relation Asthma                                   *** Eczema                                *** Food allergy                          *** Allergic rhino conjunctivitis     ***  Review of Systems  Constitutional:  Negative for appetite change, chills, fever and unexpected weight change.  HENT:  Negative for congestion and rhinorrhea.   Eyes:  Negative for itching.  Respiratory:  Negative for cough, chest tightness, shortness of breath and wheezing.   Cardiovascular:  Negative for chest pain.  Gastrointestinal:  Negative for abdominal pain.  Genitourinary:  Negative for difficulty urinating.  Skin:  Negative for rash.  Neurological:  Negative for headaches.   Objective: There were no vitals taken for this visit. There is no height or weight on file to calculate BMI. Physical Exam Vitals and nursing note reviewed.  Constitutional:      Appearance: Normal appearance. He is well-developed.  HENT:     Head: Normocephalic and atraumatic.     Right Ear: Tympanic membrane and external ear normal.     Left Ear: Tympanic membrane and external ear normal.     Nose: Nose normal.     Mouth/Throat:     Mouth:  Mucous membranes are moist.     Pharynx: Oropharynx is clear.  Eyes:     Conjunctiva/sclera: Conjunctivae normal.  Cardiovascular:     Rate and Rhythm: Normal rate and regular rhythm.     Heart sounds: Normal heart sounds. No murmur heard.    No friction rub. No gallop.  Pulmonary:     Effort: Pulmonary effort is normal.     Breath sounds: Normal breath sounds. No wheezing, rhonchi or rales.  Musculoskeletal:     Cervical back: Neck supple.  Skin:    General: Skin is warm.     Findings: No rash.  Neurological:     Mental Status: He is alert and oriented to person, place, and time.  Psychiatric:        Behavior: Behavior normal.  The plan was reviewed with the patient/family, and all questions/concerned were addressed.  It was my pleasure to see Jawanza today and participate in his care. Please feel free to contact me with any questions or concerns.  Sincerely,  Wyline Mood, DO Allergy & Immunology  [facility] office: [phone] [facility] office: [phone]

## 2024-01-20 ENCOUNTER — Ambulatory Visit (INDEPENDENT_AMBULATORY_CARE_PROVIDER_SITE_OTHER): Admitting: Allergy

## 2024-01-20 ENCOUNTER — Encounter: Payer: Self-pay | Admitting: Allergy

## 2024-01-20 ENCOUNTER — Other Ambulatory Visit: Payer: Self-pay

## 2024-01-20 VITALS — BP 130/70 | HR 71 | Temp 98.3°F | Resp 12 | Ht 67.72 in | Wt 168.5 lb

## 2024-01-20 DIAGNOSIS — J4520 Mild intermittent asthma, uncomplicated: Secondary | ICD-10-CM | POA: Diagnosis not present

## 2024-01-20 DIAGNOSIS — L2089 Other atopic dermatitis: Secondary | ICD-10-CM

## 2024-01-20 DIAGNOSIS — K200 Eosinophilic esophagitis: Secondary | ICD-10-CM | POA: Diagnosis not present

## 2024-01-20 DIAGNOSIS — R198 Other specified symptoms and signs involving the digestive system and abdomen: Secondary | ICD-10-CM

## 2024-01-20 DIAGNOSIS — T7840XD Allergy, unspecified, subsequent encounter: Secondary | ICD-10-CM

## 2024-01-20 DIAGNOSIS — T781XXD Other adverse food reactions, not elsewhere classified, subsequent encounter: Secondary | ICD-10-CM | POA: Diagnosis not present

## 2024-01-20 DIAGNOSIS — H1013 Acute atopic conjunctivitis, bilateral: Secondary | ICD-10-CM

## 2024-01-20 DIAGNOSIS — J3089 Other allergic rhinitis: Secondary | ICD-10-CM

## 2024-01-20 NOTE — Patient Instructions (Addendum)
 Return for allergy skin testing. Will make additional recommendations based on results. Make sure you don't take any antihistamines for 3 days before the skin testing appointment. Don't put any lotion on the back and arms on the day of testing.  Plan on being here for 30-60 minutes.   Eosinophilic esophagitis Continue to see GI as scheduled. Continue Pulmicort vials twice a day. Nothing to eat or drink for 30 minutes afterwards.  Consider starting Dupixent 300mg  once a week injections - handout given. Tammy will be in touch with coverage.   Discussed with patient that EoE is a T cell mediated process and skin prick testing does not necessarily identity EoE food triggers. However, studies have shown that milk, gluten, eggs and peanuts/tree nuts are major EoE triggers.  Gave handout on eosinophilic esophagitis.  Food allergies Continue to avoid eggs, peanuts, wheat, dairy, soy, walnuts, coconut, corn, sesame. For mild symptoms you can take over the counter antihistamines such as Benadryl 1-2 tablets = 25-50mg  and monitor symptoms closely. If symptoms worsen or if you have severe symptoms including breathing issues, throat closure, significant swelling, whole body hives, severe diarrhea and vomiting, lightheadedness then inject epinephrine and seek immediate medical care afterwards. Emergency action plan given.  Environmental allergies Stop SLIT due to your EoE. Once your GI issues are stable will consider environmental allergy testing and starting allergy shots - handout given.  Use over the counter antihistamines such as Zyrtec (cetirizine), Claritin (loratadine), Allegra (fexofenadine), or Xyzal (levocetirizine) daily as needed. May take twice a day during allergy flares. May switch antihistamines every few months. Start Singulair (montelukast) 10mg  daily at night. Cautioned that in some children/adults can experience behavioral changes including hyperactivity, agitation, depression, sleep  disturbances and suicidal ideations. These side effects are rare, but if you notice them you should notify me and discontinue Singulair (montelukast). Use Flonase (fluticasone) nasal spray 1-2 sprays per nostril once a day as needed for nasal congestion.  Nasal saline spray (i.e., Simply Saline) or nasal saline lavage (i.e., NeilMed) is recommended as needed and prior to medicated nasal sprays. Use azelastine nasal spray 1-2 sprays per nostril twice a day as needed for runny nose/drainage. Stop 3 days before skin testing.   Breathing Today's breathing test was unremarkable. May use albuterol rescue inhaler 2 puffs every 4 to 6 hours as needed for shortness of breath, chest tightness, coughing, and wheezing. May use albuterol rescue inhaler 2 puffs 5 to 15 minutes prior to strenuous physical activities. Monitor frequency of use - if you need to use it more than twice per week on a consistent basis let us know.   Follow up for skin testing.

## 2024-01-23 ENCOUNTER — Telehealth: Payer: Self-pay | Admitting: *Deleted

## 2024-01-23 NOTE — Telephone Encounter (Signed)
-----   Message from Ellamae Sia sent at 01/20/2024  3:02 PM EDT ----- Please start PA for Dupixent 300mg  injection weekly for EoE.

## 2024-01-23 NOTE — Telephone Encounter (Signed)
 L/m for patient need his PDP info for Dupixent

## 2024-01-26 NOTE — Progress Notes (Unsigned)
 Skin testing note  RE: Frederick Payne MRN: [id number] DOB: 1960-12-19 Date of Office Visit: 01/27/2024  Referring provider: Vivien Presto, MD Primary care provider: Vivien Presto, MD  Chief Complaint: skin testing  History of Present Illness: I had the pleasure of seeing Frederick Payne for a skin testing visit at the [facility] on 01/27/2024. He is a 63 y.o. male, who is being followed for adverse food reactions, allergic reactions, EOE, allergic rhinoconjunctivitis, asthma. His previous allergy office visit was on 01/20/2024 with Dr. Selena Batten. Today is a skin testing visit.   Discussed the use of AI scribe software for clinical note transcription with the patient, who gave verbal consent to proceed.    Frederick Payne is a 63 year old male with eosinophilic esophagitis who presents with food allergies and dietary concerns.  He has multiple food allergies confirmed by positive skin test results today, including peanuts, soy, almond, hazelnut, oat, white potatoes, sweet potatoes, navy bean, green beans, carrots, cherry, and sesame. He has eliminated these foods from his diet for the past three weeks. He experiences anaphylactic reactions to coconut, requiring hospitalization, and reports syncope after consuming corn. He is currently avoiding milk, egg, wheat, corn, coconut, peanuts, tree nuts, and certain vegetables.  He has eosinophilic esophagitis and notes improvement in symptoms such as normal bowel movements after eliminating milk, egg, and wheat from his diet. Milk causes vomiting and tremors, while wheat avoidance has also improved his symptoms.  He has not started Singulair as he has not picked up the prescription yet. He has stopped using sublingual immunotherapy and reports feeling better since discontinuing it. He has Flonase and azelastine available for use as needed. He has one EpiPen left, which is almost a year old.  He has not yet seen a GI doctor but has  an appointment scheduled for June 12th.      Assessment and Plan: Frederick Payne is a 63 y.o. male with: Eosinophilic esophagitis Gastrointestinal complaints Other adverse food reactions, not elsewhere classified, subsequent encounter Oral allergy syndrome Past history - EOE confirmed by endoscopy with 35 eosinophils/HPF. Symptoms improved with diet and Pulmicort slurry BID. Used to be on PPI as well. 2025 labs showed multiple positives and avoiding foods which helping but still having some GI symptoms. He experiences anaphylactic reactions to coconut, requiring hospitalization, and reports syncope after consuming corn. Milk causes vomiting and tremors. Feels better with avoiding milk, egg and wheat.  Today's skin testing positive to peanut, soy, almond, hazelnut, oat, white potatoes, sweet potato, navy bean, green beans, carrots and cherry. Borderline to sesame.  Discussed with patient that EoE is a T cell mediated process and skin prick testing does not necessarily identity EoE food triggers. However, studies have shown that milk, gluten, eggs and peanuts/tree nuts are major EoE triggers. Difficult to discern how much of the above positive food results are actually contributing to his symptoms. Apparently he consumes potatoes with no immediate effects.  Start strict avoidance of peanut, soy, almond, hazelnut, oat, white potatoes, sweet potato, navy bean, green beans, carrots and cherry. Borderline to sesame.  Continue to avoid milk, egg, wheat, coconut, corn.  Okay to reintroduce soy and sesame slowly back into your diet.  Continue to see GI as scheduled. Continue Pulmicort vials twice a day. Nothing to eat or drink for 30 minutes afterwards.  Consider starting Dupixent 300mg  once a week injections. Get bloodwork to rule out alpha gal allergy and mast cell issues.  I have  prescribed epinephrine injectable device For mild symptoms you can take over the counter antihistamines such as Benadryl 1-2  tablets = 25-50mg  and monitor symptoms closely. If symptoms worsen or if you have severe symptoms including breathing issues, throat closure, significant swelling, whole body hives, severe diarrhea and vomiting, lightheadedness then inject epinephrine and seek immediate medical care afterwards. Emergency action plan given. Discussed that some of his food triggered oral and throat symptoms are likely caused by oral food allergy syndrome (OFAS). This is caused by cross reactivity of pollen with fresh fruits and vegetables, and nuts. Symptoms are usually localized in the form of itching and burning in mouth and throat. Very rarely it can progress to more severe symptoms. Eating foods in cooked or processed forms usually minimizes symptoms. I recommended avoidance of eating the problem foods, especially during the peak season(s). Sometimes, OFAS can induce severe throat swelling or even a systemic reaction; with such instance, I advised them to report to a local ER. A list of common pollens and food cross-reactivities was provided to the patient.   Other allergic rhinitis Allergic conjunctivitis of both eyes Past history - on SLIT for 6+ months. Flu-like symptoms managed with Astelin and Singulair.  Interim history - feels better with stopping SLIT. Once your GI issues are stable will consider environmental allergy testing and starting allergy shots. Use over the counter antihistamines such as Zyrtec (cetirizine), Claritin (loratadine), Allegra (fexofenadine), or Xyzal (levocetirizine) daily as needed. May take twice a day during allergy flares. May switch antihistamines every few months. Start Singulair (montelukast) 10mg  daily at night. Cautioned that in some children/adults can experience behavioral changes including hyperactivity, agitation, depression, sleep disturbances and suicidal ideations. These side effects are rare, but if you notice them you should notify me and discontinue Singulair  (montelukast). Use Flonase (fluticasone) nasal spray 1-2 sprays per nostril once a day as needed for nasal congestion.  Nasal saline spray (i.e., Simply Saline) or nasal saline lavage (i.e., NeilMed) is recommended as needed and prior to medicated nasal sprays. Use azelastine nasal spray 1-2 sprays per nostril twice a day as needed for runny nose/drainage.   Mild intermittent asthma without complication Past history - Used to see pulmonology in the past. And only using albuterol as needed. Sometimes has seasonal exacerbations. 2025 spirometry was unremarkable.  May use albuterol rescue inhaler 2 puffs every 4 to 6 hours as needed for shortness of breath, chest tightness, coughing, and wheezing. May use albuterol rescue inhaler 2 puffs 5 to 15 minutes prior to strenuous physical activities. Monitor frequency of use - if you need to use it more than twice per week on a consistent basis let us know.   Return in about 3 months (around 04/27/2024).  Meds ordered this encounter  Medications   EPINEPHrine 0.3 mg/0.3 mL IJ SOAJ injection    Sig: Inject 0.3 mg into the muscle as needed for anaphylaxis.    Dispense:  2 each    Refill:  1    May dispense generic/Mylan/Teva brand.   montelukast (SINGULAIR) 10 MG tablet    Sig: Take 1 tablet (10 mg total) by mouth at bedtime.    Dispense:  30 tablet    Refill:  5   Lab Orders         Tryptase         Alpha-Gal Panel      Diagnostics: Skin Testing: Food allergy panel. Today's skin testing positive to peanut, soy, almond, hazelnut, oat, white potatoes, sweet potato, navy  bean, green beans, carrots and cherry. Borderline to sesame.  Results discussed with patient/family.  Food Adult Perc - 01/27/24 0900     Time Antigen Placed 0957    Allergen Manufacturer Waynette Buttery    Location Back    Number of allergen test 72     Control-buffer 50% Glycerol Negative    Control-Histamine 3+    1. Peanut --   4x3   2. Soybean --   6x6   3. Wheat Negative    4.  Sesame --   +/-   5. Milk, Cow Negative    6. Casein Negative    7. Egg White, Chicken Negative    8. Shellfish Mix Negative    9. Fish Mix Negative    10. Cashew Negative    11. Walnut Food Negative    12. Almond --   5x5   13. Hazelnut --   10x8   14. Pecan Food Negative    15. Pistachio Negative    16. [place] Nut Negative    17. Coconut Negative    18. Trout Negative    19. Tuna Negative    20. Salmon Negative    21. Flounder Negative    22. Codfish Negative    23. Shrimp Negative    24. Crab Negative    25. Lobster Negative    26. Oyster Negative    27. Scallops Negative    28. Oat  --   4x4   29. Rice Negative    30. Barley Negative    31. Rye  Negative    32. Hops Negative    33. [place] Meat Negative    34. Chicken Meat Negative    35. Pork Negative    36. Beef Negative    37. Lamb Negative    38. Tomato Negative    39. White Potato --   6x6   40. Sweet Potato --   7x7   41. Pea, Green/English Negative    42. Navy Bean --   6x4   43. Green Beans --   6x6   44. Squash Negative    45. Green Pepper Negative    46. Mushrooms Negative    47. Onion Negative    48. Avocado Negative    49. Cabbage Negative    50. Carrots --   8x6   51. Celery Negative    52. Corn Negative    53. Cucumber Negative    54. Grape (White seedless) Negative    55. Orange  Negative    56. Lemon Negative    57. Banana Negative    58. Apple Negative    59. Peach Negative    60. Strawberry Negative    61. Blueberry Negative    62. Cherry --   8x7   63. Cantaloupe Negative    64. Watermelon Negative    65. Pineapple Negative    66. Chocolate/Cacao Bean Negative    67. Cinnamon Negative    68. Nutmeg Negative    69. Ginger Negative    70. Garlic Negative    71. Pepper, Black Negative    72. Mustard Negative             Previous notes and tests were reviewed. The plan was reviewed with the patient/family, and all questions/concerned were addressed.  It was my pleasure  to see Frederick Payne today and participate in his care. Please feel free to contact me with any questions or concerns.  Sincerely,  Wyline Mood, DO Allergy & Immunology  [facility] office: [phone] [facility] office: [phone]

## 2024-01-27 ENCOUNTER — Other Ambulatory Visit: Payer: Self-pay

## 2024-01-27 ENCOUNTER — Other Ambulatory Visit (HOSPITAL_COMMUNITY): Payer: Self-pay

## 2024-01-27 ENCOUNTER — Encounter: Payer: Self-pay | Admitting: Allergy

## 2024-01-27 ENCOUNTER — Ambulatory Visit (INDEPENDENT_AMBULATORY_CARE_PROVIDER_SITE_OTHER): Admitting: Allergy

## 2024-01-27 DIAGNOSIS — J3089 Other allergic rhinitis: Secondary | ICD-10-CM

## 2024-01-27 DIAGNOSIS — H1013 Acute atopic conjunctivitis, bilateral: Secondary | ICD-10-CM

## 2024-01-27 DIAGNOSIS — L2089 Other atopic dermatitis: Secondary | ICD-10-CM

## 2024-01-27 DIAGNOSIS — J4520 Mild intermittent asthma, uncomplicated: Secondary | ICD-10-CM

## 2024-01-27 DIAGNOSIS — K200 Eosinophilic esophagitis: Secondary | ICD-10-CM

## 2024-01-27 DIAGNOSIS — T781XXD Other adverse food reactions, not elsewhere classified, subsequent encounter: Secondary | ICD-10-CM | POA: Diagnosis not present

## 2024-01-27 DIAGNOSIS — R198 Other specified symptoms and signs involving the digestive system and abdomen: Secondary | ICD-10-CM

## 2024-01-27 MED ORDER — MONTELUKAST SODIUM 10 MG PO TABS: 10.0000 mg | ORAL_TABLET | Freq: Every day | ORAL | 5 refills | Status: DC

## 2024-01-27 MED ORDER — DUPIXENT 300 MG/2ML ~~LOC~~ SOSY
300.0000 mg | PREFILLED_SYRINGE | SUBCUTANEOUS | 11 refills | Status: AC
  Filled 2024-01-29: qty 8, 28d supply, fill #0
  Filled 2024-02-17: qty 8, 28d supply, fill #1
  Filled 2024-03-17: qty 8, 28d supply, fill #2
  Filled 2024-04-19: qty 8, 28d supply, fill #3
  Filled 2024-05-17 – 2024-05-18 (×2): qty 8, 28d supply, fill #4
  Filled 2024-06-09 – 2024-06-29 (×3): qty 8, 28d supply, fill #5
  Filled 2024-07-05 – 2024-07-27 (×2): qty 8, 28d supply, fill #6
  Filled 2024-08-23: qty 8, 28d supply, fill #7
  Filled 2024-09-16: qty 8, 28d supply, fill #8
  Filled 2024-10-19 – 2024-10-20 (×2): qty 8, 28d supply, fill #9
  Filled 2024-11-22 – 2024-12-02 (×5): qty 8, 28d supply, fill #10

## 2024-01-27 MED ORDER — EPINEPHRINE 0.3 MG/0.3ML IJ SOAJ
0.3000 mg | INTRAMUSCULAR | 1 refills | Status: DC | PRN
Start: 2024-01-27 — End: 2024-08-19

## 2024-01-27 NOTE — Telephone Encounter (Signed)
 Spoke to patient and discussed affordability for Dupixent, He is already paying large amounts for his medications and will not qualify for patient assistance due to income. Will get approval and send to pharmacy to find out his copay

## 2024-01-27 NOTE — Telephone Encounter (Signed)
 Called patient and advised $0 copay probably has met OOP for year and [facility] will send to [facility] to get patient scheduled for first injection and p/u remaining doses to do at home. I will be in touch to make that appt

## 2024-01-27 NOTE — Patient Instructions (Addendum)
 Today's skin testing positive to peanut, soy, almond, hazelnut, oat, white potatoes, sweet potato, navy bean, green beans, carrots and cherry. Borderline to sesame.   Results given.  Food allergies Start strict avoidance of peanut, soy, almond, hazelnut, oat, white potatoes, sweet potato, navy bean, green beans, carrots and cherry. Borderline to sesame.  Continue to avoid milk, egg, wheat, coconut, corn.  Okay to reintroduce soy and sesame slowly back into your diet.  I have prescribed epinephrine injectable device For mild symptoms you can take over the counter antihistamines such as Benadryl 1-2 tablets = 25-50mg  and monitor symptoms closely. If symptoms worsen or if you have severe symptoms including breathing issues, throat closure, significant swelling, whole body hives, severe diarrhea and vomiting, lightheadedness then inject epinephrine and seek immediate medical care afterwards. Emergency action plan given.  Discussed that some of his food triggered oral and throat symptoms are likely caused by oral food allergy syndrome (OFAS). This is caused by cross reactivity of pollen with fresh fruits and vegetables, and nuts. Symptoms are usually localized in the form of itching and burning in mouth and throat. Very rarely it can progress to more severe symptoms. Eating foods in cooked or processed forms usually minimizes symptoms. I recommended avoidance of eating the problem foods, especially during the peak season(s). Sometimes, OFAS can induce severe throat swelling or even a systemic reaction; with such instance, I advised them to report to a local ER. A list of common pollens and food cross-reactivities was provided to the patient.   Eosinophilic esophagitis Continue to see GI as scheduled. Continue Pulmicort vials twice a day. Nothing to eat or drink for 30 minutes afterwards.  Consider starting Dupixent 300mg  once a week injections. Call Tammy - [phone]   Discussed with patient that  EoE is a T cell mediated process and skin prick testing does not necessarily identity EoE food triggers. However, studies have shown that milk, gluten, eggs and peanuts/tree nuts are major EoE triggers.   Environmental allergies Once your GI issues are stable will consider environmental allergy testing and starting allergy shots. Use over the counter antihistamines such as Zyrtec (cetirizine), Claritin (loratadine), Allegra (fexofenadine), or Xyzal (levocetirizine) daily as needed. May take twice a day during allergy flares. May switch antihistamines every few months. Start Singulair (montelukast) 10mg  daily at night. Cautioned that in some children/adults can experience behavioral changes including hyperactivity, agitation, depression, sleep disturbances and suicidal ideations. These side effects are rare, but if you notice them you should notify me and discontinue Singulair (montelukast). Use Flonase (fluticasone) nasal spray 1-2 sprays per nostril once a day as needed for nasal congestion.  Nasal saline spray (i.e., Simply Saline) or nasal saline lavage (i.e., NeilMed) is recommended as needed and prior to medicated nasal sprays. Use azelastine nasal spray 1-2 sprays per nostril twice a day as needed for runny nose/drainage.  Breathing May use albuterol rescue inhaler 2 puffs every 4 to 6 hours as needed for shortness of breath, chest tightness, coughing, and wheezing. May use albuterol rescue inhaler 2 puffs 5 to 15 minutes prior to strenuous physical activities. Monitor frequency of use - if you need to use it more than twice per week on a consistent basis let us know.   Get bloodwork to rule out alpha gal allergy and mast cell issues.  We are ordering labs, so please allow 1-2 weeks for the results to come back. With the newly implemented Cures Act, the labs might be visible to you at the same time  that they become visible to me. However, I will not address the results until all of the results  are back, so please be patient.  In the meantime, continue recommendations in your patient instructions, including avoidance measures (if applicable), until you hear from me.   Return in about 3 months (around 04/27/2024). Or sooner if needed.

## 2024-01-29 ENCOUNTER — Other Ambulatory Visit: Payer: Self-pay

## 2024-01-29 NOTE — Progress Notes (Signed)
 Specialty Pharmacy Initiation Note   Frederick Payne is a 63 y.o. male who will be followed by the specialty pharmacy service for RxSp Allergy    Review of administration, indication, effectiveness, safety, potential side effects, storage/disposable, and missed dose instructions occurred today for patient's specialty medication(s) Dupilumab (Dupixent)     Patient/Caregiver did not have any additional questions or concerns.   Patient's therapy is appropriate to: Initiate    Goals Addressed             This Visit's Progress    Minimize recurrence of flares       Patient is initiating therapy. Patient will maintain adherence         [name] Specialty Pharmacist

## 2024-01-29 NOTE — Progress Notes (Signed)
 Specialty Pharmacy Initial Fill Coordination Note  Frederick Payne is a 63 y.o. male contacted today regarding initial fill of specialty medication(s) Dupilumab (Dupixent)   Patient requested Courier to Provider Office   Delivery date: 02/03/24   Verified address: [address] [place], [place] [address]   Medication will be filled on 02/02/2024.   Patient is aware of $0.00 copayment.

## 2024-02-02 ENCOUNTER — Other Ambulatory Visit: Payer: Self-pay

## 2024-02-05 ENCOUNTER — Ambulatory Visit (INDEPENDENT_AMBULATORY_CARE_PROVIDER_SITE_OTHER)

## 2024-02-05 ENCOUNTER — Telehealth: Payer: Self-pay | Admitting: *Deleted

## 2024-02-05 DIAGNOSIS — K200 Eosinophilic esophagitis: Secondary | ICD-10-CM | POA: Diagnosis not present

## 2024-02-05 MED ORDER — DUPILUMAB 300 MG/2ML ~~LOC~~ SOSY
300.0000 mg | PREFILLED_SYRINGE | Freq: Once | SUBCUTANEOUS | Status: AC
  Administered 2024-02-05: 300 mg via SUBCUTANEOUS

## 2024-02-05 NOTE — Telephone Encounter (Signed)
-----   Message from [facility] Diandra D sent at 02/05/2024 11:24 AM EDT ----- Patient came in for Dupixent with at home teaching. He got 2 boxes of the Dupixent pens sent to his home and we received 2 boxes of the syringes sent to the [place] office. I went over how to apply them both, but the patient wants to receive only the syringes for future injections. Please advise.    Patient gets Dupixent every 7 days for EOE

## 2024-02-05 NOTE — Telephone Encounter (Signed)
 Seems [facility] was working on getting patient on Dupixent at same time I was and got delivery from both. I advised patient he will need to d/c the other rx for pens which he doesn't like to stay on rx for syringe through [facility] and reorder from same

## 2024-02-09 ENCOUNTER — Other Ambulatory Visit: Payer: Self-pay

## 2024-02-12 ENCOUNTER — Ambulatory Visit

## 2024-02-12 DIAGNOSIS — K200 Eosinophilic esophagitis: Secondary | ICD-10-CM

## 2024-02-12 MED ORDER — DUPILUMAB 300 MG/2ML ~~LOC~~ SOSY
300.0000 mg | PREFILLED_SYRINGE | Freq: Once | SUBCUTANEOUS | Status: AC
Start: 2024-02-12 — End: 2024-02-12
  Administered 2024-02-12: 300 mg via SUBCUTANEOUS

## 2024-02-16 ENCOUNTER — Encounter: Payer: Self-pay | Admitting: Allergy

## 2024-02-16 LAB — TRYPTASE: Tryptase: 6.6 ug/L (ref 2.2–13.2)

## 2024-02-16 LAB — ALPHA-GAL PANEL
Allergen Lamb IgE: <0.1 kU/L
Beef IgE: 0.11 kU/L — AB
IgE (Immunoglobulin E), Serum: 476 [IU]/mL (ref 6–495)
O215-IgE Alpha-Gal: <0.1 kU/L
Pork IgE: <0.1 kU/L

## 2024-02-17 ENCOUNTER — Other Ambulatory Visit: Payer: Self-pay

## 2024-02-17 ENCOUNTER — Other Ambulatory Visit: Payer: Self-pay | Admitting: Pharmacy Technician

## 2024-02-17 NOTE — Progress Notes (Signed)
 Specialty Pharmacy Refill Coordination Note  Frederick Payne is a 63 y.o. male contacted today regarding refills of specialty medication(s) No data recorded  Patient requested (Patient-Rptd) Delivery   Delivery date: (Patient-Rptd) 03/01/24 **New Delivery Date 03/02/24 Spoke with patient & aware**   Verified address: (Patient-Rptd) [address] [name], [place] [address]. - gate code: [phone]   Medication will be filled on 03/01/24.   Spoke with patient & was train to inject at home so that's why shipping to home this fill

## 2024-03-01 ENCOUNTER — Other Ambulatory Visit: Payer: Self-pay

## 2024-03-15 ENCOUNTER — Other Ambulatory Visit: Payer: Self-pay

## 2024-03-17 ENCOUNTER — Other Ambulatory Visit: Payer: Self-pay

## 2024-03-17 NOTE — Progress Notes (Signed)
 Specialty Pharmacy Refill Coordination Note  Frederick Payne is a 63 y.o. male contacted today regarding refills of specialty medication(s) Dupilumab  (Dupixent )   Patient requested (Patient-Rptd) Delivery   Delivery date: (Patient-Rptd) 03/25/24   Verified address: (Patient-Rptd) [address] [place], [place]   Medication will be filled on 03/24/24.

## 2024-03-24 ENCOUNTER — Other Ambulatory Visit: Payer: Self-pay

## 2024-04-08 ENCOUNTER — Other Ambulatory Visit: Payer: Self-pay

## 2024-04-16 ENCOUNTER — Other Ambulatory Visit: Payer: Self-pay

## 2024-04-19 ENCOUNTER — Other Ambulatory Visit: Payer: Self-pay

## 2024-04-19 NOTE — Progress Notes (Signed)
 Specialty Pharmacy Refill Coordination Note  Frederick Payne is a 63 y.o. male contacted today regarding refills of specialty medication(s) Dupilumab  (Dupixent )   Patient requested Delivery   Delivery date: 04/21/24   Verified address: [address]   [place] [address]   Medication will be filled on 04/20/24.

## 2024-05-17 ENCOUNTER — Other Ambulatory Visit (HOSPITAL_COMMUNITY): Payer: Self-pay

## 2024-05-17 NOTE — Progress Notes (Signed)
 Follow Up Note  RE: Frederick Payne MRN: [id number] DOB: 1961/10/16 Date of Office Visit: 05/18/2024  Referring provider: Corrington, Frederick A, MD Primary care provider: Bobbette Payne LABOR, MD  Chief Complaint: Follow-up (Patient presents to the office for a follow up. Patient reports no issues or concerns.)  History of Present Illness: I had the pleasure of seeing Frederick Payne for a follow up visit at the [facility] on 05/18/2024. He is a 63 y.o. male, who is being followed for EOE on Dupixent , adverse food reaction, oral allergy  syndrome, allergic rhinoconjunctivitis and asthma. His previous allergy  office visit was on 01/27/2024 with Dr. Luke. Today is a regular follow up visit.  Discussed the use of AI scribe software for clinical note transcription with the patient, who gave verbal consent to proceed.    He has experienced increased shortness of breath, particularly at the end of the day, which he attributes to spending time outside. This has led to more frequent use of his rescue inhaler. He experiences significant coughing but has not had any recent ER visits or required prednisone . He has not used his inhaler today. He is currently on montelukast  for allergies, which he feels is ineffective. He has Flonase  and azelastine nasal sprays but uses them sparingly. He has sneezing, runny nose, and watery eyes, possibly due to a floral arrangement in his house currently.  He continues to experience gastrointestinal symptoms, including vomiting undigested food in the mornings. 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 He is on Dupixent  injections weekly, which he feels have calmed his stomach symptoms, although he still experiences gas. He has not had an endoscopy since starting Dupixent .  He is avoiding certain foods due to eosinophilic esophagitis and reports difficulty finding suitable foods. He has reintroduced soy,  sesame, and potatoes into his diet without issues but avoids peanuts, tree nuts, beans, dairy, eggs, coconut, and corn due to adverse reactions.  He is scheduled for hemorrhoid surgery and will be on Lovenox shots for two weeks. He is also on Repatha injections biweekly for cholesterol management.     2025 labs: Alpha gal panel negative. Tryptase level normal.  Assessment and Plan: Frederick Payne is a 63 y.o. male with: Not well controlled moderate persistent asthma Past history - Used to see pulmonology in the past. And only using albuterol  as needed. Sometimes has seasonal exacerbations. 2025 spirometry was unremarkable.  Interim history - Noted increased shortness of breath and night and using albuterol  daily with good benefit. Today's spirometry was unremarkable given today's efforts with 15% and 460cc improvement in FEV1 post bronchodilator treatment. Clinically feeling improved.  Daily controller medication(s): start Breo 100mcg 1 puff once a day and rinse mouth after each use.  May use albuterol  rescue inhaler 2 puffs every 4 to 6 hours as needed for shortness of breath, chest tightness, coughing, and wheezing.  Monitor frequency of use - if you need to use it more than twice per week on a consistent basis let us  know.   Eosinophilic esophagitis Gastrointestinal complaints Other adverse food reactions, not elsewhere classified, subsequent encounter Oral allergy  syndrome Past history - EOE confirmed by endoscopy with 35 eosinophils/HPF. Symptoms improved with diet and Pulmicort  slurry BID. Used to be on PPI as well. 2025 labs showed multiple positives and avoiding foods which helping but still having some GI symptoms. He experiences anaphylactic reactions to coconut, requiring hospitalization, and reports syncope after  consuming corn. Milk causes vomiting and tremors. Feels better with avoiding milk, egg and wheat. 2025 skin testing positive to peanut, soy, almond, hazelnut, oat, white  potatoes, sweet potato, navy bean, green beans, carrots and cherry. Borderline to sesame. 2025 labs negative to alpha gal and normal tryptase. Interim history - tolerating sesame, and soy. EoE symptoms better since started Dupixent  in April 2025 but still having vomiting in the morning. Per GI he needs botox injection in the pyloric valve. Continue to avoid peanuts, tree nuts, coconuts, beans, cherries.  Continue to avoid milk, eggs, corn. Okay to try oats, wheat, cooked carrots at home.  For mild symptoms you can take over the counter antihistamines (zyrtec 10mg  to 20mg ) and monitor symptoms closely.  If symptoms worsen or if you have severe symptoms including breathing issues, throat closure, significant swelling, whole body hives, severe diarrhea and vomiting, lightheadedness then use epinephrine  and seek immediate medical care afterwards. Emergency action plan in place.  Continue to see GI as scheduled - do they want to repeat EGD since you started Dupixent ? Continue Dupixent  injections weekly at home.    Other allergic rhinitis Allergic conjunctivitis of both eyes Past history - on SLIT for 6+ months and stopped. Flu-like symptoms managed with Astelin and Singulair .  Interim history - Symptoms include sneezing, runny nose, and watery eyes. Montelukast  ineffective. No nasal spray use reported. Once your GI issues are stable will consider environmental allergy  testing and starting allergy  shots. Use over the counter antihistamines such as Zyrtec (cetirizine), Claritin (loratadine), Allegra (fexofenadine), or Xyzal (levocetirizine) daily as needed. May take twice a day during allergy  flares. May switch antihistamines every few months. Continue Singulair  (montelukast ) 10mg  daily at night. Use Flonase  (fluticasone ) nasal spray 1-2 sprays per nostril once a day as needed for nasal congestion.  Nasal saline spray (i.e., Simply Saline) or nasal saline lavage (i.e., NeilMed) is recommended as needed  and prior to medicated nasal sprays. Use azelastine nasal spray 1-2 sprays per nostril twice a day as needed for runny nose/drainage.  Return in about 3 months (around 08/18/2024).  Meds ordered this encounter  Medications   fluticasone  furoate-vilanterol (BREO ELLIPTA ) 100-25 MCG/ACT AEPB    Sig: Inhale 1 puff into the lungs daily. Rinse mouth after each use.    Dispense:  60 each    Refill:  5   Lab Orders  No laboratory test(s) ordered today    Diagnostics: Spirometry:  Tracings reviewed. His effort: It was hard to get consistent efforts and there is a question as to whether this reflects a maximal maneuver. FVC: 4.15L FEV1: 2.91L, 92% predicted FEV1/FVC ratio: 70% Interpretation: No overt abnormalities noted given today's efforts with 15% and 460cc improvement in FEV1 post bronchodilator treatment. Clinically feeling improved.   Please see scanned spirometry results for details.  Results discussed with patient/family.   Medication List:  Current Outpatient Medications  Medication Sig Dispense Refill   albuterol  (VENTOLIN  HFA) 108 (90 Base) MCG/ACT inhaler INHALE 2 PUFFS BY MOUTH EVERY 4 TO 6 HOURS AS NEEDED 18 g 0   ascorbic acid (VITAMIN C) 500 MG tablet Take 500 mg by mouth daily.     aspirin 81 MG tablet Take 81 mg by mouth daily.     bethanechol (URECHOLINE) 10 MG tablet      COAGUCHEK LANCETS MISC 1 each by Does not apply route as directed. 200 each 1   DHA-PHOSPHATIDYLSERINE PO Take 500 mg by mouth.     dicyclomine (BENTYL) 10 MG capsule  dupilumab  (DUPIXENT ) 300 MG/2ML prefilled syringe Inject 300 mg into the skin every 7 (seven) days. 8 mL 11   EPINEPHrine  0.3 mg/0.3 mL IJ SOAJ injection Inject 0.3 mg into the muscle as needed for anaphylaxis. 2 each 1   fish oil-omega-3 fatty acids 1000 MG capsule Take 1 g by mouth daily.     fluticasone  (FLONASE ) 50 MCG/ACT nasal spray one spray by Both Nostrils route as needed.     fluticasone  furoate-vilanterol (BREO  ELLIPTA) 100-25 MCG/ACT AEPB Inhale 1 puff into the lungs daily. Rinse mouth after each use. 60 each 5   iron  polysaccharides (NIFEREX) 150 MG capsule TAKE 1 CAPSULE BY MOUTH EVERY DAY 30 capsule 0   L-Methylfolate-Algae (DEPLIN 15) 15-90.314 MG CAPS Take by mouth.     LINZESS 72 MCG capsule      montelukast  (SINGULAIR ) 10 MG tablet Take 1 tablet (10 mg total) by mouth at bedtime. 30 tablet 5   Multiple Vitamin (MULTIVITAMIN) tablet Take 1 tablet by mouth every morning.     Multiple Vitamins-Minerals (ONE-A-DAY MENS 50+ ADVANTAGE PO) Take by mouth daily.     ondansetron  (ZOFRAN -ODT) 8 MG disintegrating tablet Take by mouth.     OVER THE COUNTER MEDICATION      phenazopyridine (PYRIDIUM) 95 MG tablet Take by mouth.     Prucalopride Succinate 2 MG TABS      PT/INR Test (COAGUCHEK PT TEST) STRP 1 each by In Vitro route as directed. 48 each 1   PT/INR Testing Monitor (COAGUCHEK XS PLUS SYSTEM) KIT 1 each by Does not apply route as directed. 1 kit 0   ranolazine (RANEXA) 500 MG 12 hr tablet Take 500 mg by mouth 2 (two) times daily.     REPATHA SURECLICK 140 MG/ML SOAJ Inject into the skin.     S-Adenosylmethionine (SAM-E) 400 MG TABS Take 1 tablet by mouth daily.     St Johns Wort 300 MG CAPS Take 300 mg by mouth 2 (two) times daily.     sucralfate (CARAFATE) 1 g tablet      tadalafil  (CIALIS ) 5 MG tablet Take 5 mg by mouth daily.     Thiamine HCl (B-1) 100 MG TABS Take by mouth.     Tyrosine 500 MG TABS Take 1 tablet by mouth every evening.     VOQUEZNA 20 MG TABS Take by mouth.     warfarin (COUMADIN ) 10 MG tablet TAKE 1 TABLET(10 MG) BY MOUTH DAILY 90 tablet 0   warfarin (COUMADIN ) 2 MG tablet TAKE 1 TABLET BY MOUTH DAILY 30 tablet 2   warfarin (COUMADIN ) 5 MG tablet TAKE 1 TABLET BY MOUTH DAILY 90 tablet 1   No current facility-administered medications for this visit.   Allergies: Allergies  Allergen Reactions   Benzodiazepines Other (See Comments)    Causes pt to get violent     Contrast Media [Iodinated Contrast Media] Hives   Iodine Anaphylaxis   Other Anaphylaxis    FRUITS FROM TREES.    Pectin Anaphylaxis   Shellfish Allergy  Anaphylaxis   Sulfa Antibiotics Anaphylaxis   Diazepam  Other (See Comments)    Headache, strange behavior, memory loss   Lactose Intolerance (Gi) Nausea And Vomiting   I reviewed his past medical history, social history, family history, and environmental history and no significant changes have been reported from his previous visit.  Review of Systems  Constitutional:  Negative for appetite change, chills, fever and unexpected weight change.  HENT:  Negative for congestion and rhinorrhea.  Eyes:  Negative for itching.  Respiratory:  Positive for cough and shortness of breath. Negative for chest tightness and wheezing.   Cardiovascular:  Negative for chest pain.  Gastrointestinal:  Positive for abdominal pain, nausea and vomiting.  Genitourinary:  Negative for difficulty urinating.  Skin:  Negative for rash.  Allergic/Immunologic: Positive for environmental allergies and food allergies.    Objective: BP 112/70 (BP Location: Left Arm, Patient Position: Sitting, Cuff Size: Normal)   Pulse 73   Temp 98.3 F (36.8 C) (Temporal)   Resp 16   Ht 5' 6.81 (1.697 m)   Wt 160 lb (72.6 kg)   SpO2 96%   BMI 25.20 kg/m  Body mass index is 25.2 kg/m. Physical Exam Vitals and nursing note reviewed.  Constitutional:      Appearance: Normal appearance. He is well-developed.  HENT:     Head: Normocephalic and atraumatic.     Right Ear: Tympanic membrane and external ear normal.     Left Ear: Tympanic membrane and external ear normal.     Nose: Nose normal.     Mouth/Throat:     Mouth: Mucous membranes are moist.     Pharynx: Oropharynx is clear.  Eyes:     Conjunctiva/sclera: Conjunctivae normal.  Cardiovascular:     Rate and Rhythm: Normal rate and regular rhythm.     Heart sounds: Normal heart sounds. No murmur heard.    No  friction rub. No gallop.  Pulmonary:     Effort: Pulmonary effort is normal.     Breath sounds: Normal breath sounds. No wheezing, rhonchi or rales.  Musculoskeletal:     Cervical back: Neck supple.  Skin:    General: Skin is warm.     Findings: No rash.  Neurological:     Mental Status: He is alert and oriented to person, place, and time.  Psychiatric:        Behavior: Behavior normal.    Previous notes and tests were reviewed. The plan was reviewed with the patient/family, and all questions/concerned were addressed.  It was my pleasure to see Mauri today and participate in his care. Please feel free to contact me with any questions or concerns.  Sincerely,  Orlan Cramp, DO Allergy  & Immunology  [facility]  [facility] office: [phone] [facility] office: [phone]  Total Time: 40 minutes Time spent on day of service preparing to see patient; obtaining and reviewing separately obtained history;  performing examination; counseling and educating patient and family; ordering medications, tests and procedures; documenting clinical information in the health record; independently interpreting results and communicating to patient/family.

## 2024-05-18 ENCOUNTER — Ambulatory Visit (INDEPENDENT_AMBULATORY_CARE_PROVIDER_SITE_OTHER): Admitting: Allergy

## 2024-05-18 ENCOUNTER — Encounter: Payer: Self-pay | Admitting: Allergy

## 2024-05-18 ENCOUNTER — Other Ambulatory Visit: Payer: Self-pay

## 2024-05-18 ENCOUNTER — Encounter (INDEPENDENT_AMBULATORY_CARE_PROVIDER_SITE_OTHER): Payer: Self-pay

## 2024-05-18 VITALS — BP 112/70 | HR 73 | Temp 98.3°F | Resp 16 | Ht 66.81 in | Wt 160.0 lb

## 2024-05-18 DIAGNOSIS — H1013 Acute atopic conjunctivitis, bilateral: Secondary | ICD-10-CM

## 2024-05-18 DIAGNOSIS — K200 Eosinophilic esophagitis: Secondary | ICD-10-CM

## 2024-05-18 DIAGNOSIS — J4540 Moderate persistent asthma, uncomplicated: Secondary | ICD-10-CM | POA: Diagnosis not present

## 2024-05-18 DIAGNOSIS — J3089 Other allergic rhinitis: Secondary | ICD-10-CM

## 2024-05-18 DIAGNOSIS — T781XXD Other adverse food reactions, not elsewhere classified, subsequent encounter: Secondary | ICD-10-CM

## 2024-05-18 DIAGNOSIS — J4520 Mild intermittent asthma, uncomplicated: Secondary | ICD-10-CM

## 2024-05-18 DIAGNOSIS — R198 Other specified symptoms and signs involving the digestive system and abdomen: Secondary | ICD-10-CM | POA: Diagnosis not present

## 2024-05-18 MED ORDER — FLUTICASONE FUROATE-VILANTEROL 100-25 MCG/ACT IN AEPB: 1.0000 | INHALATION_SPRAY | Freq: Every day | RESPIRATORY_TRACT | 5 refills | Status: DC

## 2024-05-18 NOTE — Patient Instructions (Addendum)
 Food allergies Continue to avoid peanuts, tree nuts, coconuts, beans, cherries.  Continue to avoid milk, eggs, corn. Okay to try oats, wheat, cooked carrots at home.  For mild symptoms you can take over the counter antihistamines (zyrtec 10mg  to 20mg ) and monitor symptoms closely.  If symptoms worsen or if you have severe symptoms including breathing issues, throat closure, significant swelling, whole body hives, severe diarrhea and vomiting, lightheadedness then use epinephrine  and seek immediate medical care afterwards. Emergency action plan in place.   Eosinophilic esophagitis Continue to see GI as scheduled - do they want to repeat EGD since you started Dupixent ? Continue Dupixent  injections weekly at home.   Environmental allergies Once your GI issues are stable will consider environmental allergy  testing and starting allergy  shots. Use over the counter antihistamines such as Zyrtec (cetirizine), Claritin (loratadine), Allegra (fexofenadine), or Xyzal (levocetirizine) daily as needed. May take twice a day during allergy  flares. May switch antihistamines every few months. Continue Singulair  (montelukast ) 10mg  daily at night. Use Flonase  (fluticasone ) nasal spray 1-2 sprays per nostril once a day as needed for nasal congestion.  Nasal saline spray (i.e., Simply Saline) or nasal saline lavage (i.e., NeilMed) is recommended as needed and prior to medicated nasal sprays. Use azelastine nasal spray 1-2 sprays per nostril twice a day as needed for runny nose/drainage.  Asthma Daily controller medication(s): start Breo 100mcg 1 puff once a day and rinse mouth after each use.  May use albuterol  rescue inhaler 2 puffs every 4 to 6 hours as needed for shortness of breath, chest tightness, coughing, and wheezing.  Monitor frequency of use - if you need to use it more than twice per week on a consistent basis let us  know.  Breathing control goals:  Full participation in all desired activities (may  need albuterol  before activity) Albuterol  use two times or less a week on average (not counting use with activity) Cough interfering with sleep two times or less a month Oral steroids no more than once a year No hospitalizations   Return in about 3 months (around 08/18/2024). Or sooner if needed.

## 2024-05-18 NOTE — Progress Notes (Signed)
 Specialty Pharmacy Refill Coordination Note  [name] is a 63 y.o. male contacted today regarding refills of specialty medication(s) Dupilumab  (Dupixent )   Patient requested (Patient-Rptd) Delivery   Delivery date: 05/20/24   Verified address: [address] [place], [place] [address]. Gate code: [phone]   Medication will be filled on 07.24.25.

## 2024-06-09 ENCOUNTER — Other Ambulatory Visit: Payer: Self-pay

## 2024-06-11 ENCOUNTER — Other Ambulatory Visit: Payer: Self-pay

## 2024-06-25 ENCOUNTER — Other Ambulatory Visit: Payer: Self-pay

## 2024-06-29 ENCOUNTER — Other Ambulatory Visit: Payer: Self-pay

## 2024-06-29 NOTE — Progress Notes (Signed)
 Specialty Pharmacy Refill Coordination Note  Frederick Payne is a 63 y.o. male contacted today regarding refills of specialty medication(s) Dupilumab  (Dupixent )   Patient requested Delivery   Delivery date: 07/06/24   Verified address: [address] [place], [place] [address]. Gate code: [phone]   Medication will be filled on 09.08.25.   Next injection: 09.11.25

## 2024-07-05 ENCOUNTER — Other Ambulatory Visit: Payer: Self-pay

## 2024-07-26 ENCOUNTER — Encounter (INDEPENDENT_AMBULATORY_CARE_PROVIDER_SITE_OTHER): Payer: Self-pay

## 2024-07-27 ENCOUNTER — Other Ambulatory Visit: Payer: Self-pay | Admitting: Pharmacy Technician

## 2024-07-27 ENCOUNTER — Other Ambulatory Visit: Payer: Self-pay

## 2024-07-27 NOTE — Progress Notes (Signed)
 Specialty Pharmacy Refill Coordination Note  Frederick Payne is a 63 y.o. male contacted today regarding refills of specialty medication(s) Dupilumab  (Dupixent )   Patient requested (Patient-Rptd) Delivery   Delivery date: 08/03/24 Verified address: (Patient-Rptd) [address] [place], [place] [address].   Gate code: [phone]   Medication will be filled on 08/02/24.

## 2024-08-02 ENCOUNTER — Other Ambulatory Visit: Payer: Self-pay

## 2024-08-11 ENCOUNTER — Other Ambulatory Visit: Payer: Self-pay | Admitting: Allergy

## 2024-08-19 ENCOUNTER — Telehealth: Payer: Self-pay | Admitting: Allergy

## 2024-08-19 ENCOUNTER — Ambulatory Visit (INDEPENDENT_AMBULATORY_CARE_PROVIDER_SITE_OTHER): Admitting: Allergy

## 2024-08-19 ENCOUNTER — Other Ambulatory Visit: Payer: Self-pay

## 2024-08-19 VITALS — BP 118/72 | HR 74 | Temp 98.3°F | Resp 18 | Ht 66.0 in | Wt 154.1 lb

## 2024-08-19 DIAGNOSIS — J3089 Other allergic rhinitis: Secondary | ICD-10-CM

## 2024-08-19 DIAGNOSIS — J4540 Moderate persistent asthma, uncomplicated: Secondary | ICD-10-CM | POA: Diagnosis not present

## 2024-08-19 DIAGNOSIS — K200 Eosinophilic esophagitis: Secondary | ICD-10-CM

## 2024-08-19 DIAGNOSIS — T7819XD Other adverse food reactions, not elsewhere classified, subsequent encounter: Secondary | ICD-10-CM | POA: Diagnosis not present

## 2024-08-19 DIAGNOSIS — H1013 Acute atopic conjunctivitis, bilateral: Secondary | ICD-10-CM

## 2024-08-19 DIAGNOSIS — R42 Dizziness and giddiness: Secondary | ICD-10-CM

## 2024-08-19 MED ORDER — EPINEPHRINE 0.3 MG/0.3ML IJ SOAJ: 0.3000 mg | INTRAMUSCULAR | 1 refills | Status: AC | PRN

## 2024-08-19 NOTE — Progress Notes (Unsigned)
 Follow Up Note  RE: Frederick Payne MRN: [id number] DOB: 1960-12-27 Date of Office Visit: 08/19/2024  Referring provider: [name] LABOR, MD Primary care provider: [name] LABOR, MD  Chief Complaint: Follow-up (Pt reports having anaphylaxis with pineapple sores in mouth and swollen tongue used epipen . Kiwi was swollen tongue and lip, states only having to take Claritin for that)  History of Present Illness: I had the pleasure of seeing Frederick Payne for a follow up visit at the [facility] on 08/19/2024. He is a 63 y.o. male, who is being followed for asthma, EOE, allergic rhinoconjunctivitis. His previous allergy  office visit was on 05/18/2024 with Dr. Luke. Today is a regular follow up visit.  Discussed the use of AI scribe software for clinical note transcription with the patient, who gave verbal consent to proceed.    Since the last visit in July, he has identified new allergies to pineapple and kiwi, causing tongue swelling and oral sores. An epinephrine  injection resolved the pineapple reaction in about 30 minutes. He also experienced a milder perioral reaction to kiwi that resolved with antihistamines.   His breathing has improved with Breo, one puff daily, but he used his rescue inhaler twice this week due to fall allergies, typically at night. No emergency care has been required for his breathing issues.  He has eosinophilic esophagitis and recently underwent esophageal scope and dilation. He continues to take weekly Dupixent  injections and reports that vomiting, which was previously daily, has decreased to two episodes in the past month. He tolerates wheat and oats but experiences increased gas.  He experiences environmental allergies, particularly in the fall, and takes montelukast  nightly. He has not used Flonase  or azelastine nasal spray and prefers to avoid additional allergy  medications like Zyrtec or Claritin. Previous was on SLIT therapy from outside  facility for this which I stressed was not recommended in EoE patients.   He reports sinus issues, including recent severe epistaxis and unilateral sinus pain, associated with dizziness and diplopia. He has not seen an ENT in over a decade but has a history of sinus surgery. Dizziness and diplopia are managed with prism  lenses and is being followed by specialists.   He has experienced two episodes of syncope in the past two weeks, associated with cramps during bowel movements.      EGD 08/03/2024 biopsy results:  Diagnosis   A.  Esophagus, biopsies: Squamous mucosa with mild edematous changes Negative for increased inflammation (no intraepithelial eosinophils) Negative for dysplasia or malignancy    Assessment and Plan: Frederick Payne is a 63 y.o. male with: Moderate persistent asthma without complication Past history - Used to see pulmonology in the past. And only using albuterol  as needed. Sometimes has seasonal exacerbations. 2025 spirometry was unremarkable.  Interim history - doing better with Breo. Today's spirometry was unremarkable. Daily controller medication(s): Breo 100mcg 1 puff once a day and rinse mouth after each use.  May use albuterol  rescue inhaler 2 puffs every 4 to 6 hours as needed for shortness of breath, chest tightness, coughing, and wheezing.  Monitor frequency of use - if you need to use it more than twice per week on a consistent basis let us  know.   Other allergic rhinitis Allergic conjunctivitis of both eyes Past history - on SLIT for 6+ months and stopped. Flu-like symptoms managed with Astelin and Singulair .  Interim history - still having sinus symptoms Use over the counter antihistamines such as Zyrtec (cetirizine), Claritin (loratadine), Allegra (fexofenadine), or  Xyzal (levocetirizine) daily as needed. May take twice a day during allergy  flares. May switch antihistamines every few months. Continue Singulair  (montelukast ) 10mg  daily at night. Use Flonase   (fluticasone ) nasal spray 1-2 sprays per nostril once a day as needed for nasal congestion.  Nasal saline spray (i.e., Simply Saline) or nasal saline lavage (i.e., NeilMed) is recommended as needed and prior to medicated nasal sprays. Use azelastine nasal spray 1-2 sprays per nostril twice a day as needed for runny nose/drainage. Refer to ENT.  If work up unremarkable consider allergy  testing again.  Other adverse food reactions, not elsewhere classified, subsequent encounter Oral allergy  syndrome, subsequent encounter Past history -  2025 labs showed multiple positives and avoiding foods which helping but still having some GI symptoms. He experiences anaphylactic reactions to coconut, requiring hospitalization, and reports syncope after consuming corn. Milk causes vomiting and tremors. Feels better with avoiding milk, egg and wheat. 2025 skin testing positive to peanut, soy, almond, hazelnut, oat, white potatoes, sweet potato, navy bean, green beans, carrots and cherry. Borderline to sesame. 2025 labs negative to alpha gal and normal tryptase. Interim history - Patient now concerned about pineapple as he had oral irritation/swelling requiring IM epi. Milder symptoms with kiwi treated with antihistamines only. Bromelain likely cause of oral irritation.  Advised avoidance due to severity. Continue to avoid peanuts, tree nuts, coconuts, beans, cherries.  Continue to avoid milk, eggs, corn, pineapple, kiwi.  We can consider labwork in the future.  For mild symptoms you can take over the counter antihistamines (zyrtec 10mg  to 20mg ) and monitor symptoms closely.  If symptoms worsen or if you have severe symptoms including breathing issues, throat closure, significant swelling, whole body hives, severe diarrhea and vomiting, lightheadedness then use epinephrine  and seek immediate medical care afterwards. Emergency action plan in place.   Eosinophilic esophagitis Past history - EOE confirmed by endoscopy  with 35 eosinophils/HPF. Symptoms improved with diet and Pulmicort  slurry BID. Used to be on PPI as well. Interim history - doing much better with Dupixent . Repeat scope showed no eos (08/03/24). Continue to see GI as scheduled. Continue Dupixent  injections weekly at home.   Dizziness Please follow up with PCP regarding this.   Return in about 3 months (around 11/19/2024).  Meds ordered this encounter  Medications   EPINEPHrine  0.3 mg/0.3 mL IJ SOAJ injection    Sig: Inject 0.3 mg into the muscle as needed for anaphylaxis.    Dispense:  2 each    Refill:  1    May dispense generic/Mylan/Teva brand.   Lab Orders  No laboratory test(s) ordered today    Diagnostics: Spirometry:  Tracings reviewed. His effort: Good reproducible efforts. FVC: 4.17L FEV1: 2.67L, 84% predicted FEV1/FVC ratio: 64% Interpretation: Spirometry consistent with normal pattern.  Please see scanned spirometry results for details.  Results discussed with patient/family.   Medication List:  Current Outpatient Medications  Medication Sig Dispense Refill   albuterol  (VENTOLIN  HFA) 108 (90 Base) MCG/ACT inhaler INHALE 2 PUFFS BY MOUTH EVERY 4 TO 6 HOURS AS NEEDED 18 g 0   ascorbic acid (VITAMIN C) 500 MG tablet Take 500 mg by mouth daily.     aspirin 81 MG tablet Take 81 mg by mouth daily.     bethanechol (URECHOLINE) 10 MG tablet      COAGUCHEK LANCETS MISC 1 each by Does not apply route as directed. 200 each 1   DHA-PHOSPHATIDYLSERINE PO Take 500 mg by mouth.     dicyclomine (BENTYL) 10 MG capsule  dupilumab  (DUPIXENT ) 300 MG/2ML prefilled syringe Inject 300 mg into the skin every 7 (seven) days. 8 mL 11   EPINEPHrine  0.3 mg/0.3 mL IJ SOAJ injection Inject 0.3 mg into the muscle as needed for anaphylaxis. 2 each 1   fish oil-omega-3 fatty acids 1000 MG capsule Take 1 g by mouth daily.     fluticasone  (FLONASE ) 50 MCG/ACT nasal spray one spray by Both Nostrils route as needed.     fluticasone   furoate-vilanterol (BREO ELLIPTA ) 100-25 MCG/ACT AEPB Inhale 1 puff into the lungs daily. Rinse mouth after each use. 60 each 5   iron  polysaccharides (NIFEREX) 150 MG capsule TAKE 1 CAPSULE BY MOUTH EVERY DAY 30 capsule 0   L-Methylfolate-Algae (DEPLIN 15) 15-90.314 MG CAPS Take by mouth.     montelukast  (SINGULAIR ) 10 MG tablet TAKE 1 TABLET BY MOUTH EVERYDAY AT BEDTIME 90 tablet 1   Multiple Vitamin (MULTIVITAMIN) tablet Take 1 tablet by mouth every morning.     Multiple Vitamins-Minerals (ONE-A-DAY MENS 50+ ADVANTAGE PO) Take by mouth daily.     ondansetron  (ZOFRAN -ODT) 8 MG disintegrating tablet Take by mouth.     OVER THE COUNTER MEDICATION      phenazopyridine (PYRIDIUM) 95 MG tablet Take by mouth.     Plecanatide (TRULANCE PO) Take by mouth.     Prucalopride Succinate 2 MG TABS      PT/INR Test (COAGUCHEK PT TEST) STRP 1 each by In Vitro route as directed. 48 each 1   PT/INR Testing Monitor (COAGUCHEK XS PLUS SYSTEM) KIT 1 each by Does not apply route as directed. 1 kit 0   ranolazine (RANEXA) 500 MG 12 hr tablet Take 500 mg by mouth 2 (two) times daily.     REPATHA SURECLICK 140 MG/ML SOAJ Inject into the skin.     S-Adenosylmethionine (SAM-E) 400 MG TABS Take 1 tablet by mouth daily.     St Johns Wort 300 MG CAPS Take 300 mg by mouth 2 (two) times daily.     sucralfate (CARAFATE) 1 g tablet      tadalafil  (CIALIS ) 5 MG tablet Take 5 mg by mouth daily.     Thiamine HCl (B-1) 100 MG TABS Take by mouth.     Tyrosine 500 MG TABS Take 1 tablet by mouth every evening.     VOQUEZNA 20 MG TABS Take by mouth.     warfarin (COUMADIN ) 10 MG tablet TAKE 1 TABLET(10 MG) BY MOUTH DAILY 90 tablet 0   warfarin (COUMADIN ) 2 MG tablet TAKE 1 TABLET BY MOUTH DAILY 30 tablet 2   warfarin (COUMADIN ) 5 MG tablet TAKE 1 TABLET BY MOUTH DAILY 90 tablet 1   No current facility-administered medications for this visit.   Allergies: Allergies  Allergen Reactions   Benzodiazepines Other (See Comments)     Causes pt to get violent    Contrast Media [Iodinated Contrast Media] Hives   Iodine Anaphylaxis   Other Anaphylaxis    FRUITS FROM TREES.    Pectin Anaphylaxis   Shellfish Allergy  Anaphylaxis   Sulfa Antibiotics Anaphylaxis   Diazepam  Other (See Comments)    Headache, strange behavior, memory loss   Lactose Intolerance (Gi) Nausea And Vomiting   I reviewed his past medical history, social history, family history, and environmental history and no significant changes have been reported from his previous visit.  Review of Systems  Constitutional:  Negative for appetite change, chills, fever and unexpected weight change.  HENT:  Positive for congestion and rhinorrhea.   Eyes:  Negative for itching.  Respiratory:  Negative for cough, chest tightness, shortness of breath and wheezing.   Cardiovascular:  Negative for chest pain.  Gastrointestinal:  Negative for abdominal pain, nausea and vomiting.  Genitourinary:  Negative for difficulty urinating.  Skin:  Negative for rash.  Allergic/Immunologic: Positive for environmental allergies and food allergies.  Neurological:  Positive for dizziness. Negative for headaches.    Objective: BP 118/72 (BP Location: Left Arm, Patient Position: Sitting, Cuff Size: Normal)   Pulse 74   Temp 98.3 F (36.8 C) (Temporal)   Resp 18   Ht 5' 6 (1.676 m)   Wt 154 lb 1.9 oz (69.9 kg)   SpO2 98%   BMI 24.88 kg/m  Body mass index is 24.88 kg/m. Physical Exam Vitals and nursing note reviewed.  Constitutional:      Appearance: Normal appearance. He is well-developed.  HENT:     Head: Normocephalic and atraumatic.     Right Ear: Tympanic membrane and external ear normal.     Left Ear: Tympanic membrane and external ear normal.     Nose: Nose normal.     Mouth/Throat:     Mouth: Mucous membranes are moist.     Pharynx: Oropharynx is clear.  Eyes:     Conjunctiva/sclera: Conjunctivae normal.  Cardiovascular:     Rate and Rhythm: Normal rate and  regular rhythm.     Heart sounds: Normal heart sounds. No murmur heard.    No friction rub. No gallop.  Pulmonary:     Effort: Pulmonary effort is normal.     Breath sounds: Normal breath sounds. No wheezing, rhonchi or rales.  Musculoskeletal:     Cervical back: Neck supple.  Skin:    General: Skin is warm.     Findings: No rash.  Neurological:     Mental Status: He is alert and oriented to person, place, and time.  Psychiatric:        Behavior: Behavior normal.    Previous notes and tests were reviewed. The plan was reviewed with the patient/family, and all questions/concerned were addressed.  It was my pleasure to see Frederick Payne today and participate in his care. Please feel free to contact me with any questions or concerns.  Sincerely,  Orlan Cramp, DO Allergy  & Immunology  [facility]  [place] office: [phone] [facility] office: [phone]

## 2024-08-19 NOTE — Patient Instructions (Addendum)
 Food allergies Continue to avoid peanuts, tree nuts, coconuts, beans, cherries.  Continue to avoid milk, eggs, corn, pineapple, kiwi.  We can consider labwork in the future.  For mild symptoms you can take over the counter antihistamines (zyrtec 10mg  to 20mg ) and monitor symptoms closely.  If symptoms worsen or if you have severe symptoms including breathing issues, throat closure, significant swelling, whole body hives, severe diarrhea and vomiting, lightheadedness then use epinephrine  and seek immediate medical care afterwards. Emergency action plan in place.   Eosinophilic esophagitis Continue to see GI as scheduled. Continue Dupixent  injections weekly at home.   Environmental allergies Use over the counter antihistamines such as Zyrtec (cetirizine), Claritin (loratadine), Allegra (fexofenadine), or Xyzal (levocetirizine) daily as needed. May take twice a day during allergy  flares. May switch antihistamines every few months. Continue Singulair  (montelukast ) 10mg  daily at night. Use Flonase  (fluticasone ) nasal spray 1-2 sprays per nostril once a day as needed for nasal congestion.  Nasal saline spray (i.e., Simply Saline) or nasal saline lavage (i.e., NeilMed) is recommended as needed and prior to medicated nasal sprays. Use azelastine nasal spray 1-2 sprays per nostril twice a day as needed for runny nose/drainage. Refer to ENT.  If work up unremarkable consider allergy  testing again.  Asthma Daily controller medication(s): Breo 100mcg 1 puff once a day and rinse mouth after each use.  May use albuterol  rescue inhaler 2 puffs every 4 to 6 hours as needed for shortness of breath, chest tightness, coughing, and wheezing.  Monitor frequency of use - if you need to use it more than twice per week on a consistent basis let us  know.  Breathing control goals:  Full participation in all desired activities (may need albuterol  before activity) Albuterol  use two times or less a week on average  (not counting use with activity) Cough interfering with sleep two times or less a month Oral steroids no more than once a year No hospitalizations   Dizziness Please follow up with PCP regarding this.  Return in about 3 months (around 11/19/2024). Or sooner if needed.

## 2024-08-20 ENCOUNTER — Encounter: Payer: Self-pay | Admitting: Allergy

## 2024-08-23 ENCOUNTER — Other Ambulatory Visit: Payer: Self-pay

## 2024-08-23 ENCOUNTER — Encounter (INDEPENDENT_AMBULATORY_CARE_PROVIDER_SITE_OTHER): Payer: Self-pay

## 2024-08-23 NOTE — Progress Notes (Signed)
 Specialty Pharmacy Refill Coordination Note  Frederick Payne is a 63 y.o. male contacted today regarding refills of specialty medication(s) Dupilumab  (Dupixent )   Patient requested Delivery   Delivery date: 08/26/24   Verified address: [address] [name], [place] [address].  [name] code: [phone]   Medication will be filled on: 08/25/24

## 2024-08-24 ENCOUNTER — Other Ambulatory Visit: Payer: Self-pay

## 2024-08-24 NOTE — Progress Notes (Signed)
 Clinical Intervention Note  Clinical Intervention Notes: Patient reported changing from Linzess to Trulance. No DDIs identified with Dupixent .   Clinical Intervention Outcomes: Prevention of an adverse drug event   Advertising Account Planner

## 2024-08-24 NOTE — Addendum Note (Signed)
 Addended by: DANIEL NIVIA DEL on: 08/24/2024 06:01 PM   Modules accepted: Orders

## 2024-09-13 ENCOUNTER — Telehealth: Payer: Self-pay | Admitting: Allergy

## 2024-09-13 ENCOUNTER — Encounter: Payer: Self-pay | Admitting: Allergy

## 2024-09-13 NOTE — Telephone Encounter (Signed)
 Please place referral to ENT for epistaxis and dizziness. Thank you.

## 2024-09-16 ENCOUNTER — Other Ambulatory Visit: Payer: Self-pay

## 2024-09-16 ENCOUNTER — Other Ambulatory Visit (HOSPITAL_COMMUNITY): Payer: Self-pay

## 2024-09-16 NOTE — Progress Notes (Signed)
 Specialty Pharmacy Refill Coordination Note  Frederick Payne is a 63 y.o. male contacted today regarding refills of specialty medication(s) Dupilumab  (Dupixent )   Patient requested (Patient-Rptd) Delivery   Delivery date: 09/30/24   Verified address: (Patient-Rptd) [address] [place], [place] [address].      Gate code: [phone]   Medication will be filled on: 09/29/24

## 2024-09-24 ENCOUNTER — Encounter (INDEPENDENT_AMBULATORY_CARE_PROVIDER_SITE_OTHER): Payer: Self-pay

## 2024-09-29 ENCOUNTER — Other Ambulatory Visit: Payer: Self-pay

## 2024-10-19 ENCOUNTER — Other Ambulatory Visit: Payer: Self-pay

## 2024-10-20 ENCOUNTER — Other Ambulatory Visit: Payer: Self-pay

## 2024-10-22 ENCOUNTER — Other Ambulatory Visit (HOSPITAL_COMMUNITY): Payer: Self-pay

## 2024-10-26 ENCOUNTER — Other Ambulatory Visit (HOSPITAL_COMMUNITY): Payer: Self-pay

## 2024-10-26 ENCOUNTER — Other Ambulatory Visit: Payer: Self-pay

## 2024-10-26 NOTE — Progress Notes (Signed)
 Specialty Pharmacy Refill Coordination Note  Spoke with Frederick Payne is a 62 y.o. male contacted today regarding refills of specialty medication(s) Dupilumab  (Dupixent )  Doses on hand: 1 for 1/1  Next inj: 11/04/24   Patient requested: Delivery   Delivery date: 11/02/24   Verified address: [id number]  [place] [address]  Medication will be filled on 11/01/24

## 2024-11-01 ENCOUNTER — Other Ambulatory Visit: Payer: Self-pay

## 2024-11-01 NOTE — Progress Notes (Signed)
 Specialty Pharmacy Ongoing Clinical Assessment Note  Frederick Payne is a 64 y.o. male who is being followed by the specialty pharmacy service for RxSp Allergy    Patient's specialty medication(s) reviewed today: Dupilumab  (Dupixent )   Missed doses in the last 4 weeks: 0   Patient/Caregiver did not have any additional questions or concerns.   Therapeutic benefit summary: Patient is achieving benefit   Adverse events/side effects summary: No adverse events/side effects   Patient's therapy is appropriate to: Continue    Goals Addressed             This Visit's Progress    Minimize recurrence of flares       Patient is on track. Patient will maintain adherence. Patient reports he has successfully reintroduced several foods without anaphylactic reaction.          Follow up: 12 months  Frederick Payne Specialty Pharmacist

## 2024-11-08 ENCOUNTER — Other Ambulatory Visit: Payer: Self-pay | Admitting: Allergy

## 2024-11-18 ENCOUNTER — Other Ambulatory Visit: Payer: Self-pay

## 2024-11-18 ENCOUNTER — Ambulatory Visit (INDEPENDENT_AMBULATORY_CARE_PROVIDER_SITE_OTHER): Admitting: Allergy

## 2024-11-18 VITALS — BP 126/70 | HR 71 | Temp 98.1°F | Resp 18 | Wt 154.8 lb

## 2024-11-18 DIAGNOSIS — T7819XD Other adverse food reactions, not elsewhere classified, subsequent encounter: Secondary | ICD-10-CM | POA: Diagnosis not present

## 2024-11-18 DIAGNOSIS — J4540 Moderate persistent asthma, uncomplicated: Secondary | ICD-10-CM | POA: Diagnosis not present

## 2024-11-18 DIAGNOSIS — R040 Epistaxis: Secondary | ICD-10-CM | POA: Diagnosis not present

## 2024-11-18 DIAGNOSIS — D4709 Other mast cell neoplasms of uncertain behavior: Secondary | ICD-10-CM

## 2024-11-18 DIAGNOSIS — J3089 Other allergic rhinitis: Secondary | ICD-10-CM | POA: Diagnosis not present

## 2024-11-18 DIAGNOSIS — H1013 Acute atopic conjunctivitis, bilateral: Secondary | ICD-10-CM | POA: Diagnosis not present

## 2024-11-18 DIAGNOSIS — R198 Other specified symptoms and signs involving the digestive system and abdomen: Secondary | ICD-10-CM

## 2024-11-18 DIAGNOSIS — K200 Eosinophilic esophagitis: Secondary | ICD-10-CM

## 2024-11-18 MED ORDER — CROMOLYN SODIUM 100 MG/5ML PO CONC: 100.0000 mg | Freq: Three times a day (TID) | ORAL | 3 refills | Status: AC

## 2024-11-18 MED ORDER — FAMOTIDINE 20 MG PO TABS: 20.0000 mg | ORAL_TABLET | Freq: Two times a day (BID) | ORAL | 2 refills | Status: AC

## 2024-11-18 NOTE — Progress Notes (Unsigned)
 "  Follow Up Note  RE: Frederick Payne MRN: [id number] DOB: 1961/06/28 Date of Office Visit: 11/18/2024  Referring provider: Corrington, Kip A, MD Primary care provider: [name] LABOR, MD  Chief Complaint: Follow-up (3 month doing the same runny and bloody nose. Wants to discuss  MCAS. Breo only received one month vs 3 months)  History of Present Illness: I had the pleasure of seeing Frederick Payne for a follow up visit at the [facility] on 11/18/2024. He is a 64 y.o. male, who is being followed for asthma, allergic rhinoconjunctivitis, adverse food reaction, EOE on Dupixent . His previous allergy  office visit was on 08/19/2024 with Dr. Luke. Today is a regular follow up visit.  Discussed the use of AI scribe software for clinical note transcription with the patient, who gave verbal consent to proceed.  History of Present Illness            Concerned about mcas.    Assessment and Plan: Ka is a 64 y.o. male with: Moderate persistent asthma without complication Past history - Used to see pulmonology in the past. And only using albuterol  as needed. Sometimes has seasonal exacerbations. 2025 spirometry was unremarkable.  Interim history - doing better with Breo. Today's spirometry was unremarkable. Daily controller medication(s): Breo 100mcg 1 puff once a day and rinse mouth after each use.  May use albuterol  rescue inhaler 2 puffs every 4 to 6 hours as needed for shortness of breath, chest tightness, coughing, and wheezing.  Monitor frequency of use - if you need to use it more than twice per week on a consistent basis let us  know.    Other allergic rhinitis Allergic conjunctivitis of both eyes Past history - on SLIT for 6+ months and stopped. Flu-like symptoms managed with Astelin and Singulair .  Interim history - still having sinus symptoms Use over the counter antihistamines such as Zyrtec (cetirizine), Claritin (loratadine), Allegra (fexofenadine), or  Xyzal (levocetirizine) daily as needed. May take twice a day during allergy  flares. May switch antihistamines every few months. Continue Singulair  (montelukast ) 10mg  daily at night. Use Flonase  (fluticasone ) nasal spray 1-2 sprays per nostril once a day as needed for nasal congestion.  Nasal saline spray (i.e., Simply Saline) or nasal saline lavage (i.e., NeilMed) is recommended as needed and prior to medicated nasal sprays. Use azelastine nasal spray 1-2 sprays per nostril twice a day as needed for runny nose/drainage. Refer to ENT.  If work up unremarkable consider allergy  testing again.   Other adverse food reactions, not elsewhere classified, subsequent encounter Oral allergy  syndrome, subsequent encounter Past history -  2025 labs showed multiple positives and avoiding foods which helping but still having some GI symptoms. He experiences anaphylactic reactions to coconut, requiring hospitalization, and reports syncope after consuming corn. Milk causes vomiting and tremors. Feels better with avoiding milk, egg and wheat. 2025 skin testing positive to peanut, soy, almond, hazelnut, oat, white potatoes, sweet potato, navy bean, green beans, carrots and cherry. Borderline to sesame. 2025 labs negative to alpha gal and normal tryptase. Interim history - Patient now concerned about pineapple as he had oral irritation/swelling requiring IM epi. Milder symptoms with kiwi treated with antihistamines only. Bromelain likely cause of oral irritation.  Advised avoidance due to severity. Continue to avoid peanuts, tree nuts, coconuts, beans, cherries.  Continue to avoid milk, eggs, corn, pineapple, kiwi.  We can consider labwork in the future.  For mild symptoms you can take over the counter antihistamines (zyrtec 10mg  to 20mg )  and monitor symptoms closely.  If symptoms worsen or if you have severe symptoms including breathing issues, throat closure, significant swelling, whole body hives, severe diarrhea  and vomiting, lightheadedness then use epinephrine  and seek immediate medical care afterwards. Emergency action plan in place.    Eosinophilic esophagitis Past history - EOE confirmed by endoscopy with 35 eosinophils/HPF. Symptoms improved with diet and Pulmicort  slurry BID. Used to be on PPI as well. Interim history - doing much better with Dupixent . Repeat scope showed no eos (08/03/24). Continue to see GI as scheduled. Continue Dupixent  injections weekly at hom Assessment and Plan              No follow-ups on file.  No orders of the defined types were placed in this encounter.  Lab Orders  No laboratory test(s) ordered today    Diagnostics: Spirometry:  Tracings reviewed. His effort: {Blank single:19197::Good reproducible efforts.,It was hard to get consistent efforts and there is a question as to whether this reflects a maximal maneuver.,Poor effort, data can not be interpreted.} FVC: ***L FEV1: ***L, ***% predicted FEV1/FVC ratio: ***% Interpretation: {Blank single:19197::Spirometry consistent with mild obstructive disease,Spirometry consistent with moderate obstructive disease,Spirometry consistent with severe obstructive disease,Spirometry consistent with possible restrictive disease,Spirometry consistent with mixed obstructive and restrictive disease,Spirometry uninterpretable due to technique,Spirometry consistent with normal pattern,No overt abnormalities noted given today's efforts}.  Please see scanned spirometry results for details.  Skin Testing: {Blank single:19197::Select foods,Environmental allergy  panel,Environmental allergy  panel and select foods,Food allergy  panel,None,Deferred due to recent antihistamines use}. *** Results discussed with patient/family.   Medication List:  Current Outpatient Medications  Medication Sig Dispense Refill   albuterol  (VENTOLIN  HFA) 108 (90 Base) MCG/ACT inhaler INHALE 2 PUFFS BY MOUTH EVERY  4 TO 6 HOURS AS NEEDED 18 g 0   ascorbic acid (VITAMIN C) 500 MG tablet Take 500 mg by mouth daily.     aspirin 81 MG tablet Take 81 mg by mouth daily.     bethanechol (URECHOLINE) 10 MG tablet      BREO ELLIPTA  100-25 MCG/ACT AEPB INHALE 1 PUFF INTO THE LUNGS DAILY. RINSE MOUTH AFTER USE 180 each 1   COAGUCHEK LANCETS MISC 1 each by Does not apply route as directed. 200 each 1   DHA-PHOSPHATIDYLSERINE PO Take 500 mg by mouth.     dicyclomine (BENTYL) 10 MG capsule      dupilumab  (DUPIXENT ) 300 MG/2ML prefilled syringe Inject 300 mg into the skin every 7 (seven) days. 8 mL 11   EPINEPHrine  0.3 mg/0.3 mL IJ SOAJ injection Inject 0.3 mg into the muscle as needed for anaphylaxis. 2 each 1   fish oil-omega-3 fatty acids 1000 MG capsule Take 1 g by mouth daily.     fluticasone  (FLONASE ) 50 MCG/ACT nasal spray one spray by Both Nostrils route as needed.     iron  polysaccharides (NIFEREX) 150 MG capsule TAKE 1 CAPSULE BY MOUTH EVERY DAY 30 capsule 0   L-Methylfolate-Algae (DEPLIN 15) 15-90.314 MG CAPS Take by mouth.     montelukast  (SINGULAIR ) 10 MG tablet TAKE 1 TABLET BY MOUTH EVERYDAY AT BEDTIME 90 tablet 1   Multiple Vitamin (MULTIVITAMIN) tablet Take 1 tablet by mouth every morning.     Multiple Vitamins-Minerals (ONE-A-DAY MENS 50+ ADVANTAGE PO) Take by mouth daily.     ondansetron  (ZOFRAN -ODT) 8 MG disintegrating tablet Take by mouth.     OVER THE COUNTER MEDICATION      phenazopyridine (PYRIDIUM) 95 MG tablet Take by mouth.     Plecanatide (TRULANCE  PO) Take by mouth.     Prucalopride Succinate 2 MG TABS      PT/INR Test (COAGUCHEK PT TEST) STRP 1 each by In Vitro route as directed. 48 each 1   PT/INR Testing Monitor (COAGUCHEK XS PLUS SYSTEM) KIT 1 each by Does not apply route as directed. 1 kit 0   ranolazine (RANEXA) 500 MG 12 hr tablet Take 500 mg by mouth 2 (two) times daily.     REPATHA SURECLICK 140 MG/ML SOAJ Inject into the skin.     S-Adenosylmethionine  (SAM-E) 400 MG TABS Take 1 tablet by mouth daily.     St Johns Wort 300 MG CAPS Take 300 mg by mouth 2 (two) times daily.     sucralfate (CARAFATE) 1 g tablet      tadalafil  (CIALIS ) 5 MG tablet Take 5 mg by mouth daily.     Thiamine HCl (B-1) 100 MG TABS Take by mouth.     Tyrosine 500 MG TABS Take 1 tablet by mouth every evening.     VOQUEZNA 20 MG TABS Take by mouth.     warfarin (COUMADIN ) 10 MG tablet TAKE 1 TABLET(10 MG) BY MOUTH DAILY 90 tablet 0   warfarin (COUMADIN ) 2 MG tablet TAKE 1 TABLET BY MOUTH DAILY 30 tablet 2   warfarin (COUMADIN ) 5 MG tablet TAKE 1 TABLET BY MOUTH DAILY 90 tablet 1   No current facility-administered medications for this visit.   Allergies: Allergies[1] I reviewed his past medical history, social history, family history, and environmental history and no significant changes have been reported from his previous visit.  Review of Systems  Constitutional:  Negative for appetite change, chills, fever and unexpected weight change.  HENT:  Positive for congestion and rhinorrhea.   Eyes:  Negative for itching.  Respiratory:  Negative for cough, chest tightness, shortness of breath and wheezing.   Cardiovascular:  Negative for chest pain.  Gastrointestinal:  Negative for abdominal pain, nausea and vomiting.  Genitourinary:  Negative for difficulty urinating.  Skin:  Negative for rash.  Allergic/Immunologic: Positive for environmental allergies and food allergies.  Neurological:  Positive for dizziness. Negative for headaches.    Objective: BP 126/70   Pulse 71   Temp 98.1 F (36.7 C) (Temporal)   Resp 18   Wt 154 lb 12.8 oz (70.2 kg)   SpO2 99%   BMI 24.99 kg/m  Body mass index is 24.99 kg/m. Physical Exam Vitals and nursing note reviewed.  Constitutional:      Appearance: Normal appearance. He is well-developed.  HENT:     Head: Normocephalic and atraumatic.     Right Ear: Tympanic membrane and external ear normal.     Left Ear:  Tympanic membrane and external ear normal.     Nose:     Comments: Right nares irritation    Mouth/Throat:     Mouth: Mucous membranes are moist.     Pharynx: Oropharynx is clear.  Eyes:     Conjunctiva/sclera: Conjunctivae normal.  Cardiovascular:     Rate and Rhythm: Normal rate and regular rhythm.     Heart sounds: Normal heart sounds. No murmur heard.    No friction rub. No gallop.  Pulmonary:     Effort: Pulmonary effort is normal.     Breath sounds: Normal breath sounds. No wheezing, rhonchi or rales.  Musculoskeletal:     Cervical back: Neck supple.  Skin:    General: Skin is warm.     Findings: No rash.  Neurological:     Mental Status: He is alert and oriented to person, place, and time.  Psychiatric:        Behavior: Behavior normal.    Previous notes and tests were reviewed. The plan was reviewed with the patient/family, and all questions/concerned were addressed.  It was my pleasure to see Haydan today and participate in his care. Please feel free to contact me with any questions or concerns.  Sincerely,  Orlan Cramp, DO Allergy  & Immunology  [facility]  [place] office: [phone] [facility] office: [phone]      [1] Allergies Allergen Reactions   Benzodiazepines Other (See Comments)    Causes pt to get violent    Contrast Media [Iodinated Contrast Media] Hives   Iodine Anaphylaxis   Other Anaphylaxis    FRUITS FROM TREES.    Pectin Anaphylaxis   Shellfish Allergy  Anaphylaxis   Sulfa Antibiotics Anaphylaxis   Diazepam  Other (See Comments)    Headache, strange behavior, memory loss   Lactose Intolerance (Gi) Nausea And Vomiting  "

## 2024-11-18 NOTE — Patient Instructions (Addendum)
 Rule out MCAS We discussed that Mast cell activation syndrome is a hard diagnosis to make.  In simplified terms it involves -acute symptoms flare consistent with mast cell activation (typically involving 2 organ systems) -ruling out other causes of the symptoms (asthma, chronic hives, etc) -proving that mast cells are involved. This can either be with an elevated tryptase or a significant shift in tryptase during a flare or with targeting mast cell mediators.  -if indeed mast cells are the culprit, then there should be clinical benefit from targeting mast cell mediators  For reference: a normal median tryptase is about 5ng/mL so levels less than this makes MCAS highly unlikely  Start zyrtec (cetirizine) 10mg  OR allegra (fexofenadine) 180mg  twice a day. If symptoms are not controlled or causes drowsiness let us  know. Start Pepcid  (famotidine ) 20mg  twice a day.  Get labs when you are having an episode  GI symptoms Take cromolyn  5mL before meals and before bedtime. Try low histamine diet - handout given.  Food allergies Continue to avoid peanuts, tree nuts, coconuts, beans, cherries.  Continue to avoid milk, eggs, corn, pineapple, kiwi.  Get labs For mild symptoms you can take over the counter antihistamines (zyrtec 10mg  to 20mg ) and monitor symptoms closely.  If symptoms worsen or if you have severe symptoms including breathing issues, throat closure, significant swelling, whole body hives, severe diarrhea and vomiting, lightheadedness then use epinephrine  and seek immediate medical care afterwards. Emergency action plan in place.   Eosinophilic esophagitis Continue to see GI as scheduled. Continue Dupixent  injections weekly at home.   Environmental allergies Continue Singulair  (montelukast ) 10mg  daily at night. Use Flonase  (fluticasone ) nasal spray 1-2 sprays per nostril once a day as needed for nasal congestion.  Nasal saline spray (i.e., Simply Saline) or nasal saline lavage  (i.e., NeilMed) is recommended as needed and prior to medicated nasal sprays. Use azelastine nasal spray 1-2 sprays per nostril twice a day as needed for runny nose/drainage.  Nose Bleeds: Nosebleeds are very common.  Site of the bleeding is typically on the septum or at the very front of the nose.  Some of the more common causes are from trauma, inflammation or medication induced. Pinch both nostrils while leaning forward for at least 5 minutes before checking to see if the bleeding has stopped. If bleeding is not controlled within 5-10 minutes apply a cotton ball soaked with oxymetazoline (Afrin) to the bleeding nostril for a few seconds.  Preventative treatment: Apply saline nasal gel in each nostril twice a day for 2 weeks to allow the nasal mucosa to heal Consider using a humidifier in the winter Try to keep your blood pressure as normal as possible (120/80)   Asthma Daily controller medication(s): Breo 100mcg 1 puff once a day and rinse mouth after each use.  May use albuterol  rescue inhaler 2 puffs every 4 to 6 hours as needed for shortness of breath, chest tightness, coughing, and wheezing.  Monitor frequency of use - if you need to use it more than twice per week on a consistent basis let us  know.  Breathing control goals:  Full participation in all desired activities (may need albuterol  before activity) Albuterol  use two times or less a week on average (not counting use with activity) Cough interfering with sleep two times or less a month Oral steroids no more than once a year No hospitalizations   Return in about 2 months (around 01/16/2025). Or sooner if needed.

## 2024-11-19 ENCOUNTER — Other Ambulatory Visit: Payer: Self-pay

## 2024-11-19 ENCOUNTER — Encounter: Payer: Self-pay | Admitting: Allergy

## 2024-11-22 ENCOUNTER — Other Ambulatory Visit: Payer: Self-pay

## 2024-11-23 ENCOUNTER — Other Ambulatory Visit: Payer: Self-pay

## 2024-11-24 ENCOUNTER — Telehealth: Payer: Self-pay

## 2024-11-24 NOTE — Telephone Encounter (Signed)
 Dupixent  showing copay of $1353.09

## 2024-11-25 ENCOUNTER — Other Ambulatory Visit (HOSPITAL_COMMUNITY): Payer: Self-pay

## 2024-11-26 ENCOUNTER — Other Ambulatory Visit: Payer: Self-pay

## 2024-11-29 ENCOUNTER — Other Ambulatory Visit (HOSPITAL_COMMUNITY): Payer: Self-pay

## 2024-12-01 ENCOUNTER — Other Ambulatory Visit (HOSPITAL_COMMUNITY): Payer: Self-pay

## 2024-12-02 ENCOUNTER — Other Ambulatory Visit: Payer: Self-pay

## 2024-12-02 ENCOUNTER — Other Ambulatory Visit (HOSPITAL_COMMUNITY): Payer: Self-pay

## 2024-12-02 NOTE — Telephone Encounter (Signed)
 Patient doesn't qualify for PAP and paid OOP last year to meet his OOP max

## 2024-12-02 NOTE — Progress Notes (Signed)
 Specialty Pharmacy Refill Coordination Note  Frederick Payne is a 64 y.o. male contacted today regarding refills of specialty medication(s) Dupilumab  (Dupixent )   Patient requested Delivery   Delivery date: 12/07/24   Verified address: [address]  [place] [address]   Medication will be filled on: 12/06/24

## 2024-12-02 NOTE — Progress Notes (Signed)
 Benefits investigation completed. Patient is aware of their copay of $1349.09 and provided credit card information.

## 2025-01-20 ENCOUNTER — Ambulatory Visit: Admitting: Allergy
